# Patient Record
Sex: Male | Born: 1955
Health system: Southern US, Community
[De-identification: ages and names within clinical notes are randomized; demographics above are authoritative.]

## PROBLEM LIST (undated history)

## (undated) DIAGNOSIS — N401 Enlarged prostate with lower urinary tract symptoms: Secondary | ICD-10-CM

## (undated) DIAGNOSIS — F1011 Alcohol abuse, in remission: Secondary | ICD-10-CM

## (undated) DIAGNOSIS — D649 Anemia, unspecified: Secondary | ICD-10-CM

## (undated) DIAGNOSIS — Z87898 Personal history of other specified conditions: Secondary | ICD-10-CM

## (undated) DIAGNOSIS — F209 Schizophrenia, unspecified: Secondary | ICD-10-CM

## (undated) DIAGNOSIS — Z8679 Personal history of other diseases of the circulatory system: Secondary | ICD-10-CM

---

## 2000-03-21 ENCOUNTER — Emergency Department (HOSPITAL_COMMUNITY): Admission: EM | Admit: 2000-03-21 | Discharge: 2000-03-21 | Payer: Self-pay | Admitting: Emergency Medicine

## 2000-03-22 ENCOUNTER — Emergency Department (HOSPITAL_COMMUNITY): Admission: EM | Admit: 2000-03-22 | Discharge: 2000-03-22 | Payer: Self-pay | Admitting: Emergency Medicine

## 2010-03-20 ENCOUNTER — Emergency Department (HOSPITAL_COMMUNITY)
Admission: EM | Admit: 2010-03-20 | Discharge: 2010-03-21 | Disposition: A | Discharge status: Other Acute Inpatient Hospital | Payer: Self-pay | Source: Home / Self Care | Admitting: Emergency Medicine

## 2010-03-21 ENCOUNTER — Inpatient Hospital Stay (HOSPITAL_COMMUNITY): Admission: EM | Admit: 2010-03-21 | Discharge: 2010-03-25 | Payer: Self-pay | Admitting: Psychiatry

## 2010-03-21 ENCOUNTER — Ambulatory Visit: Payer: Self-pay | Admitting: Psychiatry

## 2010-05-08 ENCOUNTER — Emergency Department (HOSPITAL_COMMUNITY)
Admission: EM | Admit: 2010-05-08 | Discharge: 2010-05-08 | Payer: Self-pay | Source: Home / Self Care | Admitting: Emergency Medicine

## 2010-09-10 LAB — URINALYSIS, ROUTINE W REFLEX MICROSCOPIC
Bilirubin Urine: NEGATIVE
Glucose, UA: NEGATIVE mg/dL
Hgb urine dipstick: NEGATIVE
Ketones, ur: NEGATIVE mg/dL
Nitrite: NEGATIVE
Specific Gravity, Urine: 1.021 (ref 1.005–1.030)
pH: 5 (ref 5.0–8.0)

## 2010-09-10 LAB — BASIC METABOLIC PANEL
BUN: 14 mg/dL (ref 6–23)
Calcium: 9.6 mg/dL (ref 8.4–10.5)
GFR calc non Af Amer: >60 mL/min (ref >60)
Potassium: 4.2 mEq/L (ref 3.5–5.1)
Sodium: 140 mEq/L (ref 135–145)

## 2010-09-10 LAB — RAPID URINE DRUG SCREEN, HOSP PERFORMED
Benzodiazepines: NOT DETECTED
Cocaine: NOT DETECTED
Opiates: NOT DETECTED

## 2010-09-10 LAB — CBC
HCT: 38 % — ABNORMAL LOW (ref 39.0–52.0)
Hemoglobin: 13.4 g/dL (ref 13.0–17.0)
RDW: 12.8 % (ref 11.5–15.5)
WBC: 5.3 10*3/uL (ref 4.0–10.5)

## 2010-09-10 LAB — DIFFERENTIAL
Basophils Absolute: 0 10*3/uL (ref 0.0–0.1)
Basophils Relative: 0 % (ref 0–1)
Lymphocytes Relative: 40 % (ref 12–46)
Monocytes Absolute: 0.3 10*3/uL (ref 0.1–1.0)
Neutro Abs: 2.8 10*3/uL (ref 1.7–7.7)
Neutrophils Relative %: 53 % (ref 43–77)

## 2010-09-10 LAB — TRICYCLICS SCREEN, URINE: TCA Scrn: NOT DETECTED

## 2010-09-10 LAB — ETHANOL: Alcohol, Ethyl (B): <5 mg/dL (ref 0–10)

## 2010-09-12 LAB — COMPREHENSIVE METABOLIC PANEL
ALT: 27 U/L (ref 0–53)
AST: 50 U/L — ABNORMAL HIGH (ref 0–37)
CO2: 29 mEq/L (ref 19–32)
Calcium: 8.9 mg/dL (ref 8.4–10.5)
Creatinine, Ser: 1.28 mg/dL (ref 0.4–1.5)
GFR calc Af Amer: >60 mL/min (ref >60)
GFR calc non Af Amer: 59 mL/min — ABNORMAL LOW (ref >60)
Sodium: 142 mEq/L (ref 135–145)
Total Protein: 6.8 g/dL (ref 6.0–8.3)

## 2010-09-12 LAB — RAPID URINE DRUG SCREEN, HOSP PERFORMED
Amphetamines: NOT DETECTED
Benzodiazepines: NOT DETECTED
Cocaine: NOT DETECTED
Opiates: NOT DETECTED
Tetrahydrocannabinol: NOT DETECTED

## 2010-09-12 LAB — CBC
Hemoglobin: 13.2 g/dL (ref 13.0–17.0)
MCH: 32.1 pg (ref 26.0–34.0)
MCHC: 33.7 g/dL (ref 30.0–36.0)
Platelets: 169 10*3/uL (ref 150–400)
RDW: 12.4 % (ref 11.5–15.5)

## 2010-09-12 LAB — ETHANOL: Alcohol, Ethyl (B): <5 mg/dL (ref 0–10)

## 2010-09-12 LAB — DIFFERENTIAL
Eosinophils Relative: 1 % (ref 0–5)
Lymphocytes Relative: 38 % (ref 12–46)
Lymphs Abs: 2 10*3/uL (ref 0.7–4.0)
Monocytes Relative: 10 % (ref 3–12)
Neutrophils Relative %: 51 % (ref 43–77)

## 2015-11-12 DIAGNOSIS — S40011A Contusion of right shoulder, initial encounter: Secondary | ICD-10-CM | POA: Diagnosis not present

## 2015-11-15 DIAGNOSIS — S46011A Strain of muscle(s) and tendon(s) of the rotator cuff of right shoulder, initial encounter: Secondary | ICD-10-CM | POA: Diagnosis not present

## 2015-11-22 DIAGNOSIS — M25511 Pain in right shoulder: Secondary | ICD-10-CM | POA: Diagnosis not present

## 2015-12-03 DIAGNOSIS — M25511 Pain in right shoulder: Secondary | ICD-10-CM | POA: Diagnosis not present

## 2015-12-14 DIAGNOSIS — S46011A Strain of muscle(s) and tendon(s) of the rotator cuff of right shoulder, initial encounter: Secondary | ICD-10-CM | POA: Diagnosis not present

## 2015-12-15 ENCOUNTER — Observation Stay (HOSPITAL_COMMUNITY)
Admission: EM | Admit: 2015-12-15 | Discharge: 2015-12-16 | Disposition: A | Discharge status: Home / Self Care | Payer: Medicare Other | Attending: Cardiology | Admitting: Cardiology

## 2015-12-15 ENCOUNTER — Other Ambulatory Visit: Payer: Self-pay

## 2015-12-15 ENCOUNTER — Emergency Department (HOSPITAL_COMMUNITY): Payer: Medicare Other

## 2015-12-15 ENCOUNTER — Encounter (HOSPITAL_COMMUNITY): Payer: Self-pay | Admitting: Emergency Medicine

## 2015-12-15 DIAGNOSIS — E876 Hypokalemia: Secondary | ICD-10-CM | POA: Insufficient documentation

## 2015-12-15 DIAGNOSIS — F209 Schizophrenia, unspecified: Secondary | ICD-10-CM | POA: Diagnosis not present

## 2015-12-15 DIAGNOSIS — M79601 Pain in right arm: Secondary | ICD-10-CM

## 2015-12-15 DIAGNOSIS — I480 Paroxysmal atrial fibrillation: Secondary | ICD-10-CM | POA: Diagnosis not present

## 2015-12-15 DIAGNOSIS — I471 Supraventricular tachycardia: Principal | ICD-10-CM | POA: Insufficient documentation

## 2015-12-15 DIAGNOSIS — R079 Chest pain, unspecified: Secondary | ICD-10-CM | POA: Diagnosis not present

## 2015-12-15 DIAGNOSIS — Z9181 History of falling: Secondary | ICD-10-CM | POA: Insufficient documentation

## 2015-12-15 LAB — I-STAT CHEM 8, ED
BUN: 15 mg/dL (ref 6–20)
CALCIUM ION: 1.15 mmol/L (ref 1.13–1.30)
CHLORIDE: 102 mmol/L (ref 101–111)
Creatinine, Ser: 1 mg/dL (ref 0.61–1.24)
GLUCOSE: 90 mg/dL (ref 65–99)
HCT: 44 % (ref 39.0–52.0)
Hemoglobin: 15 g/dL (ref 13.0–17.0)
Potassium: 3.5 mmol/L (ref 3.5–5.1)
Sodium: 140 mmol/L (ref 135–145)
TCO2: 27 mmol/L (ref 0–100)

## 2015-12-15 LAB — I-STAT TROPONIN, ED: Troponin i, poc: 0 ng/mL (ref 0.00–0.08)

## 2015-12-15 LAB — CBC
HEMATOCRIT: 39.2 % (ref 39.0–52.0)
Hemoglobin: 13.3 g/dL (ref 13.0–17.0)
MCH: 32.4 pg (ref 26.0–34.0)
MCHC: 33.9 g/dL (ref 30.0–36.0)
MCV: 95.4 fL (ref 78.0–100.0)
Platelets: 165 10*3/uL (ref 150–400)
RBC: 4.11 MIL/uL — ABNORMAL LOW (ref 4.22–5.81)
RDW: 13 % (ref 11.5–15.5)
WBC: 6.4 10*3/uL (ref 4.0–10.5)

## 2015-12-15 LAB — BASIC METABOLIC PANEL
Anion gap: 9 (ref 5–15)
BUN: 13 mg/dL (ref 6–20)
CHLORIDE: 103 mmol/L (ref 101–111)
CO2: 24 mmol/L (ref 22–32)
Calcium: 9.3 mg/dL (ref 8.9–10.3)
Creatinine, Ser: 1.04 mg/dL (ref 0.61–1.24)
GFR calc Af Amer: >60 mL/min (ref >60)
GFR calc non Af Amer: >60 mL/min (ref >60)
GLUCOSE: 91 mg/dL (ref 65–99)
POTASSIUM: 3.4 mmol/L — AB (ref 3.5–5.1)
Sodium: 136 mmol/L (ref 135–145)

## 2015-12-15 LAB — RAPID URINE DRUG SCREEN, HOSP PERFORMED
Amphetamines: NOT DETECTED
BARBITURATES: NOT DETECTED
Benzodiazepines: NOT DETECTED
Cocaine: NOT DETECTED
Opiates: NOT DETECTED
TETRAHYDROCANNABINOL: NOT DETECTED

## 2015-12-15 MED ORDER — ADENOSINE 6 MG/2ML IV SOLN
INTRAVENOUS | Status: AC
  Filled 2015-12-15: qty 6

## 2015-12-15 MED ORDER — METOPROLOL TARTRATE 5 MG/5ML IV SOLN
INTRAVENOUS | Status: AC
  Filled 2015-12-15: qty 5

## 2015-12-15 MED ORDER — DILTIAZEM HCL 25 MG/5ML IV SOLN
20.0000 mg | Freq: Once | INTRAVENOUS | Status: AC
  Administered 2015-12-15: 20 mg via INTRAVENOUS
  Filled 2015-12-15: qty 5

## 2015-12-15 MED ORDER — SODIUM CHLORIDE 0.9 % IV BOLUS (SEPSIS)
500.0000 mL | Freq: Once | INTRAVENOUS | Status: AC
Start: 2015-12-15 — End: 2015-12-15
  Administered 2015-12-15: 500 mL via INTRAVENOUS

## 2015-12-15 MED ORDER — METOPROLOL TARTRATE 5 MG/5ML IV SOLN: 5.0000 mg | Freq: Once | INTRAVENOUS | Status: DC

## 2015-12-15 MED ORDER — METOPROLOL TARTRATE 5 MG/5ML IV SOLN
5.0000 mg | Freq: Once | INTRAVENOUS | Status: AC
  Administered 2015-12-15: 5 mg via INTRAVENOUS

## 2015-12-15 NOTE — ED Notes (Signed)
Pt HR now 182

## 2015-12-15 NOTE — H&P (Addendum)
HPI: Patient is a 60 yo AA M with h/o schizophrenia who presented to the ED this evening with right arm pain after a fall 3 weeks ago (he slipped on his socks).  While in the waiting room he developed sudden onset palpitations and chest pressure.  Vitals revealed a heart rate of 194, but stable BP.  12 lead EKG showed a narrow-complex tachycardia.  Prior to adenosine being pushed, he spontaneously went into atrial fibrillation briefly and then normal sinus rhythm.  He was given IV metoprolol and diltiazem to hopes to maintain normal rhythm.  He had brief episodes of SVT with frequent PVCs and runs of bigeminy.  He was never unstable.  His chest pain and palpations resolved once sinus rhythm was restored. He continues to complain of right forearm pain.  He denies ever having episodes of palpitations of presyncope prior to this evening.  He has never had any cardiac work-up previously. Denies history of illicit drug use.  Quit EtOH in 1989. No new medications.  He fell walking in socks on a hardwood floor ~3 weeks ago.  Denies LOC.  He hurt his shoulder and right forearm.  He saw an orthopedic surgeon recently and had a steroid injection in his shoulder, which provided moderate relief.  His right arm continues to hurt, which is what brought him to the ED this evening.  Review of Systems: as per hpi, otherwise negative  Past medical history: none, per patient, but on chart review he has been hospitalized for schizophrenia in 2011  Home meds: none   No Known Allergies  Social History   Social History  . Marital Status: Married    Spouse Name: N/A  . Number of Children: N/A  . Years of Education: N/A   Occupational History  . Not on file.   Social History Main Topics  . Smoking status: Never Smoker   . Smokeless tobacco: Not on file  . Alcohol Use: Previous EtOH abuse, quite in 1989  . Drug Use: No  . Sexual Activity: Not on file   Other Topics Concern  . Not on file   Social  History Narrative  . No narrative on file    History reviewed. No pertinent family history.  PHYSICAL EXAM: Filed Vitals:   12/15/15 2200 12/15/15 2215  BP: 117/79 108/80  Pulse: 53 54  Temp:    Resp: 21 17   General:  Well appearing. No respiratory difficulty, appears younger than stated age HEENT: normal Neck: supple. no JVD.  Cor: PMI nondisplaced. Regular rate & rhythm. No rubs, gallops or murmurs. Lungs: clear Abdomen: soft, nontender, nondistended. No hepatosplenomegaly. No bruits or masses. Good bowel sounds. Extremities: no cyanosis, clubbing, rash, edema. No obvious deformity or trauma to right upper extremity Neuro: alert & oriented x 3, cranial nerves grossly intact. moves all 4 extremities w/o difficulty. Affect pleasant. 5/5 strength in UE bialterally, sensation intact, 2+ radial pulses bilaterally  ECG: multiple reviewed 1. HR 194, SVT, likely AVNRT, with diffuse ST depressions 2. Atrial fibrillation, hr 142 with frequent PVCs, resolution of ST depressions 3. NSR, normal 4. SVT with runs of bigeminy 5.  NSR with PAC  Results for orders placed or performed during the hospital encounter of 12/15/15 (from the past 24 hour(s))  I-stat troponin, ED     Status: None   Collection Time: 12/15/15  9:21 PM  Result Value Ref Range   Troponin i, poc 0.00 0.00 - 0.08 ng/mL   Comment 3  Basic metabolic panel     Status: Abnormal   Collection Time: 12/15/15  9:26 PM  Result Value Ref Range   Sodium 136 135 - 145 mmol/L   Potassium 3.4 (L) 3.5 - 5.1 mmol/L   Chloride 103 101 - 111 mmol/L   CO2 24 22 - 32 mmol/L   Glucose, Bld 91 65 - 99 mg/dL   BUN 13 6 - 20 mg/dL   Creatinine, Ser 1.611.04 0.61 - 1.24 mg/dL   Calcium 9.3 8.9 - 09.610.3 mg/dL   GFR calc non Af Amer >60 >60 mL/min   GFR calc Af Amer >60 >60 mL/min   Anion gap 9 5 - 15  CBC     Status: Abnormal   Collection Time: 12/15/15  9:26 PM  Result Value Ref Range   WBC 6.4 4.0 - 10.5 K/uL   RBC 4.11 (L) 4.22  - 5.81 MIL/uL   Hemoglobin 13.3 13.0 - 17.0 g/dL   HCT 04.539.2 40.939.0 - 81.152.0 %   MCV 95.4 78.0 - 100.0 fL   MCH 32.4 26.0 - 34.0 pg   MCHC 33.9 30.0 - 36.0 g/dL   RDW 91.413.0 78.211.5 - 95.615.5 %   Platelets 165 150 - 400 K/uL  I-Stat Chem 8, ED     Status: None   Collection Time: 12/15/15  9:41 PM  Result Value Ref Range   Sodium 140 135 - 145 mmol/L   Potassium 3.5 3.5 - 5.1 mmol/L   Chloride 102 101 - 111 mmol/L   BUN 15 6 - 20 mg/dL   Creatinine, Ser 2.131.00 0.61 - 1.24 mg/dL   Glucose, Bld 90 65 - 99 mg/dL   Calcium, Ion 0.861.15 5.781.13 - 1.30 mmol/L   TCO2 27 0 - 100 mmol/L   Hemoglobin 15.0 13.0 - 17.0 g/dL   HCT 46.944.0 62.939.0 - 52.852.0 %   Dg Chest Port 1 View  12/15/2015  CLINICAL DATA:  Chest pain with fluttering sensation in chest. Heart rate 195. EXAM: PORTABLE CHEST 1 VIEW COMPARISON:  None. FINDINGS: The heart size and mediastinal contours are within normal limits. Both lungs are clear. The visualized skeletal structures are unremarkable. IMPRESSION: No active disease. Electronically Signed   By: Burman NievesWilliam  Stevens M.D.   On: 12/15/2015 22:11     ASSESSMENT/PLAN:  1. Paroxsymal SVT, short R-P, possibly AVNRT. -- check TSH, u tox -- perform transthoracic echocardiogram -- trend troponins  -- consider ambulatory monitor and outpatient follow-up after overnight observation  2. Right arm pain s/p fall. Nothing notable on exam, pulses intact, good strength.  Very unlikely to be anginal, likely MSK trauma after fall.  Very low concern for fracture.  CK normal. Patient reports that he follows with an outpatient orthopedic surgeon -- outpatient follow-up, no further inpatient testing  -- acetaminophen PRN  Regular diet  Full code  Observation, cardiology team.

## 2015-12-15 NOTE — ED Provider Notes (Signed)
CSN: [id number]     Arrival date & time 12/15/15  1923 History   None    Chief Complaint  Patient presents with  . Arm Injury  . Tachycardia     (Consider location/radiation/quality/duration/timing/severity/associated sxs/prior Treatment) HPI He presented with right arm pain. He described a fall approximately 2 weeks ago. He reports that the arm has been aching on and off for days at a time. Patient was in triage awaiting evaluation when he approached the window stating he felt like his heart was racing. This is checked and heart rate was found to be significantly elevated. EKG obtained with heart rate of 194. Patient reports he did not have these symptoms when he came to the emergency department. He denies ever having similar symptoms. He denies chest pain or shortness of breath. He does feel that his heart is racing. He reports he was getting pain medications for his right arm. He indicated getting ibuprofen. He denies other medical history. He denies tobacco use, alcohol use or drug use. History reviewed. No pertinent past medical history. History reviewed. No pertinent past surgical history. History reviewed. No pertinent family history. Social History  Substance Use Topics  . Smoking status: Never Smoker   . Smokeless tobacco: None  . Alcohol Use: No    Review of Systems 10 Systems reviewed and are negative for acute change except as noted in the HPI.    Allergies  Review of patient's allergies indicates no known allergies.  Home Medications   Prior to Admission medications   Not on File   BP 108/80 mmHg  Pulse 61  Temp(Src) 98.1 F (36.7 C) (Oral)  Resp 21  Ht  (1.702 m)  Wt 177 lb 3 oz (80.372 kg)  BMI 27.75 kg/m2  SpO2 96% Physical Exam  Constitutional: He is oriented to person, place, and time. He appears well-developed and well-nourished. No distress.  HENT:  Head: Normocephalic and atraumatic.  Mouth/Throat: Oropharynx is clear and moist.  Eyes: EOM  are normal.  Neck: Neck supple.  Cardiovascular: Intact distal pulses.   Extreme tachycardia. No appreciable rub murmur gallop.  Pulmonary/Chest: Effort normal and breath sounds normal. No respiratory distress.  Abdominal: Soft. Bowel sounds are normal. He exhibits no distension. There is no tenderness.  Musculoskeletal: Normal range of motion. He exhibits no edema or tenderness.  No appreciable abnormality to the right upper extremity. No deformity. No erythema. Nose soft Tissue swelling or pain. Range of motion intact  Lower extremities, no peripheral edema no calf tenderness.  Neurological: He is alert and oriented to person, place, and time. He has normal strength. He exhibits normal muscle tone. Coordination normal. GCS eye subscore is 4. GCS verbal subscore is 5. GCS motor subscore is 6.  Skin: Skin is warm, dry and intact.  Psychiatric: He has a normal mood and affect.    ED Course  Procedures (including critical care time) CRITICAL CARE Performed by: Arby Barrette   Total critical care time: 30 minutes  Critical care time was exclusive of separately billable procedures and treating other patients.  Critical care was necessary to treat or prevent imminent or life-threatening deterioration.  Critical care was time spent personally by me on the following activities: development of treatment plan with patient and/or surrogate as well as nursing, discussions with consultants, evaluation of patient's response to treatment, examination of patient, obtaining history from patient or surrogate, ordering and performing treatments and interventions, ordering and review of laboratory studies, ordering and review of radiographic  studies, pulse oximetry and re-evaluation of patient's condition. Labs Review Labs Reviewed  BASIC METABOLIC PANEL - Abnormal; Notable for the following:    Potassium 3.4 (*)    All other components within normal limits  CBC - Abnormal; Notable for the following:     RBC 4.11 (*)    All other components within normal limits  URINE RAPID DRUG SCREEN, HOSP PERFORMED  I-STAT TROPOININ, ED  I-STAT CHEM 8, ED    Imaging Review Dg Chest Port 1 View  12/15/2015  CLINICAL DATA:  Chest pain with fluttering sensation in chest. Heart rate 195. EXAM: PORTABLE CHEST 1 VIEW COMPARISON:  None. FINDINGS: The heart size and mediastinal contours are within normal limits. Both lungs are clear. The visualized skeletal structures are unremarkable. IMPRESSION: No active disease. Electronically Signed   By: Burman NievesWilliam  Stevens M.D.   On: 12/15/2015 22:11   I have personally reviewed and evaluated these images and lab results as part of my medical decision-making.   EKG Interpretation   Date/Time:  Saturday December 15 2015 21:26:19 EDT Ventricular Rate:  176 PR Interval:    QRS Duration: 85 QT Interval:  231 QTC Calculation: 375 R Axis:   39 Text Interpretation:  Supraventricular tachycardia Multiple premature  complexes, vent & supraven Repolarization abnormality, prob rate related  Confirmed by Donnald GarrePfeiffer, MD, Lebron ConnersMarcy [phone]([id number]) on 12/15/2015 11:33:01 PM     4 EKGs interpreted in [place] by M.D. Seldon Barrell.  Consult: Cardiology, evaluated emergency department and admitted. MDM   Final diagnoses:  SVT (supraventricular tachycardia) (HCC)  Paroxysmal atrial fibrillation with rapid ventricular response (HCC)  Right arm pain   Patient first presented with arm pain that was suggestive of musculoskeletal pain. He abruptly developed SVT with rates in the 190s. Patient was brought back to room and placed on a monitor. His rhythm was variable at times he converted to a sinus rhythm. He however would then have accelerating of rhythm and several beats of QRS with appearance of interventricular conduction delay. He then would go on to have variable rates from 140s up to 200. Times this was consistent with atrial fibrillation/flutter. He was initially given Lopressor 5 mg IV while in sinus  rhythm, he did go on to an accelerated rhythm again. A second dose of Lopressor administered. Again he went through several episodes of converting to sinus rhythm and then back to tachydysrhythmia. These were not wide complex. Patient was given a Cardizem 20 mg bolus. He resumed sinus rhythm and has remained in sinus rhythm. During the treatment phase, patient reported he felt a bit of a pricking or boring sensation in his chest. He remained alert and appropriate without diaphoresis or respiratory distress.    Arby BarretteMarcy Shabrea Weldin, MD 12/15/15 [phone]

## 2015-12-15 NOTE — ED Notes (Signed)
Dr. Broadus JohnPfieffer at bedside. Pads placed on patient. Pt AAOX4, in NAD

## 2015-12-15 NOTE — ED Notes (Signed)
Sister Billy DresserConnie has called and wants to stress how much "she believes he is seeking pain medication."

## 2015-12-15 NOTE — ED Notes (Signed)
Pt initially converted to sinus rhythm. Pt now back to SVT at 128

## 2015-12-15 NOTE — ED Notes (Signed)
Patient arrives with complaint of right arm pain. States "it feels like something is scraping around in there". Endorses injury stating that he fell and landed on that arm 2 weeks ago. States he caught himself with an outstretched hand. Additionally states that he has right leg pain too and he believe the pains are connected. Explains that it feels like "something is crawling from my foot to my shoulder then down my arm and then back down to my foot".

## 2015-12-15 NOTE — ED Notes (Signed)
Pt now in Sinus rhythm.

## 2015-12-15 NOTE — ED Notes (Signed)
Patient now represents with complaint of fluttering sensation in chest. HR noted to be 195 at this time. Denies pain, but states he feels like his heart is racing.

## 2015-12-15 NOTE — ED Notes (Signed)
Dr. Broadus JohnPfieffer continues to be at bedside as the pt HR goes from 75 to 190 multiple times.

## 2015-12-16 ENCOUNTER — Other Ambulatory Visit: Payer: Self-pay | Admitting: Physician Assistant

## 2015-12-16 DIAGNOSIS — I471 Supraventricular tachycardia: Secondary | ICD-10-CM

## 2015-12-16 DIAGNOSIS — E876 Hypokalemia: Secondary | ICD-10-CM

## 2015-12-16 LAB — CK: Total CK: 169 U/L (ref 49–397)

## 2015-12-16 LAB — TROPONIN I: Troponin I: <0.03 ng/mL (ref <0.031)

## 2015-12-16 LAB — TSH: TSH: 3.849 u[IU]/mL (ref 0.350–4.500)

## 2015-12-16 MED ORDER — POTASSIUM CHLORIDE CRYS ER 20 MEQ PO TBCR
40.0000 meq | EXTENDED_RELEASE_TABLET | Freq: Once | ORAL | Status: AC
  Administered 2015-12-16: 40 meq via ORAL
  Filled 2015-12-16: qty 2

## 2015-12-16 MED ORDER — ACETAMINOPHEN 325 MG PO TABS: 650.0000 mg | ORAL_TABLET | Freq: Four times a day (QID) | ORAL | Status: DC | PRN

## 2015-12-16 NOTE — Discharge Summary (Signed)
Discharge Summary    Patient ID: Billy Boyd,  MRN: [id number], DOB/AGE: [birth year] 60 y.o.  Admit date: 12/15/2015 Discharge date: 12/16/2015  Primary Care Provider: No primary care provider on file. Primary Cardiologist: Dr. Johney Frameallred  Discharge Diagnoses    Active Problems:   SVT (supraventricular tachycardia) (HCC)   Hypokalemia    Diagnostic Studies/Procedures    N/A _____________   History of Present Illness & Hospital Course    Mr. Billy Boyd is a 60 y/o M with history of schizophrenia and prior alcohol use (quit 1989) who [facility]. He actually presented to the ER with right arm pain after a fall 3 weeks ago (he slipped on his socks), and developed SVT in the waiting room of the ER. He fell walking in socks on a hardwood floor ~3 weeks ago. Denies LOC.He hurt his shoulder and right forearm.He saw an orthopedic surgeon recently and had a steroid injection in his shoulder, which provided moderate relief.He continued to have right arm pain, prompting ER visit.   While in the waiting room he developed sudden onset palpitations and chest pressure.Vitals revealed a heart rate of 194, but stable BP.12 lead EKG showed a narrow-complex tachycardia. Prior to adenosine being pushed, he spontaneously went into atrial fibrillation briefly and then normal sinus rhythm. He was given IV metoprolol and diltiazem to hopes to maintain normal rhythm.He had brief episodes of SVT with frequent PVCs and runs of bigeminy. He was never unstable. His chest pain and palpations resolved once sinus rhythm was restored. With regard to his arm pain, there was nothing notable on exam, pulses intact, good strength, low concern for fracture. CK was normal. UDS negative. TSH wnl. Troponin was negative. His potassium was borderline low at 3.5 then follow-up 3.4. He was given a dose of potassium chloride. He was admitted for further monitoring. He had no further arrhytmhias  overnight. Given that this was the first episode, Dr. Johney FrameAllred did not recommend initiation of long-term medication at this time. He recommended outpatient echo and ETT with follow-up EP PA in 4-6 weeks. I have [facility] a [facility] scheduler requesting these appointments, and our office will call the patient with this information. Dr. Johney FrameAllred has seen and examined the patient today and feels he is stable for discharge. Would also consider repeating BMET in f/u to reassess if low K is chronic process. He was advised to increase potassium rich foods in diet.  He was also advised to f/u PCP for arm pain. Per nursing note, "his sister Junious DresserConnie has called and wants to stress how much "she believes he is seeking pain medication."  Consultants: N/A _____________  Discharge Vitals Blood pressure 123/83, pulse 53, temperature 98.8 F (37.1 C), temperature source Oral, resp. rate 20, height 5\' 7"  (1.702 m), weight 177 lb 3 oz (80.372 kg), SpO2 100 %.  Filed Weights   12/15/15 1936  Weight: 177 lb 3 oz (80.372 kg)    Labs & Radiologic Studies    CBC  Recent Labs  12/15/15 2126 12/15/15 2141  WBC 6.4  --   HGB 13.3 15.0  HCT 39.2 44.0  MCV 95.4  --   PLT 165  --    Basic Metabolic Panel  Recent Labs  12/15/15 2126 12/15/15 2141  NA 136 140  K 3.4* 3.5  CL 103 102  CO2 24  --   GLUCOSE 91 90  BUN 13 15  CREATININE 1.04 1.00  CALCIUM 9.3  --  Cardiac Enzymes  Recent Labs  12/16/15 0020 12/16/15 0553  CKTOTAL 169  --   TROPONINI <0.03 <0.03   Thyroid Function Tests  Recent Labs  12/16/15 0020  TSH 3.849   _____________  Dg Chest Port 1 View  12/15/2015  CLINICAL DATA:  Chest pain with fluttering sensation in chest. Heart rate 195. EXAM: PORTABLE CHEST 1 VIEW COMPARISON:  None. FINDINGS: The heart size and mediastinal contours are within normal limits. Both lungs are clear. The visualized skeletal structures are unremarkable. IMPRESSION: No active  disease. Electronically Signed   By: Burman Nieves M.D.   On: 12/15/2015 22:11   Disposition   Pt is being discharged home today in good condition.  Follow-up Plans & Appointments    Follow-up Information    Follow up with Primary Care Provider.   Why:  Please follow up with your primary doctor for your arm pain.      Follow up with Sheilah Pigeon, PA-C.   Specialty:  Cardiology   Why:  [facility] - Office will call you for your followup appointment. Call office if you have not heard back in 3 days.   Contact information:   [address] [place] [address] [phone]       Follow up with [facility].   Specialty:  Cardiology   Why:  The office will also be calling you to schedule a stress test and ultrasound of your heart.   Contact information:   [address], [address] [place] [address] [phone]     Discharge Instructions    Diet - low sodium heart healthy    Complete by:  As directed      Increase activity slowly    Complete by:  As directed   Your potassium level was borderline low in the hospital. Please increase dietary intake of potassium including bananas, squash, yogurt, white beans, sweet potatoes, leafy greens, and avocados.           Discharge Medications   There are no discharge medications for this patient.    Allergies:  No Known Allergies    Outstanding Labs/Studies   N/a  Duration of Discharge Encounter   Greater than 30 minutes including physician time.  Signed, Laurann Montana PA-C 12/16/2015, 10:32 AM    Hillis Range MD, [facility]

## 2015-12-16 NOTE — Progress Notes (Signed)
   SUBJECTIVE: The patient is doing well today.  At this time, he denies chest pain, shortness of breath, or any new concerns.         OBJECTIVE: Physical Exam: Filed Vitals:   12/15/15 2200 12/15/15 2215 12/15/15 2300 12/16/15 0000  BP: 117/79 108/80  123/83  Pulse: 53 54 61 53  Temp:    98.8 F (37.1 C)  TempSrc:    Oral  Resp: 21 17 21 20   Height:      Weight:      SpO2: 99% 99% 96% 100%    Intake/Output Summary (Last 24 hours) at 12/16/15 0955 Last data filed at 12/16/15 0730  Gross per 24 hour  Intake    740 ml  Output      0 ml  Net    740 ml    Telemetry reveals sinus rhythm  GEN- The patient is well appearing, alert and oriented x 3 today.   Head- normocephalic, atraumatic Eyes-  Sclera clear, conjunctiva pink Ears- hearing intact Oropharynx- clear Neck- supple,  Lungs- Clear to ausculation bilaterally, normal work of breathing Heart- Regular rate and rhythm, no murmurs, rubs or gallops, PMI not laterally displaced GI- soft, NT, ND, + BS Extremities- no clubbing, cyanosis, or edema Skin- no rash or lesion Neuro- strength and sensation are intact  LABS: Basic Metabolic Panel:  Recent Labs  16/03/9605/17/17 2126 12/15/15 2141  NA 136 140  K 3.4* 3.5  CL 103 102  CO2 24  --   GLUCOSE 91 90  BUN 13 15  CREATININE 1.04 1.00  CALCIUM 9.3  --    CBC:  Recent Labs  12/15/15 2126 12/15/15 2141  WBC 6.4  --   HGB 13.3 15.0  HCT 39.2 44.0  MCV 95.4  --   PLT 165  --    Cardiac Enzymes:  Recent Labs  12/16/15 0020 12/16/15 0553  CKTOTAL 169  --   TROPONINI <0.03 <0.03   Thyroid Function Tests:  Recent Labs  12/16/15 0020  TSH 3.849    ASSESSMENT AND PLAN:   1. SVT Short RP SVT First episode Would not initiate long term medicine at this time Outpatient echo Outpatient ETT (without imaging)  Ok to discharge Follow-up with primary care for arm pain (which brought him to ED) Follow-up with Francis Dowseenee Ursuy PA-C in 4-6 weeks   Hillis RangeJames  Shaely Gadberry, MD 12/16/2015 9:55 AM

## 2015-12-16 NOTE — Progress Notes (Signed)
Reviewed d/c instructions with pt. Pt still c/o "something crawling under skin" in R arm. Refusing to leave. MD paged. Will continue to monitor.

## 2016-01-08 ENCOUNTER — Telehealth: Payer: Self-pay | Admitting: Physician Assistant

## 2016-01-08 NOTE — Telephone Encounter (Signed)
Called pt and left message for pt to call back to update pt's Fm and medical Hx.

## 2016-01-17 ENCOUNTER — Emergency Department (HOSPITAL_COMMUNITY)
Admission: EM | Admit: 2016-01-17 | Discharge: 2016-01-17 | Disposition: A | Discharge status: Home / Self Care | Payer: Medicare Other | Attending: Emergency Medicine | Admitting: Emergency Medicine

## 2016-01-17 ENCOUNTER — Encounter (HOSPITAL_COMMUNITY): Payer: Self-pay | Admitting: Emergency Medicine

## 2016-01-17 ENCOUNTER — Emergency Department (HOSPITAL_COMMUNITY): Payer: Medicare Other

## 2016-01-17 DIAGNOSIS — E871 Hypo-osmolality and hyponatremia: Secondary | ICD-10-CM | POA: Diagnosis not present

## 2016-01-17 DIAGNOSIS — R079 Chest pain, unspecified: Secondary | ICD-10-CM | POA: Diagnosis not present

## 2016-01-17 DIAGNOSIS — R1084 Generalized abdominal pain: Secondary | ICD-10-CM | POA: Diagnosis not present

## 2016-01-17 DIAGNOSIS — R0789 Other chest pain: Secondary | ICD-10-CM | POA: Diagnosis not present

## 2016-01-17 LAB — CBC
HCT: 37 % — ABNORMAL LOW (ref 39.0–52.0)
HEMOGLOBIN: 13 g/dL (ref 13.0–17.0)
MCH: 33 pg (ref 26.0–34.0)
MCHC: 35.1 g/dL (ref 30.0–36.0)
MCV: 93.9 fL (ref 78.0–100.0)
PLATELETS: 181 10*3/uL (ref 150–400)
RBC: 3.94 MIL/uL — AB (ref 4.22–5.81)
RDW: 12.1 % (ref 11.5–15.5)
WBC: 5.5 10*3/uL (ref 4.0–10.5)

## 2016-01-17 LAB — BASIC METABOLIC PANEL
ANION GAP: 8 (ref 5–15)
BUN: 9 mg/dL (ref 6–20)
CALCIUM: 9.6 mg/dL (ref 8.9–10.3)
CHLORIDE: 96 mmol/L — AB (ref 101–111)
CO2: 25 mmol/L (ref 22–32)
CREATININE: 1.05 mg/dL (ref 0.61–1.24)
GFR calc non Af Amer: >60 mL/min (ref >60)
Glucose, Bld: 108 mg/dL — ABNORMAL HIGH (ref 65–99)
Potassium: 3.7 mmol/L (ref 3.5–5.1)
SODIUM: 129 mmol/L — AB (ref 135–145)

## 2016-01-17 LAB — I-STAT TROPONIN, ED: TROPONIN I, POC: 0.01 ng/mL (ref 0.00–0.08)

## 2016-01-17 MED ORDER — OMEPRAZOLE 20 MG PO CPDR: 20.0000 mg | DELAYED_RELEASE_CAPSULE | Freq: Every day | ORAL | Status: DC

## 2016-01-17 NOTE — Discharge Instructions (Signed)
Hyponatremia Hyponatremia is when the amount of salt (sodium) in your blood is too low. When sodium levels are low, your cells absorb extra water and they swell. The swelling happens throughout the body, but it mostly affects the brain. CAUSES This condition may be caused by:  Heart, kidney, or liver problems.  Thyroid problems.  Adrenal gland problems.  Metabolic conditions, such as syndrome of inappropriate antidiuretic hormone (SIADH).  Severe vomiting and diarrhea.  Certain medicines or illegal drugs.  Dehydration.  Drinking too much water.  Eating a diet that is low in sodium.  Large burns on your body.  Sweating. RISK FACTORS This condition is more likely to develop in people who:  Have long-term (chronic) kidney disease.  Have heart failure.  Have a medical condition that causes frequent or excessive diarrhea.  Have metabolic conditions, such as Addison disease or SIADH.  Take certain medicines that affect the sodium and fluid balance in the blood. Some of these medicine types include:  Diuretics.  NSAIDs.  Some opioid pain medicines.  Some antidepressants.  Some seizure prevention medicines. SYMPTOMS  Symptoms of this condition include:  Nausea and vomiting.  Confusion.  Lethargy.  Agitation.  Headache.  Seizures.  Unconsciousness.  Appetite loss.  Muscle weakness and cramping.  Feeling weak or light-headed.  Having a rapid heart rate.  Fainting, in severe cases. DIAGNOSIS This condition is diagnosed with a medical history and physical exam. You will also have other tests, including:  Blood tests.  Urine tests. TREATMENT Treatment for this condition depends on the cause. Treatment may include:  Fluids given through an IV tube that is inserted into one of your veins.  Medicines to correct the sodium imbalance. If medicines are causing the condition, the medicines will need to be adjusted.  Limiting water or fluid intake to  get the correct sodium balance. HOME CARE INSTRUCTIONS  Take medicines only as directed by your health care provider. Many medicines can make this condition worse. Talk with your health care provider about any medicines that you are currently taking.  Carefully follow a recommended diet as directed by your health care provider.  Carefully follow instructions from your health care provider about fluid restrictions.  Keep all follow-up visits as directed by your health care provider. This is important.  Do not drink alcohol. SEEK MEDICAL CARE IF:  You develop worsening nausea, fatigue, headache, confusion, or weakness.  Your symptoms go away and then return.  You have problems following the recommended diet. SEEK IMMEDIATE MEDICAL CARE IF:  You have a seizure.  You faint.  You have ongoing diarrhea or vomiting.   This information is not intended to replace advice given to you by your health care provider. Make sure you discuss any questions you have with your health care provider.   Document Released: 06/06/2002 Document Revised: 10/31/2014 Document Reviewed: 07/06/2014 Elsevier Interactive Patient Education 2016 [employer]  Nonspecific Chest Pain  Chest pain can be caused by many different conditions. There is always a chance that your pain could be related to something serious, such as a heart attack or a blood clot in your lungs. Chest pain can also be caused by conditions that are not life-threatening. If you have chest pain, it is very important to follow up with your health care provider. CAUSES  Chest pain can be caused by:  Heartburn.  Pneumonia or bronchitis.  Anxiety or stress.  Inflammation around your heart (pericarditis) or lung (pleuritis or pleurisy).  A blood clot in  your lung.  A collapsed lung (pneumothorax). It can develop suddenly on its own (spontaneous pneumothorax) or from trauma to the chest.  Shingles infection (varicella-zoster  virus).  Heart attack.  Damage to the bones, muscles, and cartilage that make up your chest wall. This can include:  Bruised bones due to injury.  Strained muscles or cartilage due to frequent or repeated coughing or overwork.  Fracture to one or more ribs.  Sore cartilage due to inflammation (costochondritis). RISK FACTORS  Risk factors for chest pain may include:  Activities that increase your risk for trauma or injury to your chest.  Respiratory infections or conditions that cause frequent coughing.  Medical conditions or overeating that can cause heartburn.  Heart disease or family history of heart disease.  Conditions or health behaviors that increase your risk of developing a blood clot.  Having had chicken pox (varicella zoster). SIGNS AND SYMPTOMS Chest pain can feel like:  Burning or tingling on the surface of your chest or deep in your chest.  Crushing, pressure, aching, or squeezing pain.  Dull or sharp pain that is worse when you move, cough, or take a deep breath.  Pain that is also felt in your back, neck, shoulder, or arm, or pain that spreads to any of these areas. Your chest pain may come and go, or it may stay constant. DIAGNOSIS Lab tests or other studies may be needed to find the cause of your pain. Your health care provider may have you take a test called an ambulatory ECG (electrocardiogram). An ECG records your heartbeat patterns at the time the test is performed. You may also have other tests, such as:  Transthoracic echocardiogram (TTE). During echocardiography, sound waves are used to create a picture of all of the heart structures and to look at how blood flows through your heart.  Transesophageal echocardiogram (TEE).This is a more advanced imaging test that obtains images from inside your body. It allows your health care provider to see your heart in finer detail.  Cardiac monitoring. This allows your health care provider to monitor your  heart rate and rhythm in real time.  Holter monitor. This is a portable device that records your heartbeat and can help to diagnose abnormal heartbeats. It allows your health care provider to track your heart activity for several days, if needed.  Stress tests. These can be done through exercise or by taking medicine that makes your heart beat more quickly.  Blood tests.  Imaging tests. TREATMENT  Your treatment depends on what is causing your chest pain. Treatment may include:  Medicines. These may include:  Acid blockers for heartburn.  Anti-inflammatory medicine.  Pain medicine for inflammatory conditions.  Antibiotic medicine, if an infection is present.  Medicines to dissolve blood clots.  Medicines to treat coronary artery disease.  Supportive care for conditions that do not require medicines. This may include:  Resting.  Applying heat or cold packs to injured areas.  Limiting activities until pain decreases. HOME CARE INSTRUCTIONS  If you were prescribed an antibiotic medicine, finish it all even if you start to feel better.  Avoid any activities that bring on chest pain.  Do not use any tobacco products, including cigarettes, chewing tobacco, or electronic cigarettes. If you need help quitting, ask your health care provider.  Do not drink alcohol.  Take medicines only as directed by your health care provider.  Keep all follow-up visits as directed by your health care provider. This is important. This includes any further  testing if your chest pain does not go away.  If heartburn is the cause for your chest pain, you may be told to keep your head raised (elevated) while sleeping. This reduces the chance that acid will go from your stomach into your esophagus.  Make lifestyle changes as directed by your health care provider. These may include:  Getting regular exercise. Ask your health care provider to suggest some activities that are safe for you.  Eating a  heart-healthy diet. A registered dietitian can help you to learn healthy eating options.  Maintaining a healthy weight.  Managing diabetes, if necessary.  Reducing stress. SEEK MEDICAL CARE IF:  Your chest pain does not go away after treatment.  You have a rash with blisters on your chest.  You have a fever. SEEK IMMEDIATE MEDICAL CARE IF:   Your chest pain is worse.  You have an increasing cough, or you cough up blood.  You have severe abdominal pain.  You have severe weakness.  You faint.  You have chills.  You have sudden, unexplained chest discomfort.  You have sudden, unexplained discomfort in your arms, back, neck, or jaw.  You have shortness of breath at any time.  You suddenly start to sweat, or your skin gets clammy.  You feel nauseous or you vomit.  You suddenly feel light-headed or dizzy.  Your heart begins to beat quickly, or it feels like it is skipping beats. These symptoms may represent a serious problem that is an emergency. Do not wait to see if the symptoms will go away. Get medical help right away. Call your local emergency services (911 in the [place] Do not drive yourself to the hospital.   This information is not intended to replace advice given to you by your health care provider. Make sure you discuss any questions you have with your health care provider.   Document Released: 03/26/2005 Document Revised: 07/07/2014 Document Reviewed: 01/20/2014 Elsevier Interactive Patient Education [employer]

## 2016-01-17 NOTE — ED Notes (Signed)
Pt here for further eval of right sided CP

## 2016-01-17 NOTE — ED Provider Notes (Addendum)
CSN: [id number]     Arrival date & time 01/17/16  1541 History   First MD Initiated Contact with Patient 01/17/16 2202     Chief Complaint  Patient presents with  . Chest Pain      Patient is a 60 y.o. male presenting with chest pain. The history is provided by the patient.  Chest Pain Associated symptoms: abdominal pain   Associated symptoms: no back pain, no dysphagia, no fever and no shortness of breath   Patient presents with chest pain. States had it for around a month. States he'll have pain in the abdomen but will also go up into his chest. States in the morning he has bad breath and has a bad taste in his throat. Around 2 months ago he fell and had a right shoulder injury. He had some physical therapy and injection for. Around one month ago he had some chest pain and was found to have a tachycardia. Does not smoke. Denies drug abuse. States his shoulder is doing better. No further episodes of tachycardia. No diaphoresis. No nausea vomiting. States he's been taking Motrin for the pain along the way.  History reviewed. No pertinent past medical history. History reviewed. No pertinent past surgical history. History reviewed. No pertinent family history. Social History  Substance Use Topics  . Smoking status: Never Smoker   . Smokeless tobacco: None  . Alcohol Use: No    Review of Systems  Constitutional: Negative for fever and appetite change.  HENT: Negative for drooling and trouble swallowing.   Respiratory: Negative for shortness of breath.   Cardiovascular: Positive for chest pain.  Gastrointestinal: Positive for abdominal pain.  Genitourinary: Negative for flank pain.  Musculoskeletal: Negative for back pain.       Right shoulder pain.  Skin: Negative for wound.  Neurological: Negative for light-headedness.  Hematological: Negative for adenopathy.  Psychiatric/Behavioral: Negative for confusion.      Allergies  Review of patient's allergies indicates no known  allergies.  Home Medications   Prior to Admission medications   Medication Sig Start Date End Date Taking? Authorizing Provider  benztropine (COGENTIN) 1 MG tablet Take 1 mg by mouth 2 (two) times daily.   Yes Historical Provider, MD  risperiDONE (RISPERDAL) 1 MG tablet Take 1 mg by mouth 2 (two) times daily.   Yes Historical Provider, MD  omeprazole (PRILOSEC) 20 MG capsule Take 1 capsule (20 mg total) by mouth daily. 01/17/16   Benjiman Core, MD   BP 130/108 mmHg  Pulse 47  Temp(Src) 98.8 F (37.1 C) (Oral)  Resp 14  SpO2 99% Physical Exam  Constitutional: He appears well-developed.  HENT:  Jaw deviated somewhat to right.  Eyes: EOM are normal.  Neck: Neck supple.  Cardiovascular: Normal rate.   Pulmonary/Chest: Effort normal.  Abdominal: Soft. There is no tenderness. There is no rebound.  Musculoskeletal: Normal range of motion. He exhibits no edema.  Neurological: He is alert.  Skin: Skin is warm.    ED Course  Procedures (including critical care time) Labs Review Labs Reviewed  BASIC METABOLIC PANEL - Abnormal; Notable for the following:    Sodium 129 (*)    Chloride 96 (*)    Glucose, Bld 108 (*)    All other components within normal limits  CBC - Abnormal; Notable for the following:    RBC 3.94 (*)    HCT 37.0 (*)    All other components within normal limits  I-STAT TROPOININ, ED    Imaging Review  Dg Chest 2 View  01/17/2016  CLINICAL DATA:  Acute midline chest pain EXAM: CHEST  2 VIEW COMPARISON:  12/15/2015 FINDINGS: The heart size and mediastinal contours are within normal limits. Both lungs are clear. The visualized skeletal structures are unremarkable. IMPRESSION: No active cardiopulmonary disease. Electronically Signed   By: Judie PetitM.  Shick M.D.   On: 01/17/2016 17:15   I have personally reviewed and evaluated these images and lab results as part of my medical decision-making.   EKG Interpretation   Date/Time:  Thursday January 17 2016 15:49:27  EDT Ventricular Rate:  68 PR Interval:  136 QRS Duration: 84 QT Interval:  350 QTC Calculation: 372 R Axis:   36 Text Interpretation:  Normal sinus rhythm Nonspecific T wave abnormality  Abnormal ECG Confirmed by Fayrene FearingJAMES  MD, MARK ([id number]) on 01/17/2016 3:55:51 PM      MDM   Final diagnoses:  Chest pain, unspecified chest pain type  Hyponatremia    Patient with chest pain. Also bad taste in throat and occasional abdominal pain. Doubt cardiac cause. X-ray and lab work reassuring. Mild hyponatremia. May have a component of GERD or gastritis with the NSAIDs he's been taking. Will stop the ibuprofen and do a short course of Prilosec. Will follow-up as needed.    Benjiman CoreNathan Jason Frisbee, MD 01/17/16 2231  After discharge patient stated that he wanted help with the mind mapping machine that was helping monitor his thoughts. Does have a history of some psychiatric disorders apparently is on Seroquel. Does not want to see psychiatry. I don't think he is at the point of being involuntarily committed at this time. Hopefully will follow-up with his psychiatrist.  States that he will return if he would like more help.  Benjiman CoreNathan Quincy Boy, MD 01/17/16 [id number]  Benjiman CoreNathan Breanna Mcdaniel, MD 01/17/16 2248

## 2016-01-27 NOTE — Progress Notes (Deleted)
Cardiology Office Note Date:  01/27/2016  Patient ID:  Billy Boyd, Billy Boyd 12/06/1955, MRN [id number] PCP:  No primary care provider on file.  Cardiologist: Dr. Johney Frame (new)  ***refresh   Chief Complaint: f/u hospital visit  History of Present Illness: Billy Boyd is a 60 y.o. male with history of schizophrenia and prior alcohol use (quit 1989) and new finding of SVT.  He was recently discharged from [facility] 12/16/15 where he originally went with c/o persistant R arm pain after a slip and fall accident.    While in the waiting room he developed sudden onset palpitations and chest pressure.Vitals revealed a heart rate of 194, but stable BP.12 lead EKG showed a narrow-complex tachycardia. Prior to adenosine being pushed, he spontaneously went into atrial fibrillation briefly and then normal sinus rhythm. He was given IV metoprolol and diltiazem to hopes to maintain normal rhythm.He had brief episodes of SVT with frequent PVCs and runs of bigeminy. He was never unstable. His chest pain and palpations resolved once sinus rhythm was restored. With regard to his arm pain, there was nothing notable on exam, pulses intact, good strength, low concern for fracture. CK was normal. UDS negative. TSH wnl. Troponin was negative. His potassium was borderline low at 3.5 then follow-up 3.4. He was given a dose of potassium chloride. He was admitted for further monitoring. He had no further arrhytmhias overnight. Given that this was the first episode, Dr. Johney Frame did not recommend initiation of long-term medication at this time. He recommended outpatient echo and ETT.  There is also mention in the discharge summary : Per nursing note, "his sister Junious Dresser has called and wants to stress how much "she believes he is seeking pain medication."  The patient comes today feeling ***   *** family hx No past medical history on file.  No past surgical history on file.   *** palpitations? *** symptoms? *** echo done?  Stress test? *** recheck BMET  Current Outpatient Prescriptions  Medication Sig Dispense Refill  . benztropine (COGENTIN) 1 MG tablet Take 1 mg by mouth 2 (two) times daily.    Marland Kitchen omeprazole (PRILOSEC) 20 MG capsule Take 1 capsule (20 mg total) by mouth daily. 14 capsule 0  . risperiDONE (RISPERDAL) 1 MG tablet Take 1 mg by mouth 2 (two) times daily.     No current facility-administered medications for this visit.     Allergies:   Review of patient's allergies indicates no known allergies.   Social History:  The patient  reports that he has never smoked. He does not have any smokeless tobacco history on file. He reports that he does not drink alcohol or use drugs.   Family History:  The patient's family history is not on file.  ROS:  Please see the history of present illness.  All other systems are reviewed and otherwise negative.   PHYSICAL EXAM: *** VS:  There were no vitals taken for this visit. BMI: There is no height or weight on file to calculate BMI. Well nourished, well developed, in no acute distress  HEENT: normocephalic, atraumatic  Neck: no JVD, carotid bruits or masses Cardiac:  normal S1, S2; RRR; no significant murmurs, no rubs, or gallops Lungs:  clear to auscultation bilaterally, no wheezing, rhonchi or rales  Abd: soft, nontender MS: no deformity or atrophy Ext: no edema  Skin: warm and dry, no rash Neuro:  No gross deficits appreciated Psych: euthymic mood, full affect   EKG:  Done today and reviewed by myself  shows ***  Recent Labs: 12/16/2015: TSH 3.849 01/17/2016: BUN 9; Creatinine, Ser 1.05; Hemoglobin 13.0; Platelets 181; Potassium 3.7; Sodium 129  No results found for requested labs within last 8760 hours.   CrCl cannot be calculated (Unknown ideal weight.).   Wt Readings from Last 3 Encounters:  12/15/15 177 lb 3 oz (80.4 kg)     Other studies reviewed: Additional studies/records reviewed today include: summarized above  ASSESSMENT AND  PLAN:  1. Short RP SVT     ***Single known episode, no associated near syncope or syncope     If this become recurrent would consider procedure or medical therapy     ***Will schedule echo and stress test as recommended by Dr. Johney Frame in the hospital  Disposition: F/u with ***  Current medicines are reviewed at length with the patient today.  The patient did not have any concerns regarding medicines.***  Signed, Sherrilee Gilles, PA-C 01/27/2016 4:40 PM     [facility] [address] [place] [address] [phone] (office)  [phone] (fax)

## 2016-01-28 ENCOUNTER — Encounter: Payer: Medicare Other | Admitting: Physician Assistant

## 2016-01-28 DIAGNOSIS — R0989 Other specified symptoms and signs involving the circulatory and respiratory systems: Secondary | ICD-10-CM

## 2016-02-06 ENCOUNTER — Encounter: Payer: Self-pay | Admitting: Physician Assistant

## 2016-02-13 DIAGNOSIS — R351 Nocturia: Secondary | ICD-10-CM | POA: Diagnosis not present

## 2016-02-13 DIAGNOSIS — N401 Enlarged prostate with lower urinary tract symptoms: Secondary | ICD-10-CM | POA: Diagnosis not present

## 2016-02-13 DIAGNOSIS — R972 Elevated prostate specific antigen [PSA]: Secondary | ICD-10-CM | POA: Diagnosis not present

## 2016-03-04 ENCOUNTER — Telehealth: Payer: Self-pay | Admitting: *Deleted

## 2016-03-04 NOTE — Telephone Encounter (Signed)
I have left several message for this patient to call our office to schedule there echocardiogram and gxt,I will sent a message to Billy Boyd and remove the order.

## 2017-01-27 ENCOUNTER — Emergency Department (HOSPITAL_COMMUNITY): Payer: Medicare Other

## 2017-01-27 ENCOUNTER — Encounter (HOSPITAL_COMMUNITY): Payer: Self-pay | Admitting: Emergency Medicine

## 2017-01-27 ENCOUNTER — Emergency Department (HOSPITAL_COMMUNITY)
Admission: EM | Admit: 2017-01-27 | Discharge: 2017-01-27 | Disposition: A | Discharge status: Home / Self Care | Payer: Medicare Other | Attending: Emergency Medicine | Admitting: Emergency Medicine

## 2017-01-27 DIAGNOSIS — Z79899 Other long term (current) drug therapy: Secondary | ICD-10-CM | POA: Diagnosis not present

## 2017-01-27 DIAGNOSIS — R42 Dizziness and giddiness: Secondary | ICD-10-CM | POA: Diagnosis not present

## 2017-01-27 DIAGNOSIS — R404 Transient alteration of awareness: Secondary | ICD-10-CM | POA: Diagnosis not present

## 2017-01-27 DIAGNOSIS — I471 Supraventricular tachycardia: Secondary | ICD-10-CM | POA: Diagnosis not present

## 2017-01-27 DIAGNOSIS — R55 Syncope and collapse: Secondary | ICD-10-CM | POA: Diagnosis not present

## 2017-01-27 DIAGNOSIS — S0990XA Unspecified injury of head, initial encounter: Secondary | ICD-10-CM | POA: Diagnosis not present

## 2017-01-27 LAB — URINALYSIS, ROUTINE W REFLEX MICROSCOPIC
Bilirubin Urine: NEGATIVE
Glucose, UA: NEGATIVE mg/dL
HGB URINE DIPSTICK: NEGATIVE
Ketones, ur: NEGATIVE mg/dL
Leukocytes, UA: NEGATIVE
NITRITE: NEGATIVE
PROTEIN: NEGATIVE mg/dL
SPECIFIC GRAVITY, URINE: 1.012 (ref 1.005–1.030)
pH: 6 (ref 5.0–8.0)

## 2017-01-27 LAB — BASIC METABOLIC PANEL
ANION GAP: 5 (ref 5–15)
BUN: 17 mg/dL (ref 6–20)
CALCIUM: 9 mg/dL (ref 8.9–10.3)
CO2: 29 mmol/L (ref 22–32)
Chloride: 103 mmol/L (ref 101–111)
Creatinine, Ser: 1.26 mg/dL — ABNORMAL HIGH (ref 0.61–1.24)
GFR, EST NON AFRICAN AMERICAN: 60 mL/min — AB (ref >60)
GLUCOSE: 127 mg/dL — AB (ref 65–99)
Potassium: 4 mmol/L (ref 3.5–5.1)
Sodium: 137 mmol/L (ref 135–145)

## 2017-01-27 LAB — CBG MONITORING, ED: GLUCOSE-CAPILLARY: 110 mg/dL — AB (ref 65–99)

## 2017-01-27 LAB — CBC
HCT: 40.1 % (ref 39.0–52.0)
HEMOGLOBIN: 14 g/dL (ref 13.0–17.0)
MCH: 33.8 pg (ref 26.0–34.0)
MCHC: 34.9 g/dL (ref 30.0–36.0)
MCV: 96.9 fL (ref 78.0–100.0)
PLATELETS: 166 10*3/uL (ref 150–400)
RBC: 4.14 MIL/uL — ABNORMAL LOW (ref 4.22–5.81)
RDW: 12.8 % (ref 11.5–15.5)
WBC: 5.2 10*3/uL (ref 4.0–10.5)

## 2017-01-27 MED ORDER — SODIUM CHLORIDE 0.9 % IV BOLUS (SEPSIS)
500.0000 mL | Freq: Once | INTRAVENOUS | Status: AC
  Administered 2017-01-27: 500 mL via INTRAVENOUS

## 2017-01-27 NOTE — ED Triage Notes (Signed)
Pt BIB EMS from home. Had syncopal episode tonight while attempting to use the bathroom. For EMS, pt initially diaphoretic with a BP of 70/40 and a HR in the 40s. Received NS en route. BP & HR improved. Alert & oriented throughout. No hx of similar episodes. No pain complaint or obvious injury.

## 2017-01-27 NOTE — ED Notes (Signed)
Pt. Ambulated unassisted in hallway. Pt. States his left ankles hurts a little, but otherwise has no complaints

## 2017-01-27 NOTE — Discharge Instructions (Signed)
We suspect that his passing out is related to having a bowel movement and blood pooling in the legs.  Drink plenty of fluids and eat well. Follow-up with your PCP for re-check in one week.  Return without fail for worsening symptoms, including recurrent passing out, chest pain, difficulty breathing, confusion, difficulty walking, or any  other symptoms concerning to you.

## 2017-01-27 NOTE — ED Notes (Signed)
Pt. Taken off the floor before d/c. Vitals copied from that time.

## 2017-01-27 NOTE — ED Notes (Signed)
Patient transported to CT 

## 2017-01-27 NOTE — ED Provider Notes (Signed)
[facility]-EMER[facility]ENCY DEPT Provider Note   CSN: [id number] Arrival date & time: 01/27/17  0551     History   Chief Complaint Chief Complaint  Patient presents with  . Loss of Consciousness    HPI Billy Boyd is a [age] y.o. male.  The history is provided by the patient.  Loss of Consciousness   This is a new problem. The current episode started 1 to 2 hours ago. The problem occurs rarely. The problem has been resolved. He lost consciousness for a period of less than one minute. The problem is associated with standing up and bowel movements. Associated symptoms include diaphoresis and light-headedness. Pertinent negatives include abdominal pain, back pain, bladder incontinence, bowel incontinence, chest pain, fever, focal sensory loss, focal weakness, headaches, malaise/fatigue, nausea, palpitations and vomiting. He has tried nothing for the symptoms.   61 year old male who presents with syncope. History of schizophrenia and SVT. No prior history of syncope. States that he got up from bed to use the restroom tonight and was feeling dizzy while having a bowel movement. States that when he got up, he passed out. Denies any associating chest pain, abdominal pain, back pain, headaches or neurological complaints, shortness of breath. EMS was called, initially patient seemed sweaty, hypotensive with systolic blood pressure of 70/40, and a heart rate in the 40s. He did receive 500 mL of normal saline. On arrival, patient states that he feels at baseline. He denies any recent illnesses including vomiting or diarrhea, melena or hematochezia, fevers or chills.  History reviewed. No pertinent past medical history.  Patient Active Problem List   Diagnosis Date Noted  . Hypokalemia 12/16/2015  . SVT (supraventricular tachycardia) (HCC) 12/15/2015    History reviewed. No pertinent surgical history.     Home Medications    Prior to Admission medications   Medication Sig Start Date End Date Taking?  Authorizing Provider  benztropine (COGENTIN) 1 MG tablet Take 1 mg by mouth 2 (two) times daily.   Yes [provider]  risperiDONE (RISPERDAL) 1 MG tablet Take 1 mg by mouth 2 (two) times daily.   Yes [provider]  omeprazole (PRILOSEC) 20 MG capsule Take 1 capsule (20 mg total) by mouth daily. Patient not taking: Reported on 01/27/2017 01/17/16   Benjiman CorePickering, Nathan, MD    Family History History reviewed. No pertinent family history.  Social History Social History  Substance Use Topics  . Smoking status: Never Smoker  . Smokeless tobacco: Never Used  . Alcohol use No     Allergies   Patient has no known allergies.   Review of Systems Review of Systems  Constitutional: Positive for diaphoresis. Negative for fever and malaise/fatigue.  Cardiovascular: Positive for syncope. Negative for chest pain and palpitations.  Gastrointestinal: Negative for abdominal pain, bowel incontinence, nausea and vomiting.  Genitourinary: Negative for bladder incontinence.  Musculoskeletal: Negative for back pain.  Neurological: Positive for light-headedness. Negative for focal weakness and headaches.  All other systems reviewed and are negative.    Physical Exam Updated Vital Signs BP 92/62   Pulse (!) 53   Temp 97.7 F (36.5 C) (Oral)   Resp 16   Ht 5\' 7"  (1.702 m)   Wt 81.6 kg (180 lb)   SpO2 100%   BMI 28.19 kg/m   Physical Exam Physical Exam  Nursing note and vitals reviewed. Constitutional: Well developed, well nourished, non-toxic, and in no acute distress Head: Normocephalic and atraumatic.  Mouth/Throat: Oropharynx is clear and moist.  Neck: Normal  range of motion. Neck supple.  Cardiovascular: Normal rate and regular rhythm.   Pulmonary/Chest: Effort normal and breath sounds normal.  Abdominal: Soft. There is no tenderness. There is no rebound and no guarding.  Musculoskeletal: Normal range of motion.  Neurological: Alert, no facial droop, fluent  speech, moves all extremities symmetrically, equal bilateral hand grips, PERRL, sensation to light touch in tact throughout Skin: Skin is warm and dry.  Psychiatric: Cooperative   ED Treatments / Results  Labs (all labs ordered are listed, but only abnormal results are displayed) Labs Reviewed  BASIC METABOLIC PANEL - Abnormal; Notable for the following:       Result Value   Glucose, Bld 127 (*)    Creatinine, Ser 1.26 (*)    GFR calc non Af Amer 60 (*)    All other components within normal limits  CBC - Abnormal; Notable for the following:    RBC 4.14 (*)    All other components within normal limits  CBG MONITORING, ED - Abnormal; Notable for the following:    Glucose-Capillary 110 (*)    All other components within normal limits  URINALYSIS, ROUTINE W REFLEX MICROSCOPIC    EKG  EKG Interpretation  Date/Time:  Tuesday January 27 2017 06:05:28 EDT Ventricular Rate:  65 PR Interval:    QRS Duration: 100 QT Interval:  401 QTC Calculation: 417 R Axis:   61 Text Interpretation:  Sinus rhythm No acute changes Confirmed by Crista Curb [phone]) on 01/27/2017 7:03:33 AM       Radiology Ct Head Wo Contrast  Result Date: 01/27/2017 CLINICAL DATA:  Hit his head during a fall. EXAM: CT HEAD WITHOUT CONTRAST TECHNIQUE: Contiguous axial images were obtained from the base of the skull through the vertex without intravenous contrast. COMPARISON:  CT head dated March 20, 2010. FINDINGS: Brain: No evidence of acute infarction, hemorrhage, hydrocephalus, extra-axial collection or mass lesion/mass effect. Vascular: No hyperdense vessel or unexpected calcification. Skull: Normal. Negative for fracture or focal lesion. Sinuses/Orbits: The bilateral paranasal sinuses and mastoid air cells are clear. The orbits are remarkable. Other: None. IMPRESSION: No acute intracranial abnormality. Electronically Signed   By: Obie Dredge M.D.   On: 01/27/2017 07:32    Procedures Procedures (including  critical care time)  Medications Ordered in ED Medications  sodium chloride 0.9 % bolus 500 mL (500 mLs Intravenous New Bag/Given 01/27/17 0732)     Initial Impression / Assessment and Plan / ED Course  I have reviewed the triage vital signs and the nursing notes.  Pertinent labs & imaging results that were available during my care of the patient were reviewed by me and considered in my medical decision making (see chart for details).     Presents with syncopal episode. Suspect that this related to combination of situational/vasovagal (bearing down with bowel movement) and orthostasis (feeling lightheaded after getting up from bed and toilet). He is well appearing, asymptomatic in the ED. EKG without stigmata of arrhythmia. Blood work with mild AKI, and given additional 500 cc of IVF. No major cardiac history of high risk features of syncope today. Remainder of blood work is reassuring. Felt stable for discharge home. Strict return and follow-up instructions reviewed with patient and his family. They expressed understanding of all discharge instructions and felt comfortable with the plan of care.   Final Clinical Impressions(s) / ED Diagnoses   Final diagnoses:  Syncope and collapse    New Prescriptions New Prescriptions   No medications on file  Lavera GuiseLiu, Imagene Boss Duo, MD 01/27/17 [phone]

## 2017-10-15 DIAGNOSIS — F29 Unspecified psychosis not due to a substance or known physiological condition: Secondary | ICD-10-CM | POA: Diagnosis not present

## 2017-10-21 DIAGNOSIS — F29 Unspecified psychosis not due to a substance or known physiological condition: Secondary | ICD-10-CM | POA: Diagnosis not present

## 2017-12-29 DIAGNOSIS — F209 Schizophrenia, unspecified: Secondary | ICD-10-CM | POA: Diagnosis not present

## 2017-12-29 DIAGNOSIS — Z1389 Encounter for screening for other disorder: Secondary | ICD-10-CM | POA: Diagnosis not present

## 2017-12-29 DIAGNOSIS — N400 Enlarged prostate without lower urinary tract symptoms: Secondary | ICD-10-CM | POA: Diagnosis not present

## 2018-01-29 DIAGNOSIS — N401 Enlarged prostate with lower urinary tract symptoms: Secondary | ICD-10-CM | POA: Diagnosis not present

## 2018-01-29 DIAGNOSIS — R350 Frequency of micturition: Secondary | ICD-10-CM | POA: Diagnosis not present

## 2018-01-29 DIAGNOSIS — R972 Elevated prostate specific antigen [PSA]: Secondary | ICD-10-CM | POA: Diagnosis not present

## 2018-03-30 DIAGNOSIS — R972 Elevated prostate specific antigen [PSA]: Secondary | ICD-10-CM | POA: Diagnosis not present

## 2018-03-30 DIAGNOSIS — N5201 Erectile dysfunction due to arterial insufficiency: Secondary | ICD-10-CM | POA: Diagnosis not present

## 2018-03-30 DIAGNOSIS — R3912 Poor urinary stream: Secondary | ICD-10-CM | POA: Diagnosis not present

## 2018-03-30 DIAGNOSIS — N401 Enlarged prostate with lower urinary tract symptoms: Secondary | ICD-10-CM | POA: Diagnosis not present

## 2018-04-14 DIAGNOSIS — F29 Unspecified psychosis not due to a substance or known physiological condition: Secondary | ICD-10-CM | POA: Diagnosis not present

## 2018-05-10 DIAGNOSIS — F209 Schizophrenia, unspecified: Secondary | ICD-10-CM | POA: Diagnosis not present

## 2018-05-10 DIAGNOSIS — Z0000 Encounter for general adult medical examination without abnormal findings: Secondary | ICD-10-CM | POA: Diagnosis not present

## 2018-05-10 DIAGNOSIS — N400 Enlarged prostate without lower urinary tract symptoms: Secondary | ICD-10-CM | POA: Diagnosis not present

## 2018-05-10 DIAGNOSIS — R7309 Other abnormal glucose: Secondary | ICD-10-CM | POA: Diagnosis not present

## 2018-05-10 DIAGNOSIS — Z136 Encounter for screening for cardiovascular disorders: Secondary | ICD-10-CM | POA: Diagnosis not present

## 2018-05-10 DIAGNOSIS — Z1389 Encounter for screening for other disorder: Secondary | ICD-10-CM | POA: Diagnosis not present

## 2018-05-10 DIAGNOSIS — Z1211 Encounter for screening for malignant neoplasm of colon: Secondary | ICD-10-CM | POA: Diagnosis not present

## 2018-05-10 DIAGNOSIS — Z23 Encounter for immunization: Secondary | ICD-10-CM | POA: Diagnosis not present

## 2018-05-14 DIAGNOSIS — R350 Frequency of micturition: Secondary | ICD-10-CM | POA: Diagnosis not present

## 2018-05-14 DIAGNOSIS — R351 Nocturia: Secondary | ICD-10-CM | POA: Diagnosis not present

## 2018-07-26 DIAGNOSIS — N401 Enlarged prostate with lower urinary tract symptoms: Secondary | ICD-10-CM | POA: Diagnosis not present

## 2018-07-26 DIAGNOSIS — R351 Nocturia: Secondary | ICD-10-CM | POA: Diagnosis not present

## 2018-08-31 DIAGNOSIS — N401 Enlarged prostate with lower urinary tract symptoms: Secondary | ICD-10-CM | POA: Diagnosis not present

## 2018-08-31 DIAGNOSIS — R351 Nocturia: Secondary | ICD-10-CM | POA: Diagnosis not present

## 2018-09-14 DIAGNOSIS — R351 Nocturia: Secondary | ICD-10-CM | POA: Diagnosis not present

## 2018-09-14 DIAGNOSIS — N401 Enlarged prostate with lower urinary tract symptoms: Secondary | ICD-10-CM | POA: Diagnosis not present

## 2018-09-14 DIAGNOSIS — R350 Frequency of micturition: Secondary | ICD-10-CM | POA: Diagnosis not present

## 2018-11-10 DIAGNOSIS — F209 Schizophrenia, unspecified: Secondary | ICD-10-CM | POA: Diagnosis not present

## 2018-11-10 DIAGNOSIS — R972 Elevated prostate specific antigen [PSA]: Secondary | ICD-10-CM | POA: Diagnosis not present

## 2018-11-10 DIAGNOSIS — N400 Enlarged prostate without lower urinary tract symptoms: Secondary | ICD-10-CM | POA: Diagnosis not present

## 2018-11-10 DIAGNOSIS — R7303 Prediabetes: Secondary | ICD-10-CM | POA: Diagnosis not present

## 2018-12-05 IMAGING — CT CT HEAD W/O CM
4 series · 16 of 47 positions shown, 18 images · non-contrast
Comparison: CT head dated March 20, 2010.

CLINICAL DATA: Hit his head during a fall.

EXAM:
CT HEAD WITHOUT CONTRAST
TECHNIQUE: Contiguous axial images were obtained from the base of the skull
through the vertex without intravenous contrast.

[Series 3: head wo · axial · 0.42mm/px · z∈[-102,+14]mm · 7 of 31 slices shown, 9 images]
[im 4/31  brain]
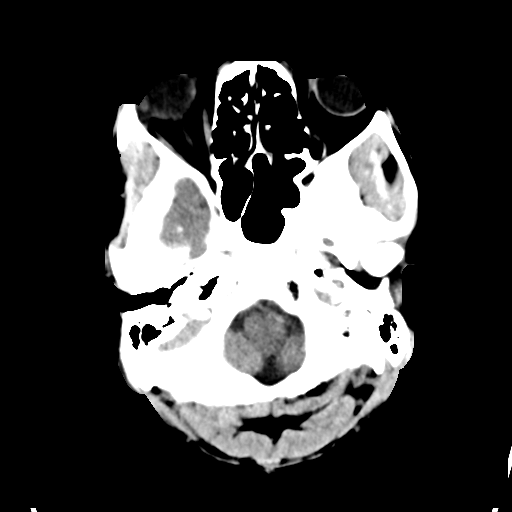
[im 4/31  bone]
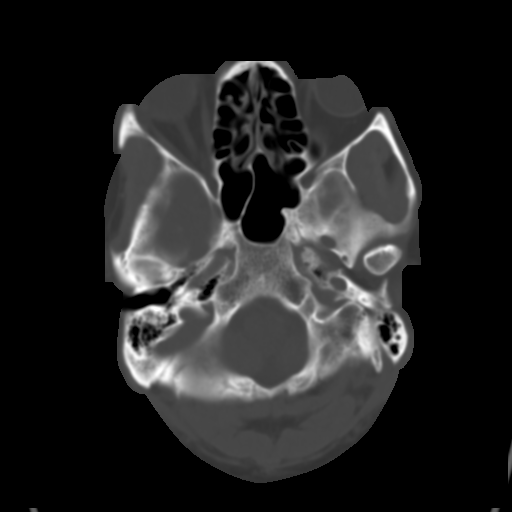
[im 8/31  brain]
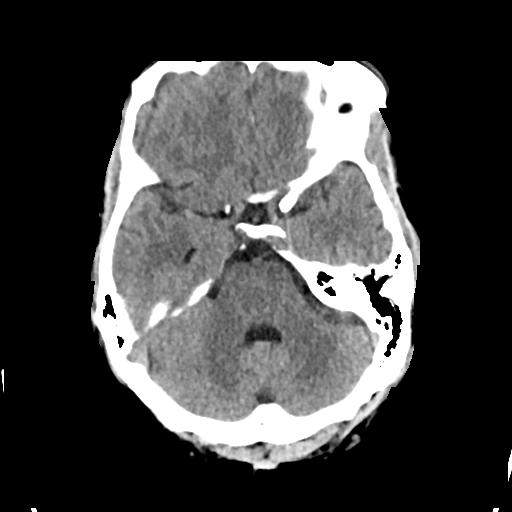
[im 12/31  brain]
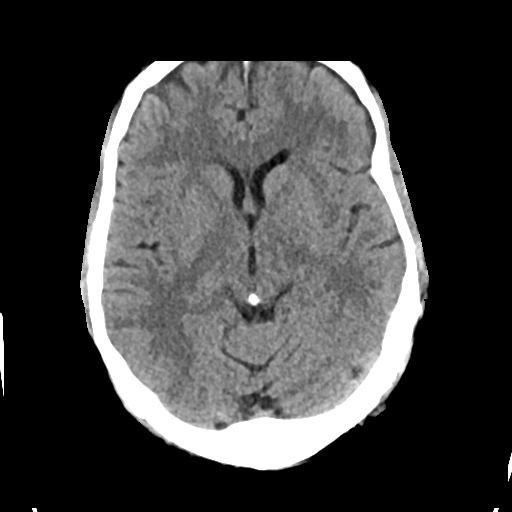
[im 16/31  brain]
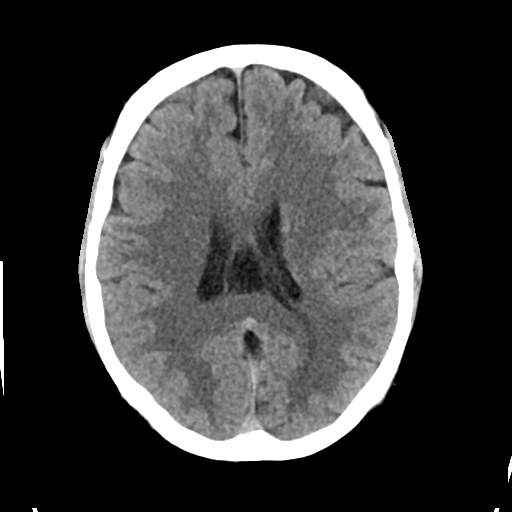
[im 19/31  brain]
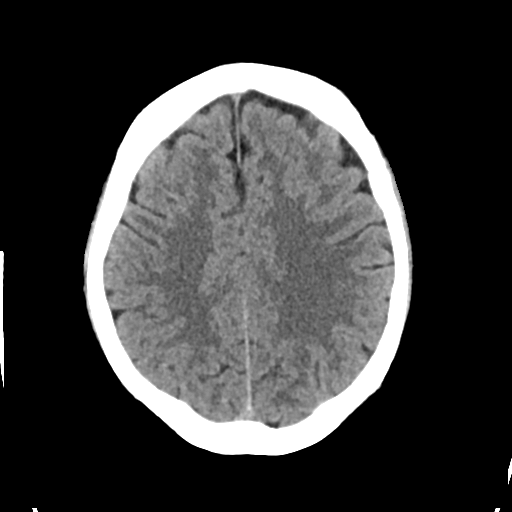
[im 19/31  bone]
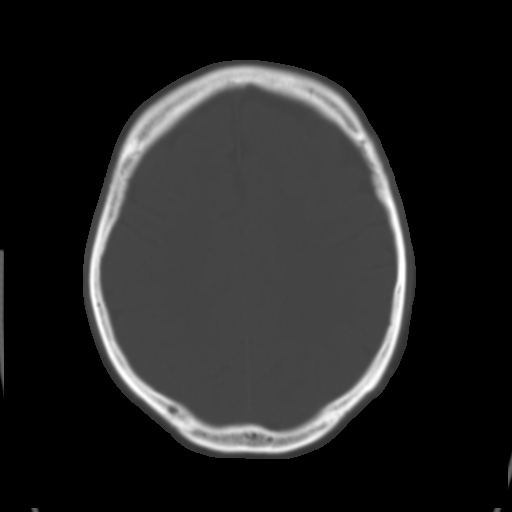
[im 23/31  brain]
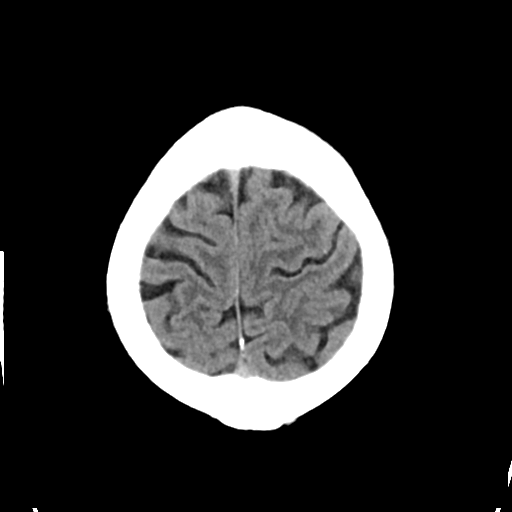
[im 27/31  brain]
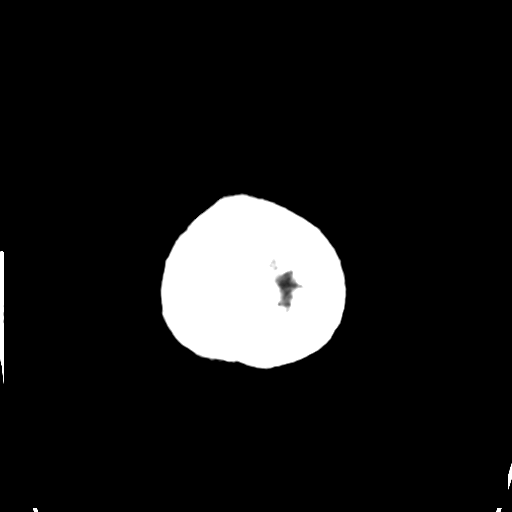

[Series 4: head bone · axial · 0.42mm/px · z∈[-102,-72]mm · 3 of 76 slices shown]
[im 8/76  bone]
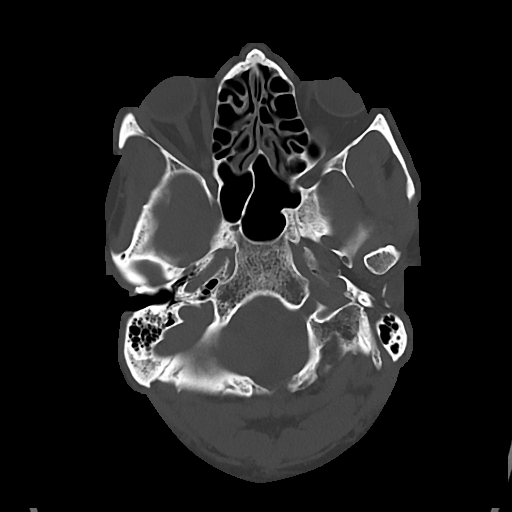
[im 16/76  bone]
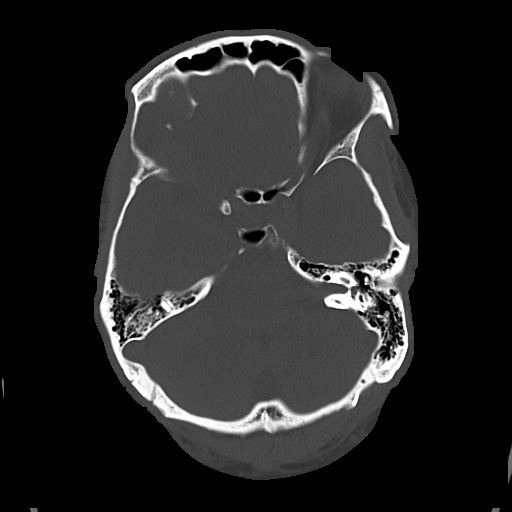
[im 23/76  bone]
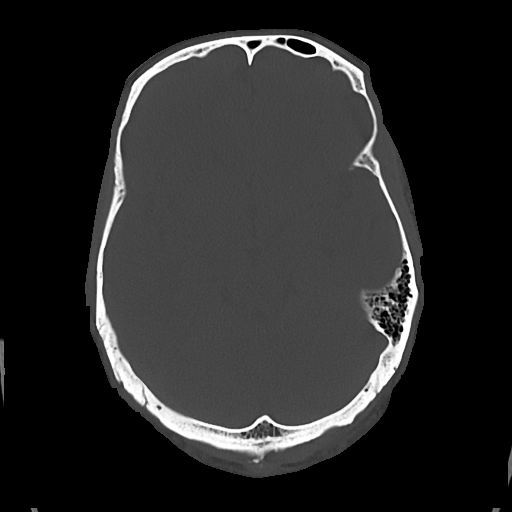

[Series 5: cor soft · coronal · 0.29mm/px · 3 of 68 slices shown]
[im 23/68  brain]
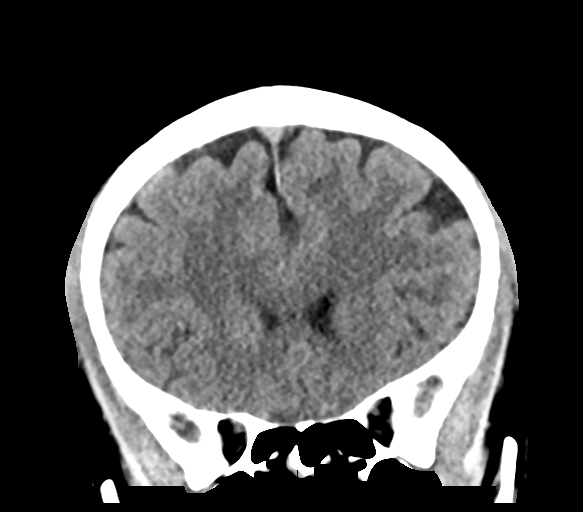
[im 30/68  brain]
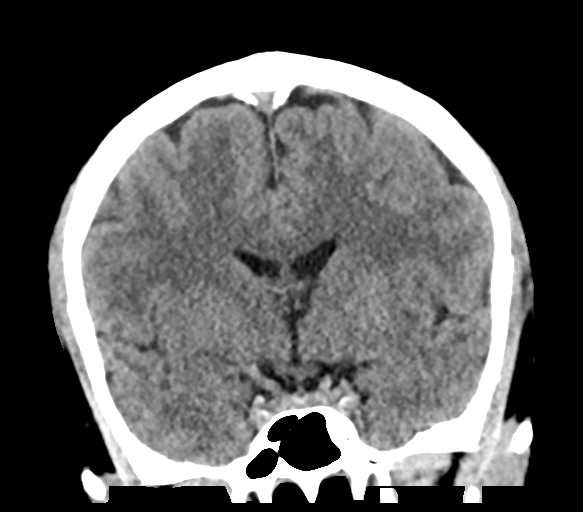
[im 38/68  brain]
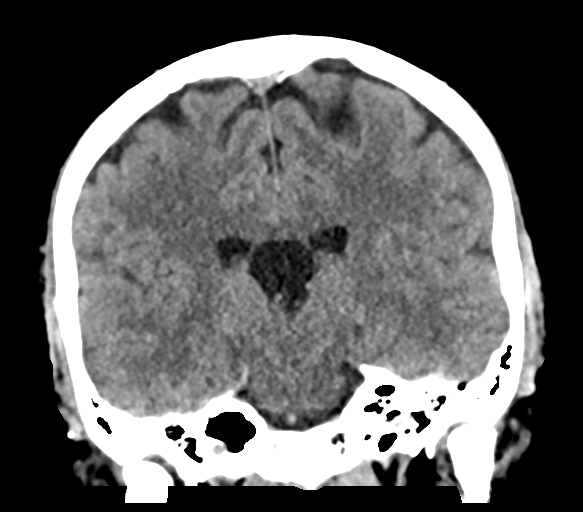

[Series 6: sag soft · sagittal · 0.29mm/px · 3 of 63 slices shown]
[im 23/63  brain]
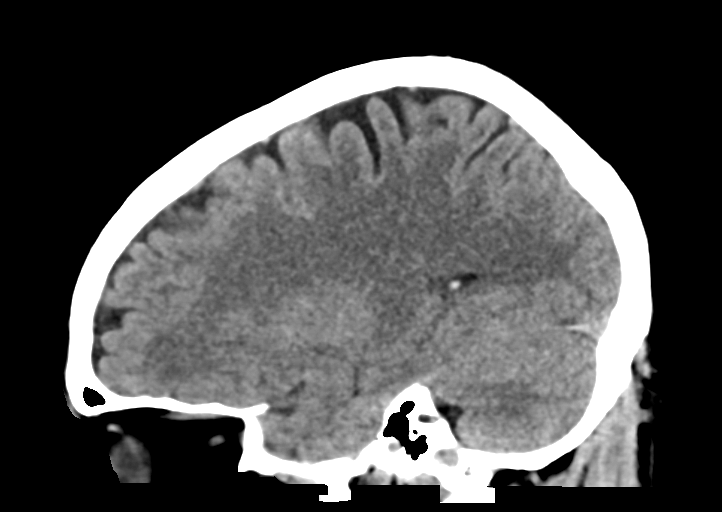
[im 32/63  brain]
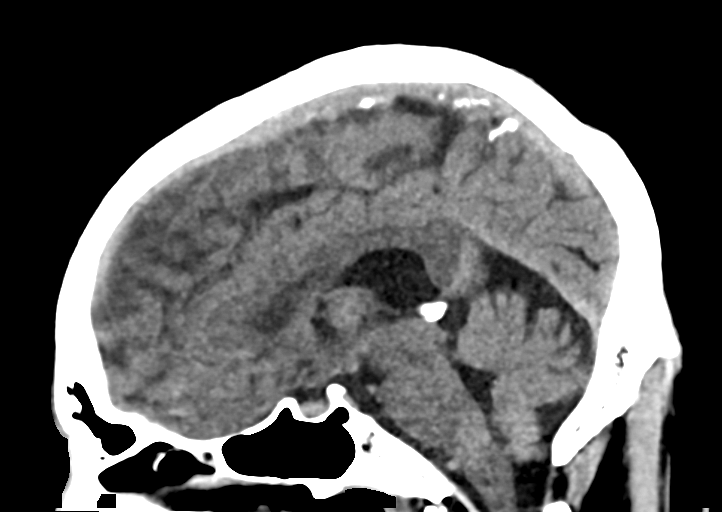
[im 40/63  brain]
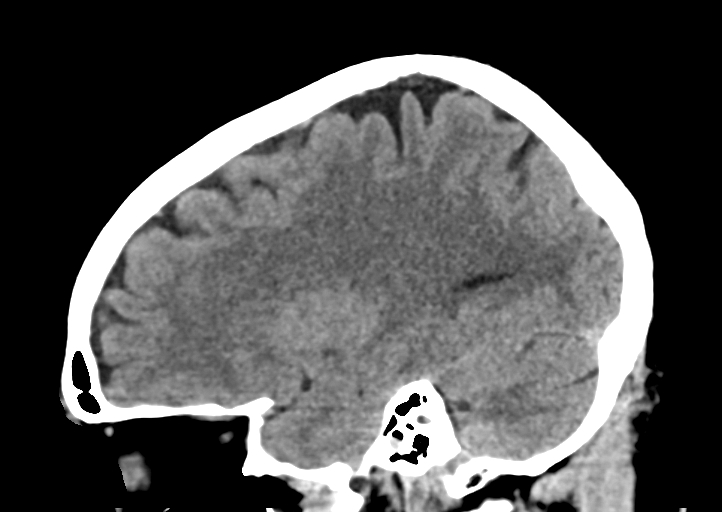

[16 of 47 positions shown; findings below may reference images not displayed]

FINDINGS: Brain: No evidence of acute infarction, hemorrhage, hydrocephalus,
extra-axial collection or mass lesion/mass effect.

Vascular: No hyperdense vessel or unexpected calcification.

Skull: Normal. Negative for fracture or focal lesion.

Sinuses/Orbits: The bilateral paranasal sinuses and mastoid air
cells are clear. The orbits are remarkable.

Other: None.
IMPRESSION: No acute intracranial abnormality.

## 2019-01-13 DIAGNOSIS — N5201 Erectile dysfunction due to arterial insufficiency: Secondary | ICD-10-CM | POA: Diagnosis not present

## 2019-01-13 DIAGNOSIS — R351 Nocturia: Secondary | ICD-10-CM | POA: Diagnosis not present

## 2019-01-13 DIAGNOSIS — N401 Enlarged prostate with lower urinary tract symptoms: Secondary | ICD-10-CM | POA: Diagnosis not present

## 2019-01-19 ENCOUNTER — Other Ambulatory Visit: Payer: Self-pay | Admitting: Urology

## 2019-02-22 DIAGNOSIS — N401 Enlarged prostate with lower urinary tract symptoms: Secondary | ICD-10-CM | POA: Diagnosis not present

## 2019-02-22 DIAGNOSIS — R351 Nocturia: Secondary | ICD-10-CM | POA: Diagnosis not present

## 2019-02-28 ENCOUNTER — Other Ambulatory Visit (HOSPITAL_COMMUNITY)
Admission: RE | Admit: 2019-02-28 | Discharge: 2019-02-28 | Disposition: A | Discharge status: Home / Self Care | Payer: Medicare Other | Source: Ambulatory Visit | Attending: Urology | Admitting: Urology

## 2019-02-28 DIAGNOSIS — Z01812 Encounter for preprocedural laboratory examination: Secondary | ICD-10-CM | POA: Diagnosis not present

## 2019-02-28 DIAGNOSIS — Z20828 Contact with and (suspected) exposure to other viral communicable diseases: Secondary | ICD-10-CM | POA: Insufficient documentation

## 2019-02-28 LAB — SARS CORONAVIRUS 2 (TAT 6-24 HRS): SARS Coronavirus 2: NEGATIVE

## 2019-03-01 ENCOUNTER — Encounter (HOSPITAL_BASED_OUTPATIENT_CLINIC_OR_DEPARTMENT_OTHER): Payer: Self-pay

## 2019-03-02 ENCOUNTER — Encounter (HOSPITAL_BASED_OUTPATIENT_CLINIC_OR_DEPARTMENT_OTHER): Payer: Self-pay | Admitting: *Deleted

## 2019-03-02 NOTE — Progress Notes (Signed)
Unable to reach pt with multiple attempts at times per recording unavailable however this afternoon able to leave message with instructions for tomorrow.  Pt told to be npo after mn and arrive at 1100.  Bring insurance card, photo ID, and medications in original prescription bottles since is has staying the night.  Also, told of visitor restriction policy and [facility] guidelines.   Pt will need medical history and medication list updated in epic.

## 2019-03-03 ENCOUNTER — Observation Stay (HOSPITAL_BASED_OUTPATIENT_CLINIC_OR_DEPARTMENT_OTHER)
Admission: RE | Admit: 2019-03-03 | Discharge: 2019-03-04 | Disposition: A | Discharge status: Home / Self Care | Payer: Medicare Other | Attending: Urology | Admitting: Urology

## 2019-03-03 ENCOUNTER — Encounter (HOSPITAL_BASED_OUTPATIENT_CLINIC_OR_DEPARTMENT_OTHER)
Admission: RE | Disposition: A | Discharge status: Home / Self Care | Payer: Self-pay | Source: Home / Self Care | Attending: Urology

## 2019-03-03 ENCOUNTER — Encounter (HOSPITAL_BASED_OUTPATIENT_CLINIC_OR_DEPARTMENT_OTHER): Payer: Self-pay

## 2019-03-03 ENCOUNTER — Ambulatory Visit (HOSPITAL_BASED_OUTPATIENT_CLINIC_OR_DEPARTMENT_OTHER): Payer: Medicare Other | Admitting: Anesthesiology

## 2019-03-03 ENCOUNTER — Other Ambulatory Visit: Payer: Self-pay | Admitting: Urology

## 2019-03-03 DIAGNOSIS — K219 Gastro-esophageal reflux disease without esophagitis: Secondary | ICD-10-CM | POA: Diagnosis not present

## 2019-03-03 DIAGNOSIS — N401 Enlarged prostate with lower urinary tract symptoms: Secondary | ICD-10-CM | POA: Diagnosis not present

## 2019-03-03 DIAGNOSIS — E876 Hypokalemia: Secondary | ICD-10-CM | POA: Diagnosis not present

## 2019-03-03 DIAGNOSIS — F209 Schizophrenia, unspecified: Secondary | ICD-10-CM | POA: Insufficient documentation

## 2019-03-03 DIAGNOSIS — R350 Frequency of micturition: Secondary | ICD-10-CM | POA: Diagnosis not present

## 2019-03-03 DIAGNOSIS — I471 Supraventricular tachycardia: Secondary | ICD-10-CM | POA: Diagnosis not present

## 2019-03-03 DIAGNOSIS — T83098A Other mechanical complication of other indwelling urethral catheter, initial encounter: Secondary | ICD-10-CM | POA: Diagnosis not present

## 2019-03-03 DIAGNOSIS — N400 Enlarged prostate without lower urinary tract symptoms: Secondary | ICD-10-CM | POA: Diagnosis not present

## 2019-03-03 DIAGNOSIS — D649 Anemia, unspecified: Secondary | ICD-10-CM | POA: Diagnosis not present

## 2019-03-03 DIAGNOSIS — N138 Other obstructive and reflux uropathy: Secondary | ICD-10-CM

## 2019-03-03 DIAGNOSIS — R823 Hemoglobinuria: Secondary | ICD-10-CM | POA: Diagnosis not present

## 2019-03-03 DIAGNOSIS — Z20828 Contact with and (suspected) exposure to other viral communicable diseases: Secondary | ICD-10-CM | POA: Insufficient documentation

## 2019-03-03 DIAGNOSIS — R55 Syncope and collapse: Secondary | ICD-10-CM | POA: Diagnosis not present

## 2019-03-03 DIAGNOSIS — D62 Acute posthemorrhagic anemia: Secondary | ICD-10-CM | POA: Diagnosis not present

## 2019-03-03 HISTORY — DX: Alcohol abuse, in remission: F10.11

## 2019-03-03 HISTORY — DX: Benign prostatic hyperplasia with lower urinary tract symptoms: N40.1

## 2019-03-03 HISTORY — PX: TRANSURETHRAL RESECTION OF PROSTATE: SHX73

## 2019-03-03 HISTORY — DX: Personal history of other specified conditions: Z87.898

## 2019-03-03 HISTORY — DX: Schizophrenia, unspecified: F20.9

## 2019-03-03 HISTORY — DX: Personal history of other diseases of the circulatory system: Z86.79

## 2019-03-03 LAB — CBC
HCT: 43.1 % (ref 39.0–52.0)
HCT: 41.3 % (ref 39.0–52.0)
HCT: 35.2 % — ABNORMAL LOW (ref 39.0–52.0)
Hemoglobin: 14 g/dL (ref 13.0–17.0)
Hemoglobin: 13.2 g/dL (ref 13.0–17.0)
Hemoglobin: 11.6 g/dL — ABNORMAL LOW (ref 13.0–17.0)
MCH: 33.1 pg (ref 26.0–34.0)
MCH: 33.3 pg (ref 26.0–34.0)
MCH: 34.6 pg — ABNORMAL HIGH (ref 26.0–34.0)
MCHC: 32.5 g/dL (ref 30.0–36.0)
MCHC: 32 g/dL (ref 30.0–36.0)
MCHC: 33 g/dL (ref 30.0–36.0)
MCV: 101.9 fL — ABNORMAL HIGH (ref 80.0–100.0)
MCV: 104.3 fL — ABNORMAL HIGH (ref 80.0–100.0)
MCV: 105.1 fL — ABNORMAL HIGH (ref 80.0–100.0)
Platelets: 159 10*3/uL (ref 150–400)
Platelets: 156 10*3/uL (ref 150–400)
Platelets: 151 10*3/uL (ref 150–400)
RBC: 4.23 MIL/uL (ref 4.22–5.81)
RBC: 3.96 MIL/uL — ABNORMAL LOW (ref 4.22–5.81)
RBC: 3.35 MIL/uL — ABNORMAL LOW (ref 4.22–5.81)
RDW: 13.2 % (ref 11.5–15.5)
RDW: 13.2 % (ref 11.5–15.5)
RDW: 13.3 % (ref 11.5–15.5)
WBC: 4 10*3/uL (ref 4.0–10.5)
WBC: 9 10*3/uL (ref 4.0–10.5)
WBC: 5.8 10*3/uL (ref 4.0–10.5)
nRBC: 0 % (ref 0.0–0.2)
nRBC: 0 % (ref 0.0–0.2)
nRBC: 0 % (ref 0.0–0.2)

## 2019-03-03 LAB — BASIC METABOLIC PANEL
Anion gap: 9 (ref 5–15)
Anion gap: 9 (ref 5–15)
BUN: 20 mg/dL (ref 8–23)
BUN: 17 mg/dL (ref 8–23)
CO2: 28 mmol/L (ref 22–32)
CO2: 25 mmol/L (ref 22–32)
Calcium: 9.3 mg/dL (ref 8.9–10.3)
Calcium: 8.4 mg/dL — ABNORMAL LOW (ref 8.9–10.3)
Chloride: 105 mmol/L (ref 98–111)
Chloride: 108 mmol/L (ref 98–111)
Creatinine, Ser: 1.08 mg/dL (ref 0.61–1.24)
Creatinine, Ser: 0.96 mg/dL (ref 0.61–1.24)
GFR calc Af Amer: >60 mL/min (ref >60)
GFR calc Af Amer: >60 mL/min (ref >60)
GFR calc non Af Amer: >60 mL/min (ref >60)
GFR calc non Af Amer: >60 mL/min (ref >60)
Glucose, Bld: 95 mg/dL (ref 70–99)
Glucose, Bld: 141 mg/dL — ABNORMAL HIGH (ref 70–99)
Potassium: 4.5 mmol/L (ref 3.5–5.1)
Potassium: 3.9 mmol/L (ref 3.5–5.1)
Sodium: 142 mmol/L (ref 135–145)
Sodium: 142 mmol/L (ref 135–145)

## 2019-03-03 LAB — TYPE AND SCREEN
ABO/RH(D): O NEG
Antibody Screen: NEGATIVE

## 2019-03-03 SURGERY — TURP (TRANSURETHRAL RESECTION OF PROSTATE)
Anesthesia: General | Site: Prostate

## 2019-03-03 MED ORDER — ACETAMINOPHEN 500 MG PO TABS
1000.0000 mg | ORAL_TABLET | Freq: Once | ORAL | Status: AC
  Administered 2019-03-03: 11:00:00 1000 mg via ORAL
  Filled 2019-03-03: qty 2

## 2019-03-03 MED ORDER — CELECOXIB 200 MG PO CAPS
ORAL_CAPSULE | ORAL | Status: AC
  Filled 2019-03-03: qty 1

## 2019-03-03 MED ORDER — PROPOFOL 10 MG/ML IV BOLUS
INTRAVENOUS | Status: DC | PRN
  Administered 2019-03-03: 150 mg via INTRAVENOUS

## 2019-03-03 MED ORDER — FENTANYL CITRATE (PF) 100 MCG/2ML IJ SOLN
25.0000 ug | INTRAMUSCULAR | Status: DC | PRN
  Filled 2019-03-03: qty 1

## 2019-03-03 MED ORDER — ONDANSETRON HCL 4 MG/2ML IJ SOLN
4.0000 mg | Freq: Once | INTRAMUSCULAR | Status: DC | PRN
  Filled 2019-03-03: qty 2

## 2019-03-03 MED ORDER — ONDANSETRON HCL 4 MG/2ML IJ SOLN
4.0000 mg | INTRAMUSCULAR | Status: DC | PRN
  Filled 2019-03-03: qty 2

## 2019-03-03 MED ORDER — FENTANYL CITRATE (PF) 100 MCG/2ML IJ SOLN
INTRAMUSCULAR | Status: AC
  Filled 2019-03-03: qty 2

## 2019-03-03 MED ORDER — CEFAZOLIN SODIUM-DEXTROSE 2-4 GM/100ML-% IV SOLN
2.0000 g | INTRAVENOUS | Status: AC
  Administered 2019-03-03: 2 g via INTRAVENOUS
  Filled 2019-03-03: qty 100

## 2019-03-03 MED ORDER — EPHEDRINE SULFATE-NACL 50-0.9 MG/10ML-% IV SOSY
PREFILLED_SYRINGE | INTRAVENOUS | Status: DC | PRN
  Administered 2019-03-03 (×2): 10 mg via INTRAVENOUS
  Administered 2019-03-03: 5 mg via INTRAVENOUS

## 2019-03-03 MED ORDER — GLYCOPYRROLATE PF 0.2 MG/ML IJ SOSY
PREFILLED_SYRINGE | INTRAMUSCULAR | Status: DC | PRN
  Administered 2019-03-03: .2 mg via INTRAVENOUS

## 2019-03-03 MED ORDER — CEFAZOLIN SODIUM-DEXTROSE 2-4 GM/100ML-% IV SOLN
INTRAVENOUS | Status: AC
  Filled 2019-03-03: qty 100

## 2019-03-03 MED ORDER — DEXAMETHASONE SODIUM PHOSPHATE 10 MG/ML IJ SOLN
INTRAMUSCULAR | Status: AC
  Filled 2019-03-03: qty 1

## 2019-03-03 MED ORDER — HYDROMORPHONE HCL 1 MG/ML IJ SOLN
0.5000 mg | INTRAMUSCULAR | Status: DC | PRN
  Filled 2019-03-03: qty 1

## 2019-03-03 MED ORDER — SUGAMMADEX SODIUM 200 MG/2ML IV SOLN
INTRAVENOUS | Status: DC | PRN
  Administered 2019-03-03: 200 mg via INTRAVENOUS

## 2019-03-03 MED ORDER — DEXAMETHASONE SODIUM PHOSPHATE 10 MG/ML IJ SOLN
INTRAMUSCULAR | Status: DC | PRN
  Administered 2019-03-03: 5 mg via INTRAVENOUS

## 2019-03-03 MED ORDER — MENTHOL 3 MG MT LOZG
1.0000 | LOZENGE | OROMUCOSAL | Status: DC | PRN
  Administered 2019-03-03: 22:00:00 3 mg via ORAL
  Filled 2019-03-03: qty 9

## 2019-03-03 MED ORDER — DIPHENHYDRAMINE HCL 12.5 MG/5ML PO ELIX
12.5000 mg | ORAL_SOLUTION | Freq: Four times a day (QID) | ORAL | Status: DC | PRN
  Filled 2019-03-03: qty 10

## 2019-03-03 MED ORDER — ROCURONIUM BROMIDE 10 MG/ML (PF) SYRINGE
PREFILLED_SYRINGE | INTRAVENOUS | Status: DC | PRN
  Administered 2019-03-03: 50 mg via INTRAVENOUS

## 2019-03-03 MED ORDER — ONDANSETRON HCL 4 MG/2ML IJ SOLN
INTRAMUSCULAR | Status: AC
  Filled 2019-03-03: qty 2

## 2019-03-03 MED ORDER — LIDOCAINE 2% (20 MG/ML) 5 ML SYRINGE
INTRAMUSCULAR | Status: DC | PRN
  Administered 2019-03-03: 80 mg via INTRAVENOUS

## 2019-03-03 MED ORDER — PANTOPRAZOLE SODIUM 40 MG PO TBEC
DELAYED_RELEASE_TABLET | ORAL | Status: AC
  Filled 2019-03-03: qty 1

## 2019-03-03 MED ORDER — OXYCODONE-ACETAMINOPHEN 5-325 MG PO TABS
1.0000 | ORAL_TABLET | ORAL | Status: DC | PRN
  Administered 2019-03-03 – 2019-03-04 (×2): 1 via ORAL
  Filled 2019-03-03: qty 2

## 2019-03-03 MED ORDER — RISPERIDONE 1 MG PO TABS
1.0000 mg | ORAL_TABLET | Freq: Two times a day (BID) | ORAL | Status: DC
  Administered 2019-03-03: 1 mg via ORAL
  Filled 2019-03-03 (×2): qty 1

## 2019-03-03 MED ORDER — BELLADONNA ALKALOIDS-OPIUM 16.2-60 MG RE SUPP
RECTAL | Status: AC
  Filled 2019-03-03: qty 1

## 2019-03-03 MED ORDER — ACETAMINOPHEN 500 MG PO TABS
ORAL_TABLET | ORAL | Status: AC
  Filled 2019-03-03: qty 2

## 2019-03-03 MED ORDER — BELLADONNA ALKALOIDS-OPIUM 16.2-60 MG RE SUPP
1.0000 | Freq: Four times a day (QID) | RECTAL | Status: DC | PRN
  Filled 2019-03-03: qty 1

## 2019-03-03 MED ORDER — ZOLPIDEM TARTRATE 5 MG PO TABS
5.0000 mg | ORAL_TABLET | Freq: Every evening | ORAL | Status: DC | PRN
  Administered 2019-03-04: 01:00:00 5 mg via ORAL
  Filled 2019-03-03: qty 1

## 2019-03-03 MED ORDER — ONDANSETRON HCL 4 MG/2ML IJ SOLN
INTRAMUSCULAR | Status: DC | PRN
  Administered 2019-03-03: 4 mg via INTRAVENOUS

## 2019-03-03 MED ORDER — PANTOPRAZOLE SODIUM 40 MG PO TBEC
40.0000 mg | DELAYED_RELEASE_TABLET | Freq: Every day | ORAL | Status: DC
  Administered 2019-03-03: 22:00:00 40 mg via ORAL
  Filled 2019-03-03: qty 1

## 2019-03-03 MED ORDER — MIDAZOLAM HCL 2 MG/2ML IJ SOLN
INTRAMUSCULAR | Status: AC
  Filled 2019-03-03: qty 2

## 2019-03-03 MED ORDER — SODIUM CHLORIDE 0.9 % IV SOLN
INTRAVENOUS | Status: DC
  Administered 2019-03-04: 02:00:00 via INTRAVENOUS
  Filled 2019-03-03 (×2): qty 1000

## 2019-03-03 MED ORDER — FENTANYL CITRATE (PF) 250 MCG/5ML IJ SOLN
INTRAMUSCULAR | Status: DC | PRN
  Administered 2019-03-03: 50 ug via INTRAVENOUS
  Administered 2019-03-03: 100 ug via INTRAVENOUS

## 2019-03-03 MED ORDER — LACTATED RINGERS IV SOLN
INTRAVENOUS | Status: DC
  Administered 2019-03-03: 11:00:00 via INTRAVENOUS
  Filled 2019-03-03: qty 1000

## 2019-03-03 MED ORDER — DIPHENHYDRAMINE HCL 50 MG/ML IJ SOLN
12.5000 mg | Freq: Four times a day (QID) | INTRAMUSCULAR | Status: DC | PRN
  Filled 2019-03-03: qty 0.5

## 2019-03-03 MED ORDER — SODIUM CHLORIDE 0.9 % IR SOLN
3000.0000 mL | Status: DC
  Administered 2019-03-03 – 2019-03-04 (×14): 3000 mL
  Filled 2019-03-03: qty 3000

## 2019-03-03 MED ORDER — PROPOFOL 10 MG/ML IV BOLUS
INTRAVENOUS | Status: AC
  Filled 2019-03-03: qty 20

## 2019-03-03 MED ORDER — ACETAMINOPHEN 325 MG PO TABS
650.0000 mg | ORAL_TABLET | ORAL | Status: DC | PRN
  Filled 2019-03-03: qty 2

## 2019-03-03 MED ORDER — CELECOXIB 200 MG PO CAPS
200.0000 mg | ORAL_CAPSULE | Freq: Once | ORAL | Status: AC
  Administered 2019-03-03: 11:00:00 200 mg via ORAL
  Filled 2019-03-03: qty 1

## 2019-03-03 MED ORDER — SODIUM CHLORIDE 0.9 % IR SOLN
Status: DC | PRN
  Administered 2019-03-03: 30000 mL

## 2019-03-03 MED ORDER — MENTHOL 3 MG MT LOZG
LOZENGE | OROMUCOSAL | Status: AC
  Filled 2019-03-03: qty 9

## 2019-03-03 MED ORDER — DIPHENHYDRAMINE HCL 50 MG/ML IJ SOLN
INTRAMUSCULAR | Status: DC | PRN
  Administered 2019-03-03: 12.5 mg via INTRAVENOUS

## 2019-03-03 MED ORDER — OXYCODONE-ACETAMINOPHEN 5-325 MG PO TABS
ORAL_TABLET | ORAL | Status: AC
  Filled 2019-03-03: qty 1

## 2019-03-03 MED ORDER — DIPHENHYDRAMINE HCL 50 MG/ML IJ SOLN
INTRAMUSCULAR | Status: AC
  Filled 2019-03-03: qty 1

## 2019-03-03 MED ORDER — MIDAZOLAM HCL 5 MG/5ML IJ SOLN
INTRAMUSCULAR | Status: DC | PRN
  Administered 2019-03-03: 2 mg via INTRAVENOUS

## 2019-03-03 MED ORDER — BENZTROPINE MESYLATE 1 MG PO TABS
1.0000 mg | ORAL_TABLET | Freq: Two times a day (BID) | ORAL | Status: DC
  Administered 2019-03-03: 22:00:00 1 mg via ORAL
  Filled 2019-03-03 (×2): qty 1

## 2019-03-03 MED ORDER — BELLADONNA ALKALOIDS-OPIUM 16.2-60 MG RE SUPP
RECTAL | Status: DC | PRN
  Administered 2019-03-03: 1 via RECTAL

## 2019-03-03 SURGICAL SUPPLY — 20 items
BAG DRAIN URO-CYSTO SKYTR STRL (DRAIN) ×3 IMPLANT
BAG URINE DRAINAGE (UROLOGICAL SUPPLIES) ×3 IMPLANT
BAG URINE LEG 500ML (DRAIN) IMPLANT
CATH FOLEY 3WAY 30CC 22F (CATHETERS) ×3 IMPLANT
CLOTH BEACON ORANGE TIMEOUT ST (SAFETY) ×3 IMPLANT
ELECT REM PT RETURN 9FT ADLT (ELECTROSURGICAL)
ELECTRODE REM PT RTRN 9FT ADLT (ELECTROSURGICAL) IMPLANT
GLOVE BIO SURGEON STRL SZ8 (GLOVE) ×3 IMPLANT
GOWN STRL REUS W/TWL XL LVL3 (GOWN DISPOSABLE) ×9 IMPLANT
HOLDER FOLEY CATH W/STRAP (MISCELLANEOUS) ×3 IMPLANT
IV NS IRRIG 3000ML ARTHROMATIC (IV SOLUTION) ×30 IMPLANT
KIT TURNOVER CYSTO (KITS) ×3 IMPLANT
MANIFOLD NEPTUNE II (INSTRUMENTS) ×3 IMPLANT
PACK CYSTO (CUSTOM PROCEDURE TRAY) ×3 IMPLANT
PLUG CATH AND CAP STER (CATHETERS) ×3 IMPLANT
SYR 30ML LL (SYRINGE) ×3 IMPLANT
SYRINGE IRR TOOMEY STRL 70CC (SYRINGE) IMPLANT
TUBE CONNECTING 12'X1/4 (SUCTIONS)
TUBE CONNECTING 12X1/4 (SUCTIONS) IMPLANT
TUBING UROLOGY SET (TUBING) ×3 IMPLANT

## 2019-03-03 NOTE — Progress Notes (Signed)
I was paged to evaluate the patient's hematuria.  He is status post TURP.  He is without complaints and has no bladder or abdominal pain.  CBI is "wide open" but only on a moderate drip with the bags on gravity and not that elevated.  The urine is light pink in the tubing and light red in the Foley bag.  Catheter on moderate traction but not severe.  No bleeding from around the catheter.  Postoperative bleeding from TURP-continue CBI.  After the initial page, to play it safe we sent a type and cross and will check another hemoglobin, but I am hopeful he will not need a blood transfusion.

## 2019-03-03 NOTE — H&P (Signed)
Urology Admission H&P  Chief Complaint: urinary frequency  History of Present Illness: [name] is a 63yo with a history of BPH who has failed medical therapy. He has severe urinayr freqency, urgency and nocturia refractory to alpha blocker and 5ARI therapy.  Past Medical History:  Diagnosis Date  . Benign localized prostatic hyperplasia with lower urinary tract symptoms (LUTS)   . History of alcohol abuse   . History of chest pain    ED visit 01-17-2016 dx unspecified chest pain, GERD, hyponatremia  . History of supraventricular tachycardia    ED visit 12-15-2015;  per d/c note by dr allred (cardiology) to follow up as outpt for ETT and Echo  . History of syncope    ED visit 01-27-2017 w/ LOC-- situational (bowel movement) and vasovagal  . Schizophrenia, chronic condition (HCC)    History reviewed. No pertinent surgical history.  Home Medications:  Current Facility-Administered Medications  Medication Dose Route Frequency Provider Last Rate Last Dose  . ceFAZolin (ANCEF) IVPB 2g/100 mL premix  2 g Intravenous 30 min Pre-Op Ronne BinningMcKenzie, Mardene CelestePatrick L, MD      . lactated ringers infusion   Intravenous Continuous Leilani AbleHatchett, Franklin, MD 50 mL/hr at 03/03/19 1102     Allergies: No Known Allergies  History reviewed. No pertinent family history. Social History:  reports that he has never smoked. He has never used smokeless tobacco. He reports that he does not drink alcohol or use drugs.  Review of Systems  Genitourinary: Positive for frequency and urgency.  All other systems reviewed and are negative.   Physical Exam:  Vital signs in last 24 hours: Temp:  [98.1 F (36.7 C)] 98.1 F (36.7 C) (09/03 0935) Pulse Rate:  [47] 47 (09/03 0935) Resp:  [16] 16 (09/03 0935) BP: (114)/(70) 114/70 (09/03 0935) SpO2:  [100 %] 100 % (09/03 0935) Weight:  [75.1 kg] 75.1 kg (09/03 0935) Physical Exam  Constitutional: He is oriented to person, place, and time. He appears well-developed and  well-nourished.  HENT:  Head: Normocephalic and atraumatic.  Eyes: Pupils are equal, round, and reactive to light. EOM are normal.  Neck: Normal range of motion. No thyromegaly present.  Cardiovascular: Normal rate and regular rhythm.  Respiratory: Effort normal. No respiratory distress.  GI: Soft. He exhibits no distension.  Musculoskeletal: Normal range of motion.        General: No edema.  Neurological: He is alert and oriented to person, place, and time.  Skin: Skin is warm and dry.  Psychiatric: He has a normal mood and affect. His behavior is normal. Judgment and thought content normal.    Laboratory Data:  Results for orders placed or performed during the hospital encounter of 03/03/19 (from the past 24 hour(s))  CBC     Status: Abnormal   Collection Time: 03/03/19 11:01 AM  Result Value Ref Range   WBC 4.0 4.0 - 10.5 K/uL   RBC 4.23 4.22 - 5.81 MIL/uL   Hemoglobin 14.0 13.0 - 17.0 g/dL   HCT 16.143.1 09.639.0 - 04.552.0 %   MCV 101.9 (H) 80.0 - 100.0 fL   MCH 33.1 26.0 - 34.0 pg   MCHC 32.5 30.0 - 36.0 g/dL   RDW 40.913.2 81.111.5 - 91.415.5 %   Platelets 159 150 - 400 K/uL   nRBC 0.0 0.0 - 0.2 %  Basic metabolic panel     Status: None   Collection Time: 03/03/19 11:01 AM  Result Value Ref Range   Sodium 142 135 - 145 mmol/L  Potassium 4.5 3.5 - 5.1 mmol/L   Chloride 105 98 - 111 mmol/L   CO2 28 22 - 32 mmol/L   Glucose, Bld 95 70 - 99 mg/dL   BUN 20 8 - 23 mg/dL   Creatinine, Ser 1.08 0.61 - 1.24 mg/dL   Calcium 9.3 8.9 - 10.3 mg/dL   GFR calc non Af Amer >60 >60 mL/min   GFR calc Af Amer >60 >60 mL/min   Anion gap 9 5 - 15   Recent Results (from the past 240 hour(s))  SARS CORONAVIRUS 2 (TAT 6-24 HRS) Nasopharyngeal Nasopharyngeal Swab     Status: None   Collection Time: 02/28/19  8:09 AM   Specimen: Nasopharyngeal Swab  Result Value Ref Range Status   SARS Coronavirus 2 NEGATIVE NEGATIVE Final    Comment: (NOTE) SARS-CoV-2 target nucleic acids are NOT DETECTED. The SARS-CoV-2  RNA is generally detectable in upper and lower respiratory specimens during the acute phase of infection. Negative results do not preclude SARS-CoV-2 infection, do not rule out co-infections with other pathogens, and should not be used as the sole basis for treatment or other patient management decisions. Negative results must be combined with clinical observations, patient history, and epidemiological information. The expected result is Negative. Fact Sheet for Patients: SugarRoll.be Fact Sheet for Healthcare Providers: https://www.woods-mathews.com/ This test is not yet approved or cleared by the [place] FDA and  has been authorized for detection and/or diagnosis of SARS-CoV-2 by FDA under an Emergency Use Authorization (EUA). This EUA will remain  in effect (meaning this test can be used) for the duration of the COVID-19 declaration under Section 56 4(b)(1) of the Act, 21 U.S.C. section 360bbb-3(b)(1), unless the authorization is terminated or revoked sooner. Performed at [facility], [place] [address], [place], [place] [address]    Creatinine: Recent Labs    03/03/19 1101  CREATININE 1.08   Baseline Creatinine: 1  Impression/Assessment:  63yo with BPhw ith LUTS, nocturia  Plan:  The risks/benefits/alterantives to TURP was explained to the patient and he understands and wishes to proceed with surgery  Nicolette Bang 03/03/2019, 11:31 AM

## 2019-03-03 NOTE — Progress Notes (Signed)
03/03/2019 4:34 PM Dr. Alyson Ingles paged and made aware of pt. With continued thick dark red urine in foley bag despite CBI running at fast rate. No clots noted. Foley traction already in place. Verbal order received to increase the amount of foley traction applied and continue current interventions. Orders enacted. Will continue to closely monitor patient.  Brielle Moro, Arville Lime

## 2019-03-03 NOTE — Transfer of Care (Signed)
Immediate Anesthesia Transfer of Care Note  Patient: Billy Boyd  Procedure(s) Performed: TRANSURETHRAL RESECTION OF THE PROSTATE (TURP) (N/A Prostate)  Patient Location: PACU  Anesthesia Type:General  Level of Consciousness: sedated and responds to stimulation  Airway & Oxygen Therapy: Patient Spontanous Breathing and Patient connected to nasal cannula oxygen  Post-op Assessment: Report given to RN, Post -op Vital signs reviewed and stable and Patient moving all extremities  Post vital signs: Reviewed and stable  Last Vitals:  Vitals Value Taken Time  BP    Temp 36.1 C 03/03/19 1423  Pulse 72 03/03/19 1427  Resp 13 03/03/19 1427  SpO2 100 % 03/03/19 1427  Vitals shown include unvalidated device data.  Last Pain:  Vitals:   03/03/19 0935  TempSrc: Oral  PainSc: 0-No pain      Patients Stated Pain Goal: 5 (23/76/28 3151)  Complications: No apparent anesthesia complications

## 2019-03-03 NOTE — Progress Notes (Signed)
Checked on patient on the way out -- nurse said they got a small clot.  Again urine light red to almost ruby on moderate CBI .  I irrigated his bladder with a toomey and got equal return. Just a few very small clots.   Cont CBI. Check H/h. Pt stable.

## 2019-03-03 NOTE — Anesthesia Postprocedure Evaluation (Signed)
Anesthesia Post Note  Patient: Billy Boyd  Procedure(s) Performed: TRANSURETHRAL RESECTION OF THE PROSTATE (TURP) (N/A Prostate)     Patient location during evaluation: PACU Anesthesia Type: General Level of consciousness: awake and alert Pain management: pain level controlled Vital Signs Assessment: post-procedure vital signs reviewed and stable Respiratory status: spontaneous breathing, nonlabored ventilation, respiratory function stable and patient connected to nasal cannula oxygen Cardiovascular status: blood pressure returned to baseline and stable Postop Assessment: no apparent nausea or vomiting Anesthetic complications: no    Last Vitals:  Vitals:   03/03/19 1530 03/03/19 1830  BP: (!) 141/80 (!) 113/58  Pulse: 67 (!) 58  Resp: 18 16  Temp: 36.6 C 36.9 C  SpO2: 100% 100%    Last Pain:  Vitals:   03/03/19 1830  TempSrc:   PainSc: 0-No pain                 Catalina Gravel

## 2019-03-03 NOTE — Progress Notes (Addendum)
03/03/2019 7:14 PM MD on call paged to make aware of continued bloody urine in drainage bag despite CBI continuing on fast setting. RN unable to titrate down due to bleeding. No clots in catheter tubing, catheter patent but  Small stringy clots are noted in drainage bag during emptying. PT. asymptomatic and states he feels fine. VSS. Azucena Fallen NP paged and made aware. NP to contact Dr. Junious Silk and return call to RN. Denita Lun, Arville Lime   03/03/2019 7:30 PM Received call back from Azucena Fallen NP. Verbal order received for T & S now and repeat CBC at 10 PM. Dr. Junious Silk to come see patient. Pt. Updated on plan of care. Orders enacted. Will continue to closely monitor patient.  Marlyss Cissell, Arville Lime

## 2019-03-03 NOTE — Op Note (Signed)
Preoperative diagnosis: BPH  Postoperative diagnosis: BPH  Procedure: 1 cystoscopy 2. Transurethral resection of the prostate  Attending: Nicolette Bang  Anesthesia: General  Estimated blood loss: Minimal  Drains: 22 French foley  Specimens: 1. Prostate Chips  Antibiotics: Rocephin  Findings: Trilobar prostate enlargement. Ureteral orifices in normal anatomic location.   Indications: Patient is a 63 year old male with a history of BPH and elevated PVR.  After discussing treatment options, they decided proceed with transurethral resection of the prostate.  Procedure her in detail: The patient was brought to the operating room and a brief timeout was done to ensure correct patient, correct procedure, correct site.  General anesthesia was administered patient was placed in dorsal lithotomy position.  Their genitalia was then prepped and draped in usual sterile fashion.  A rigid 43 French cystoscope was passed in the urethra and the bladder.  Bladder was inspected and we noted no masses or lesions.  the ureteral orifices were in the normal orthotopic locations. removed the cystoscope and placed a resectoscope into the bladder. We then turned our attention to the prostate resection. Using the bipolar resectoscope we resected the median lobe first from the bladder neck to the verumontanum. We then started at the 12 oclock position on the left lobe and resection to the 6 o'clock position from the bladder neck to the verumontanum. We then did the same resection of the right lobe. Once the resection was complete we then cauterized individual bleeders. We then removed the prostate chips and sent them for pathology.  We then re-inspected the prostatic fossa and found no residual bleeding.  the bladder was then drained, a 22 French foley was placed and this concluded the procedure which was well tolerated by patient.  Complications: None  Condition: Stable, extubated, transferred to PACU  Plan:  Patient is admitted overnight with continuous bladder irrigation. If their urine is clear tomorrow they will be discharged home and followup in 5 days for foley catheter removal and pathology discussion.

## 2019-03-03 NOTE — Anesthesia Procedure Notes (Signed)
Procedure Name: Intubation Date/Time: 03/03/2019 12:37 PM Performed by: Myna Bright, CRNA Pre-anesthesia Checklist: Patient identified, Emergency Drugs available, Suction available and Patient being monitored Patient Re-evaluated:Patient Re-evaluated prior to induction Oxygen Delivery Method: Circle system utilized Preoxygenation: Pre-oxygenation with 100% oxygen Induction Type: IV induction Ventilation: Mask ventilation without difficulty Laryngoscope Size: Mac and 4 Grade View: Grade I Tube type: Oral Tube size: 7.5 mm Number of attempts: 1 Airway Equipment and Method: Stylet Placement Confirmation: ETT inserted through vocal cords under direct vision,  positive ETCO2 and breath sounds checked- equal and bilateral Secured at: 22 cm Tube secured with: Tape Dental Injury: Teeth and Oropharynx as per pre-operative assessment

## 2019-03-03 NOTE — Anesthesia Preprocedure Evaluation (Addendum)
Anesthesia Evaluation  Patient identified by MRN, date of birth, ID band Patient awake    Reviewed: Allergy & Precautions, NPO status , Patient's Chart, lab work & pertinent test results  Airway Mallampati: II  TM Distance: >3 FB Neck ROM: Full    Dental  (+) Dental Advisory Given, Edentulous Upper   Pulmonary neg pulmonary ROS,    Pulmonary exam normal breath sounds clear to auscultation       Cardiovascular Normal cardiovascular exam+ dysrhythmias Supra Ventricular Tachycardia  Rhythm:Regular Rate:Normal     Neuro/Psych PSYCHIATRIC DISORDERS Schizophrenia negative neurological ROS     GI/Hepatic GERD  Medicated,(+)     substance abuse  alcohol use,   Endo/Other  negative endocrine ROS  Renal/GU negative Renal ROS   BENIGN PROSTATIC HYPERPLASIA    Musculoskeletal negative musculoskeletal ROS (+)   Abdominal   Peds  Hematology negative hematology ROS (+)   Anesthesia Other Findings Day of surgery medications reviewed with the patient.  Reproductive/Obstetrics                            Anesthesia Physical Anesthesia Plan  ASA: III  Anesthesia Plan: General   Post-op Pain Management:    Induction: Intravenous  PONV Risk Score and Plan: 4 or greater and Midazolam, Dexamethasone, Ondansetron and Diphenhydramine  Airway Management Planned: Oral ETT  Additional Equipment:   Intra-op Plan:   Post-operative Plan: Extubation in OR  Informed Consent: I have reviewed the patients History and Physical, chart, labs and discussed the procedure including the risks, benefits and alternatives for the proposed anesthesia with the patient or authorized representative who has indicated his/her understanding and acceptance.     Dental advisory given  Plan Discussed with: CRNA  Anesthesia Plan Comments:         Anesthesia Quick Evaluation

## 2019-03-04 ENCOUNTER — Encounter (HOSPITAL_BASED_OUTPATIENT_CLINIC_OR_DEPARTMENT_OTHER): Payer: Self-pay | Admitting: Urology

## 2019-03-04 ENCOUNTER — Other Ambulatory Visit: Payer: Self-pay

## 2019-03-04 ENCOUNTER — Emergency Department (HOSPITAL_COMMUNITY)
Admission: EM | Admit: 2019-03-04 | Discharge: 2019-03-04 | Disposition: A | Discharge status: Home / Self Care | Payer: Medicare Other | Source: Home / Self Care | Attending: Emergency Medicine | Admitting: Emergency Medicine

## 2019-03-04 DIAGNOSIS — T83098A Other mechanical complication of other indwelling urethral catheter, initial encounter: Secondary | ICD-10-CM | POA: Diagnosis not present

## 2019-03-04 DIAGNOSIS — R55 Syncope and collapse: Secondary | ICD-10-CM | POA: Diagnosis not present

## 2019-03-04 DIAGNOSIS — I471 Supraventricular tachycardia: Secondary | ICD-10-CM | POA: Diagnosis not present

## 2019-03-04 DIAGNOSIS — D62 Acute posthemorrhagic anemia: Secondary | ICD-10-CM | POA: Diagnosis not present

## 2019-03-04 DIAGNOSIS — R823 Hemoglobinuria: Secondary | ICD-10-CM | POA: Diagnosis not present

## 2019-03-04 DIAGNOSIS — D649 Anemia, unspecified: Secondary | ICD-10-CM | POA: Diagnosis not present

## 2019-03-04 DIAGNOSIS — T839XXA Unspecified complication of genitourinary prosthetic device, implant and graft, initial encounter: Secondary | ICD-10-CM

## 2019-03-04 DIAGNOSIS — Z20828 Contact with and (suspected) exposure to other viral communicable diseases: Secondary | ICD-10-CM | POA: Diagnosis not present

## 2019-03-04 LAB — CBC
HCT: 31.5 % — ABNORMAL LOW (ref 39.0–52.0)
Hemoglobin: 10.1 g/dL — ABNORMAL LOW (ref 13.0–17.0)
MCH: 33.2 pg (ref 26.0–34.0)
MCHC: 32.1 g/dL (ref 30.0–36.0)
MCV: 103.6 fL — ABNORMAL HIGH (ref 80.0–100.0)
Platelets: 133 10*3/uL — ABNORMAL LOW (ref 150–400)
RBC: 3.04 MIL/uL — ABNORMAL LOW (ref 4.22–5.81)
RDW: 13.2 % (ref 11.5–15.5)
WBC: 6.8 10*3/uL (ref 4.0–10.5)
nRBC: 0 % (ref 0.0–0.2)

## 2019-03-04 LAB — BASIC METABOLIC PANEL
Anion gap: 4 — ABNORMAL LOW (ref 5–15)
BUN: 14 mg/dL (ref 8–23)
CO2: 26 mmol/L (ref 22–32)
Calcium: 8.4 mg/dL — ABNORMAL LOW (ref 8.9–10.3)
Chloride: 109 mmol/L (ref 98–111)
Creatinine, Ser: 0.92 mg/dL (ref 0.61–1.24)
GFR calc Af Amer: >60 mL/min (ref >60)
GFR calc non Af Amer: >60 mL/min (ref >60)
Glucose, Bld: 111 mg/dL — ABNORMAL HIGH (ref 70–99)
Potassium: 4.8 mmol/L (ref 3.5–5.1)
Sodium: 139 mmol/L (ref 135–145)

## 2019-03-04 LAB — ABO/RH: ABO/RH(D): O NEG

## 2019-03-04 MED ORDER — OXYCODONE-ACETAMINOPHEN 5-325 MG PO TABS: 1.0000 | ORAL_TABLET | ORAL | 0 refills | Status: DC | PRN

## 2019-03-04 MED ORDER — OXYCODONE-ACETAMINOPHEN 5-325 MG PO TABS
ORAL_TABLET | ORAL | Status: AC
  Filled 2019-03-04: qty 1

## 2019-03-04 MED ORDER — ZOLPIDEM TARTRATE 5 MG PO TABS
ORAL_TABLET | ORAL | Status: AC
  Filled 2019-03-04: qty 1

## 2019-03-04 NOTE — Discharge Instructions (Addendum)
Follow-up with your urologist as planned.  Call them if you have any concerns.  Your catheter seems to be functioning properly right now.

## 2019-03-04 NOTE — Discharge Instructions (Signed)
Transurethral Resection of the Prostate, Care After °This sheet gives you information about how to care for yourself after your procedure. Your health care provider may also give you more specific instructions. If you have problems or questions, contact your health care provider. °What can I expect after the procedure? °After the procedure, it is common to have: °· Mild pain in your lower abdomen. °· Soreness or mild discomfort in your penis from having the catheter inserted during the procedure. °· A feeling of urgency when you need to urinate. °· A small amount of blood in your urine. You may notice some small blood clots in your urine. These are normal. °Follow these instructions at home: °Medicines °· Take over-the-counter and prescription medicines only as told by your health care provider. °· If you were prescribed an antibiotic medicine, take it as told by your health care provider. Do not stop taking the antibiotic even if you start to feel better. °· Ask your health care provider if the medicine prescribed to you: °? Requires you to avoid driving or using heavy machinery. °? Can cause constipation. You may need to take actions to prevent or treat constipation, such as: °§ Take over-the-counter or prescription medicines. °§ Eat foods that are high in fiber, such as fresh fruits and vegetables, whole grains, and beans. °§ Limit foods that are high in fat and processed sugars, such as fried or sweet foods. °· Do not drive for 24 hours if you were given a sedative during your procedure. °Activity ° °· Return to your normal activities as told by your health care provider. Ask your health care provider what activities are safe for you. °· Do not lift anything that is heavier than 10 lb (4.5 kg), or the limit that you are told, for 3 weeks after the procedure or until your health care provider says that it is safe. °· Avoid intense physical activity for as long as told by your health care provider. °· Avoid  sitting for a long time without moving. Get up and move around one or more times every few hours. This helps to prevent blood clots. You may increase your physical activity gradually as you start to feel better. °Lifestyle °· Do not drink alcohol for as long as told by your health care provider. This is especially important if you are taking prescription pain medicines. °· Do not engage in sexual activity until your health care provider says that you can do this. °General instructions ° °· Do not take baths, swim, or use a hot tub until your health care provider approves. °· Drink enough fluid to keep your urine pale yellow. °· Urinate as soon as you feel the need to. Do not try to hold your urine for long periods of time. °· If your health care provider approves, you may take a stool softener for 2-3 weeks to prevent you from straining to have a bowel movement. °· Wear compression stockings as told by your health care provider. These stockings help to prevent blood clots and reduce swelling in your legs. °· Keep all follow-up visits as told by your health care provider. This is important. °Contact a health care provider if you have: °· Difficulty urinating. °· A fever. °· Pain that gets worse or does not improve with medicine. °· Blood in your urine that does not go away after 1 week of resting and drinking more fluids. °· Swelling in your penis or testicles. °Get help right away if: °· You are unable   to urinate. °· You are having more blood clots in your urine instead of fewer. °· You have: °? Large blood clots. °? A lot of blood in your urine. °? Pain in your back or lower abdomen. °? Pain or swelling in your legs. °? Chills and you are shaking. °? Difficulty breathing or shortness of breath. °Summary °· After the procedure, it is common to have a small amount of blood in your urine. °· Avoid heavy lifting and intense physical activity for as long as told by your health care provider. °· Urinate as soon as you  feel the need to. Do not try to hold your urine for long periods of time. °· Keep all follow-up visits as told by your health care provider. This is important. °This information is not intended to replace advice given to you by your health care provider. Make sure you discuss any questions you have with your health care provider. °Document Released: 06/16/2005 Document Revised: 10/06/2018 Document Reviewed: 03/17/2018 °Elsevier Patient Education © 2020 [employer] ° °

## 2019-03-04 NOTE — ED Provider Notes (Signed)
[facility]-EMERGENCY DEPT Provider Note   CSN: [id number] Arrival date & time: 03/04/19  1949     History   Chief Complaint Chief Complaint  Patient presents with  . Foley Issue    HPI Billy Boyd is a 63 y.o. male.     HPI Patient had surgery yesterday for prostatic hypertrophy and urinary obstruction.  Patient had a transurethral resection of the prostate by Dr. Ronne BinningMcKenzie.  Patient was discharged with a Foley catheter in place.  States today he noticed that he was leaking some urine from around the catheter.  He came to the ED for evaluation.  Patient states it has stopped doing that at this point.  He continues to have urine output in the catheter bag.  He denies any abdominal pain.  No fevers or chills. Past Medical History:  Diagnosis Date  . Benign localized prostatic hyperplasia with lower urinary tract symptoms (LUTS)   . History of alcohol abuse   . History of chest pain    ED visit 01-17-2016 dx unspecified chest pain, GERD, hyponatremia  . History of supraventricular tachycardia    ED visit 12-15-2015;  per d/c note by dr allred (cardiology) to follow up as outpt for ETT and Echo  . History of syncope    ED visit 01-27-2017 w/ LOC-- situational (bowel movement) and vasovagal  . Schizophrenia, chronic condition [facility](HCC)     Patient Active Problem List   Diagnosis Date Noted  . BPH (benign prostatic hyperplasia) 03/03/2019  . Hypokalemia 12/16/2015  . SVT (supraventricular tachycardia) (HCC) 12/15/2015    Past Surgical History:  Procedure Laterality Date  . TRANSURETHRAL RESECTION OF PROSTATE N/A 03/03/2019   Procedure: TRANSURETHRAL RESECTION OF THE PROSTATE (TURP);  Surgeon: Malen GauzeMcKenzie, Patrick L, MD;  [facility] ;  Service: Urology;  Laterality: N/A;        Home Medications    Prior to Admission medications   Medication Sig Start Date End Date Taking? Authorizing Provider  omeprazole (PRILOSEC) 20 MG capsule  Take 1 capsule (20 mg total) by mouth daily. 01/17/16   Benjiman CorePickering, Nathan, MD  oxyCODONE-acetaminophen (PERCOCET/ROXICET) 5-325 MG tablet Take 1 tablet by mouth every 4 (four) hours as needed for moderate pain. 03/04/19   McKenzie, Mardene CelestePatrick L, MD  risperiDONE (RISPERDAL) 1 MG tablet Take 1 mg by mouth 2 (two) times daily.     [provider]    Family History History reviewed. No pertinent family history.  Social History Social History   Tobacco Use  . Smoking status: Never Smoker  . Smokeless tobacco: Never Used  Substance Use Topics  . Alcohol use: No  . Drug use: No     Allergies   Patient has no known allergies.   Review of Systems Review of Systems  All other systems reviewed and are negative.    Physical Exam Updated Vital Signs BP 102/73 (BP Location: Left Arm)   Pulse 74   Temp 98.1 F (36.7 C) (Oral)   Resp 16   SpO2 99%   Physical Exam Vitals signs and nursing note reviewed.  Constitutional:      General: He is not in acute distress.    Appearance: He is well-developed.  HENT:     Head: Normocephalic and atraumatic.     Right Ear: External ear normal.     Left Ear: External ear normal.  Eyes:     General: No scleral icterus.       Right eye: No discharge.  Left eye: No discharge.     Conjunctiva/sclera: Conjunctivae normal.  Neck:     Musculoskeletal: Neck supple.     Trachea: No tracheal deviation.  Cardiovascular:     Rate and Rhythm: Normal rate.  Pulmonary:     Effort: Pulmonary effort is normal. No respiratory distress.     Breath sounds: No stridor.  Abdominal:     General: There is no distension.     Palpations: There is no mass.     Tenderness: There is no abdominal tenderness. There is no guarding.  Genitourinary:    Penis: Normal.      Comments: Foley catheter in place, bloody appearing urine in the catheter bag, no evidence of leakage around the catheter site Musculoskeletal:        General: No swelling or deformity.   Skin:    General: Skin is warm and dry.     Findings: No rash.  Neurological:     Mental Status: He is alert.     Cranial Nerves: Cranial nerve deficit: no gross deficits.      ED Treatments / Results   Procedures Procedures (including critical care time)  Medications Ordered in ED Medications - No data to display   Initial Impression / Assessment and Plan / ED Course  I have reviewed the triage vital signs and the nursing notes.  Pertinent labs & imaging results that were available during my care of the patient were reviewed by me and considered in my medical decision making (see chart for details).   Patient complained of some leakage around his catheter site earlier.  I suspect he was even having trouble some bladder spasms or maybe there was some temporary obstruction of his catheter from his hematuria.  Patient is not having any issues right now.  I will have the nurse irrigate the catheter to make sure it is continue to drain properly.  I reassured the patient that his catheter seems to be functioning properly.  Recommended he contact his urologist for any further questions.  Final Clinical Impressions(s) / ED Diagnoses   Final diagnoses:  Complication of Foley catheter, initial encounter [facility])    ED Discharge Orders    None       Dorie Rank, MD 03/04/19 2107

## 2019-03-04 NOTE — ED Notes (Signed)
Patient's foley irrigated without difficulty.

## 2019-03-04 NOTE — ED Triage Notes (Signed)
Pt reports that his foley is leaking. Draining red urine. No leak or clog seen in triage. Had foley placed about 1 hour ago.

## 2019-03-06 ENCOUNTER — Inpatient Hospital Stay (HOSPITAL_COMMUNITY)
Admission: EM | Admit: 2019-03-06 | Discharge: 2019-03-08 | DRG: 812 | Disposition: A | Discharge status: Home / Self Care | Payer: Medicare Other | Attending: Internal Medicine | Admitting: Internal Medicine

## 2019-03-06 ENCOUNTER — Emergency Department (HOSPITAL_COMMUNITY): Payer: Medicare Other

## 2019-03-06 ENCOUNTER — Other Ambulatory Visit: Payer: Self-pay

## 2019-03-06 ENCOUNTER — Encounter (HOSPITAL_COMMUNITY): Payer: Self-pay | Admitting: Emergency Medicine

## 2019-03-06 DIAGNOSIS — Z20828 Contact with and (suspected) exposure to other viral communicable diseases: Secondary | ICD-10-CM | POA: Diagnosis not present

## 2019-03-06 DIAGNOSIS — R310 Gross hematuria: Secondary | ICD-10-CM | POA: Diagnosis present

## 2019-03-06 DIAGNOSIS — I471 Supraventricular tachycardia, unspecified: Secondary | ICD-10-CM | POA: Diagnosis present

## 2019-03-06 DIAGNOSIS — R202 Paresthesia of skin: Secondary | ICD-10-CM | POA: Diagnosis not present

## 2019-03-06 DIAGNOSIS — Z79899 Other long term (current) drug therapy: Secondary | ICD-10-CM

## 2019-03-06 DIAGNOSIS — R61 Generalized hyperhidrosis: Secondary | ICD-10-CM | POA: Diagnosis present

## 2019-03-06 DIAGNOSIS — F203 Undifferentiated schizophrenia: Secondary | ICD-10-CM | POA: Diagnosis not present

## 2019-03-06 DIAGNOSIS — R823 Hemoglobinuria: Secondary | ICD-10-CM

## 2019-03-06 DIAGNOSIS — I248 Other forms of acute ischemic heart disease: Secondary | ICD-10-CM

## 2019-03-06 DIAGNOSIS — Z9079 Acquired absence of other genital organ(s): Secondary | ICD-10-CM

## 2019-03-06 DIAGNOSIS — R531 Weakness: Secondary | ICD-10-CM | POA: Diagnosis not present

## 2019-03-06 DIAGNOSIS — R6889 Other general symptoms and signs: Secondary | ICD-10-CM | POA: Diagnosis not present

## 2019-03-06 DIAGNOSIS — F1010 Alcohol abuse, uncomplicated: Secondary | ICD-10-CM | POA: Diagnosis present

## 2019-03-06 DIAGNOSIS — R3915 Urgency of urination: Secondary | ICD-10-CM | POA: Diagnosis present

## 2019-03-06 DIAGNOSIS — Z8249 Family history of ischemic heart disease and other diseases of the circulatory system: Secondary | ICD-10-CM

## 2019-03-06 DIAGNOSIS — K219 Gastro-esophageal reflux disease without esophagitis: Secondary | ICD-10-CM | POA: Diagnosis present

## 2019-03-06 DIAGNOSIS — R7989 Other specified abnormal findings of blood chemistry: Secondary | ICD-10-CM | POA: Diagnosis present

## 2019-03-06 DIAGNOSIS — F209 Schizophrenia, unspecified: Secondary | ICD-10-CM | POA: Diagnosis present

## 2019-03-06 DIAGNOSIS — R000 Tachycardia, unspecified: Secondary | ICD-10-CM | POA: Diagnosis not present

## 2019-03-06 DIAGNOSIS — R55 Syncope and collapse: Secondary | ICD-10-CM | POA: Diagnosis not present

## 2019-03-06 DIAGNOSIS — D62 Acute posthemorrhagic anemia: Principal | ICD-10-CM | POA: Diagnosis present

## 2019-03-06 DIAGNOSIS — I10 Essential (primary) hypertension: Secondary | ICD-10-CM | POA: Diagnosis present

## 2019-03-06 DIAGNOSIS — D649 Anemia, unspecified: Secondary | ICD-10-CM | POA: Diagnosis not present

## 2019-03-06 DIAGNOSIS — K5909 Other constipation: Secondary | ICD-10-CM | POA: Diagnosis present

## 2019-03-06 DIAGNOSIS — N400 Enlarged prostate without lower urinary tract symptoms: Secondary | ICD-10-CM | POA: Diagnosis present

## 2019-03-06 DIAGNOSIS — E876 Hypokalemia: Secondary | ICD-10-CM | POA: Diagnosis present

## 2019-03-06 HISTORY — DX: Anemia, unspecified: D64.9

## 2019-03-06 LAB — CBC WITH DIFFERENTIAL/PLATELET
Abs Immature Granulocytes: 0.02 10*3/uL (ref 0.00–0.07)
Basophils Absolute: 0 10*3/uL (ref 0.0–0.1)
Basophils Relative: 0 %
Eosinophils Absolute: 0 10*3/uL (ref 0.0–0.5)
Eosinophils Relative: 1 %
HCT: 23.9 % — ABNORMAL LOW (ref 39.0–52.0)
Hemoglobin: 8.9 g/dL — ABNORMAL LOW (ref 13.0–17.0)
Immature Granulocytes: 0 %
Lymphocytes Relative: 18 %
Lymphs Abs: 1.3 10*3/uL (ref 0.7–4.0)
MCH: 38.7 pg — ABNORMAL HIGH (ref 26.0–34.0)
MCHC: 37.2 g/dL — ABNORMAL HIGH (ref 30.0–36.0)
MCV: 103.9 fL — ABNORMAL HIGH (ref 80.0–100.0)
Monocytes Absolute: 0.5 10*3/uL (ref 0.1–1.0)
Monocytes Relative: 7 %
Neutro Abs: 5.2 10*3/uL (ref 1.7–7.7)
Neutrophils Relative %: 74 %
Platelets: 128 10*3/uL — ABNORMAL LOW (ref 150–400)
RBC: 2.3 MIL/uL — ABNORMAL LOW (ref 4.22–5.81)
RDW: 13.2 % (ref 11.5–15.5)
WBC: 7.1 10*3/uL (ref 4.0–10.5)
nRBC: 0 % (ref 0.0–0.2)

## 2019-03-06 LAB — HEPATIC FUNCTION PANEL
ALT: 52 U/L — ABNORMAL HIGH (ref 0–44)
AST: 43 U/L — ABNORMAL HIGH (ref 15–41)
Albumin: 2.8 g/dL — ABNORMAL LOW (ref 3.5–5.0)
Alkaline Phosphatase: 42 U/L (ref 38–126)
Bilirubin, Direct: 0.1 mg/dL (ref 0.0–0.2)
Indirect Bilirubin: 0.5 mg/dL (ref 0.3–0.9)
Total Bilirubin: 0.6 mg/dL (ref 0.3–1.2)
Total Protein: 5.6 g/dL — ABNORMAL LOW (ref 6.5–8.1)

## 2019-03-06 LAB — SARS CORONAVIRUS 2 (TAT 6-24 HRS): SARS Coronavirus 2: NEGATIVE

## 2019-03-06 LAB — TYPE AND SCREEN
ABO/RH(D): O NEG
Antibody Screen: NEGATIVE

## 2019-03-06 LAB — MAGNESIUM: Magnesium: 2.1 mg/dL (ref 1.7–2.4)

## 2019-03-06 LAB — PROTIME-INR
INR: 1.2 (ref 0.8–1.2)
Prothrombin Time: 15.5 seconds — ABNORMAL HIGH (ref 11.4–15.2)

## 2019-03-06 LAB — VITAMIN B12: Vitamin B-12: 276 pg/mL (ref 180–914)

## 2019-03-06 LAB — COMPREHENSIVE METABOLIC PANEL
ALT: 57 U/L — ABNORMAL HIGH (ref 0–44)
AST: 56 U/L — ABNORMAL HIGH (ref 15–41)
Albumin: 2.9 g/dL — ABNORMAL LOW (ref 3.5–5.0)
Alkaline Phosphatase: 38 U/L (ref 38–126)
Anion gap: 7 (ref 5–15)
BUN: 11 mg/dL (ref 8–23)
CO2: 26 mmol/L (ref 22–32)
Calcium: 8.6 mg/dL — ABNORMAL LOW (ref 8.9–10.3)
Chloride: 109 mmol/L (ref 98–111)
Creatinine, Ser: 1.06 mg/dL (ref 0.61–1.24)
GFR calc Af Amer: >60 mL/min (ref >60)
GFR calc non Af Amer: >60 mL/min (ref >60)
Glucose, Bld: 127 mg/dL — ABNORMAL HIGH (ref 70–99)
Potassium: 3.6 mmol/L (ref 3.5–5.1)
Sodium: 142 mmol/L (ref 135–145)
Total Bilirubin: 0.7 mg/dL (ref 0.3–1.2)
Total Protein: 5.4 g/dL — ABNORMAL LOW (ref 6.5–8.1)

## 2019-03-06 LAB — HEMOGLOBIN AND HEMATOCRIT, BLOOD
HCT: 27.3 % — ABNORMAL LOW (ref 39.0–52.0)
Hemoglobin: 9 g/dL — ABNORMAL LOW (ref 13.0–17.0)

## 2019-03-06 LAB — TROPONIN I (HIGH SENSITIVITY)
Troponin I (High Sensitivity): 227 ng/L (ref <18)
Troponin I (High Sensitivity): 211 ng/L (ref <18)

## 2019-03-06 LAB — ETHANOL: Alcohol, Ethyl (B): <10 mg/dL (ref <10)

## 2019-03-06 LAB — PHOSPHORUS: Phosphorus: 3 mg/dL (ref 2.5–4.6)

## 2019-03-06 LAB — TSH: TSH: 0.771 u[IU]/mL (ref 0.350–4.500)

## 2019-03-06 LAB — ABO/RH: ABO/RH(D): O NEG

## 2019-03-06 LAB — APTT: aPTT: 30 seconds (ref 24–36)

## 2019-03-06 MED ORDER — POLYETHYLENE GLYCOL 3350 17 G PO PACK: 17.0000 g | PACK | Freq: Every day | ORAL | Status: DC | PRN

## 2019-03-06 MED ORDER — TRAZODONE HCL 50 MG PO TABS
50.0000 mg | ORAL_TABLET | Freq: Once | ORAL | Status: AC
  Administered 2019-03-06: 50 mg via ORAL
  Filled 2019-03-06: qty 1

## 2019-03-06 MED ORDER — SODIUM CHLORIDE 0.9 % IV SOLN
INTRAVENOUS | Status: DC
  Administered 2019-03-06 – 2019-03-08 (×3): via INTRAVENOUS

## 2019-03-06 MED ORDER — ADENOSINE 6 MG/2ML IV SOLN
INTRAVENOUS | Status: AC
  Filled 2019-03-06: qty 4

## 2019-03-06 MED ORDER — ONDANSETRON HCL 4 MG/2ML IJ SOLN: 4.0000 mg | Freq: Four times a day (QID) | INTRAMUSCULAR | Status: DC | PRN

## 2019-03-06 MED ORDER — SODIUM CHLORIDE 0.9 % IV BOLUS
1000.0000 mL | Freq: Once | INTRAVENOUS | Status: AC
  Administered 2019-03-06: 10:00:00 1000 mL via INTRAVENOUS

## 2019-03-06 MED ORDER — ACETAMINOPHEN 325 MG PO TABS
650.0000 mg | ORAL_TABLET | Freq: Four times a day (QID) | ORAL | Status: DC | PRN
  Administered 2019-03-07: 650 mg via ORAL
  Filled 2019-03-06: qty 2

## 2019-03-06 MED ORDER — ACETAMINOPHEN 650 MG RE SUPP: 650.0000 mg | Freq: Four times a day (QID) | RECTAL | Status: DC | PRN

## 2019-03-06 MED ORDER — METOPROLOL TARTRATE 5 MG/5ML IV SOLN
INTRAVENOUS | Status: AC
  Administered 2019-03-06: 21:00:00 5 mg
  Filled 2019-03-06: qty 5

## 2019-03-06 MED ORDER — METOPROLOL TARTRATE 12.5 MG HALF TABLET
12.5000 mg | ORAL_TABLET | Freq: Four times a day (QID) | ORAL | Status: DC
  Administered 2019-03-06 – 2019-03-07 (×3): 12.5 mg via ORAL
  Filled 2019-03-06 (×3): qty 1

## 2019-03-06 MED ORDER — ONDANSETRON HCL 4 MG PO TABS: 4.0000 mg | ORAL_TABLET | Freq: Four times a day (QID) | ORAL | Status: DC | PRN

## 2019-03-06 NOTE — Consult Note (Signed)
Cardiology Consultation:   Patient ID: Billy Boyd; [id number]; May 03, 1956   Admit date: 03/06/2019 Date of Consult: 03/06/2019  Primary Care Provider: Patient, No Pcp Per Primary Cardiologist: No primary care provider on file.  Primary Electrophysiologist:  Dr. Johney Frame   Patient Profile:   Billy Boyd is a 63 y.o. male with a hx of SVT who is being seen today for the evaluation of SVT and elevated enzymes  at the request of Dr. Jacqulyn Bath.  History of Present Illness:   Billy Boyd hs a history of SVT and was seen by Dr. Johney Frame last in 2017.  At that time he developed atrial fib when given adenosine.  He was managed medically and was to have follow up with Dr. Johney Frame but I don't see that this happened.  I was able to confirm the SVT at 21:06 on 12/15/18 EKG.    He presents now with rapid heart rate.  The history is mostly given by his sister.  He is pleasant but confused about details.  She called EMS this morning mostly because he was having blood in his Foley catheter.  He status post TURP.  She said he walked into the bathroom.  And she thought he got presyncopal because he was seen the blood.  He said his fingers felt tingly.  He does seem to lose to the fact that his heart rate was going fast but he could not really quantify or qualify this.  There may been some chest discomfort but he really cannot elaborate on this.  She said he looked like he was trying to go to sleep.  This may have been presyncope.  There was not any actual loss of consciousness.  When EMS arrived he was found to have SVT.  He was treated with adenosine x 2.  Post cardioversion EKG with any acute ST changes as below.  However, hsTrop was mildly elevated.  He is status post a TURP on 9/6.  Of note his Hgb has fallen in 3 days from 14 to 8.9.  I did review this and he had narrow complex tachycardia with a rate of around 200 with no discernible retrograde PE.   Past Medical History:  Diagnosis Date   Benign localized  prostatic hyperplasia with lower urinary tract symptoms (LUTS)    History of alcohol abuse    History of chest pain    ED visit 01-17-2016 dx unspecified chest pain, GERD, hyponatremia   History of supraventricular tachycardia    ED visit 12-15-2015;  per d/c note by dr Johney Frame (cardiology) to follow up as outpt for ETT and Echo   History of syncope    ED visit 01-27-2017 w/ LOC-- situational (bowel movement) and vasovagal   Schizophrenia, chronic condition (HCC)     Past Surgical History:  Procedure Laterality Date   TRANSURETHRAL RESECTION OF PROSTATE N/A 03/03/2019   Procedure: TRANSURETHRAL RESECTION OF THE PROSTATE (TURP);  Surgeon: Malen Gauze, MD;  Location: [facility];  Service: Urology;  Laterality: N/A;    Prior to Admission medications   Medication Sig Start Date End Date Taking? Authorizing Provider  BIOTIN FORTE PO Take 1 tablet by mouth daily at 12 noon.   Yes [provider]  Cyanocobalamin (B-12 PO) Take 1 tablet by mouth daily at 12 noon.   Yes [provider]  GINSENG PO Take 1 tablet by mouth daily at 12 noon.   Yes [provider]  oxyCODONE-acetaminophen (PERCOCET/ROXICET) 5-325 MG tablet Take 1 tablet  by mouth every 4 (four) hours as needed for moderate pain. 03/04/19  Yes McKenzie, Mardene CelestePatrick L, MD  risperiDONE (RISPERDAL) 1 MG tablet Take 1 mg by mouth 2 (two) times daily.    Yes [provider]  VITAMIN D PO Take 1 tablet by mouth daily at 12 noon.   Yes [provider]  VITAMIN E PO Take 1 tablet by mouth daily at 12 noon.   Yes [provider]  omeprazole (PRILOSEC) 20 MG capsule Take 1 capsule (20 mg total) by mouth daily. Patient not taking: Reported on 03/06/2019 01/17/16   Benjiman CorePickering, Nathan, MD    Inpatient Medications: Scheduled Meds:  Continuous Infusions:  sodium chloride 75 mL/hr at 03/06/19 1614   PRN Meds: acetaminophen **OR** acetaminophen, ondansetron **OR** ondansetron  (ZOFRAN) IV, polyethylene glycol  Allergies:   No Known Allergies  Social History:   Social History   Socioeconomic History   Marital status: Married    Spouse name: Not on file   Number of children: Not on file   Years of education: Not on file   Highest education level: Not on file  Occupational History   Not on file  Social Needs   Financial resource strain: Not on file   Food insecurity    Worry: Not on file    Inability: Not on file   Transportation needs    Medical: Not on file    Non-medical: Not on file  Tobacco Use   Smoking status: Never Smoker   Smokeless tobacco: Never Used  Substance and Sexual Activity   Alcohol use: No   Drug use: No   Sexual activity: Not on file  Lifestyle   Physical activity    Days per week: Not on file    Minutes per session: Not on file   Stress: Not on file  Relationships   Social connections    Talks on phone: Not on file    Gets together: Not on file    Attends religious service: Not on file    Active member of club or organization: Not on file    Attends meetings of clubs or organizations: Not on file    Relationship status: Not on file   Intimate partner violence    Fear of current or ex partner: Not on file    Emotionally abused: Not on file    Physically abused: Not on file    Forced sexual activity: Not on file  Other Topics Concern   Not on file  Social History Narrative   Lives with sister.      Family History:   Family History  Problem Relation Age of Onset   Heart disease Mother        Pacemaker     ROS:  Please see the history of present illness.  ROS  All other ROS reviewed and negative.     Physical Exam/Data:   Vitals:   03/06/19 1430 03/06/19 1500 03/06/19 1530 03/06/19 1600  BP: (!) 97/58 (!) 93/54 132/72 111/67  Pulse: 73 69 80 92  Resp: 17 12 (!) 23 20  Temp:      TempSrc:      SpO2: 100% 100% 99% 100%    Intake/Output Summary (Last 24 hours) at 03/06/2019 1744 Last  data filed at 03/06/2019 1454 Gross per 24 hour  Intake 1000 ml  Output 1000 ml  Net 0 ml   There were no vitals filed for this visit. There is no height or weight on  file to calculate BMI.  GENERAL:  No distress HEENT:   Pupils equal round and reactive, fundi not visualized, oral mucosa unremarkable NECK:  No  jugular venous distention, waveform within normal limits, carotid upstroke brisk and symmetric, no bruits, no thyromegaly LYMPHATICS:  No cervical, inguinal adenopathy LUNGS:   Clear to auscultation bilaterally BACK:  No CVA tenderness CHEST:   Unremarkable HEART:  PMI not displaced or sustained,S1 and S2 within normal limits, no S3, no S4, no clicks, no rubs, no murmurs ABD:  Flat, positive bowel sounds normal in frequency in pitch, no bruits, no rebound, no guarding, no midline pulsatile mass, no hepatomegaly, no splenomegaly EXT:  2 plus pulses throughout, no  edema, no cyanosis no clubbing SKIN:  No rashes no nodules NEURO:   Cranial nerves II through XII grossly intact, motor grossly intact throughout PSYCH:    Cognitively intact, oriented to person place and time   EKG:  The EKG was personally reviewed and demonstrates:  Sinus rhythm, rate 72, axis within normal limits, intervals within normal limits, no acute ST-T wave changes. Telemetry:  Telemetry was personally reviewed and demonstrates:  NSR   Relevant CV Studies: NA  Laboratory Data:  Chemistry Recent Labs  Lab 03/03/19 1616 03/04/19 0428 03/06/19 0824  NA 142 139 142  K 3.9 4.8 3.6  CL 108 109 109  CO2 25 26 26   GLUCOSE 141* 111* 127*  BUN 17 14 11   CREATININE 0.96 0.92 1.06  CALCIUM 8.4* 8.4* 8.6*  GFRNONAA >60 >60 >60  GFRAA >60 >60 >60  ANIONGAP 9 4* 7    Recent Labs  Lab 03/06/19 0824 03/06/19 1407  PROT 5.4* 5.6*  ALBUMIN 2.9* 2.8*  AST 56* 43*  ALT 57* 52*  ALKPHOS 38 42  BILITOT 0.7 0.6   Hematology Recent Labs  Lab 03/03/19 2234 03/04/19 0428 03/06/19 0824  WBC 5.8 6.8 7.1    RBC 3.35* 3.04* 2.30*  HGB 11.6* 10.1* 8.9*  HCT 35.2* 31.5* 23.9*  MCV 105.1* 103.6* 103.9*  MCH 34.6* 33.2 38.7*  MCHC 33.0 32.1 37.2*  RDW 13.3 13.2 13.2  PLT 151 133* 128*   Cardiac EnzymesNo results for input(s): TROPONINI in the last 168 hours. No results for input(s): TROPIPOC in the last 168 hours.  BNPNo results for input(s): BNP, PROBNP in the last 168 hours.  DDimer No results for input(s): DDIMER in the last 168 hours.  Radiology/Studies:  Dg Chest Port 1 View  Result Date: 03/06/2019 CLINICAL DATA:  Syncopal episode this morning.  SVT. EXAM: PORTABLE CHEST 1 VIEW COMPARISON:  12/18/2015; 12/15/2015 FINDINGS: Grossly unchanged borderline enlarged cardiac silhouette. Normal mediastinal contours. No focal parenchymal opacities. No pleural effusion or pneumothorax. No evidence of edema. No acute osseous abnormalities. IMPRESSION: Borderline cardiomegaly without superimposed acute cardiopulmonary disease. Specifically, no definite evidence of pulmonary edema. Electronically Signed   By: Simonne ComeJohn  Watts M.D.   On: 03/06/2019 08:39    Assessment and Plan:   SVT: As far as I can tell this is only the second episode of symptomatic SVT that this patient has had.  I question the patient and his sister carefully and he does not have complaints.  He is quite active.  He does a lot of exercising.  Previously when he had this problem was decided not to use beta-blockers but to just follow him and I think this is reasonable still.  I do not think with only his second event he needs an ablation.  ELEVATED TROPONIN: I suspect  this is demand related to the SVT.  He is very physically active and has no symptoms.  He can be screened with outpatient POET (Plain [employer] Exercise Treadmill)   For questions or updates, please contact [place] Please consult [email or web address] for contact info under Cardiology/STEMI.   Signed, Minus Breeding, MD  03/06/2019 5:44 PM

## 2019-03-06 NOTE — Discharge Summary (Signed)
Physician Discharge Summary  Patient ID: Billy Boyd MRN: [id number] DOB/AGE: 12/01/1955 63 y.o.  Admit date: 03/03/2019 Discharge date: 03/04/2019  Admission Diagnoses:  BPH Discharge Diagnoses:  Active Problems:   BPH (benign prostatic hyperplasia)   Past Medical History:  Diagnosis Date  . Benign localized prostatic hyperplasia with lower urinary tract symptoms (LUTS)   . History of alcohol abuse   . History of chest pain    ED visit 01-17-2016 dx unspecified chest pain, GERD, hyponatremia  . History of supraventricular tachycardia    ED visit 12-15-2015;  per d/c note by dr allred (cardiology) to follow up as outpt for ETT and Echo  . History of syncope    ED visit 01-27-2017 w/ LOC-- situational (bowel movement) and vasovagal  . Schizophrenia, chronic condition ([place])     Surgeries: Procedure(s): TRANSURETHRAL RESECTION OF THE PROSTATE (TURP) on 03/03/2019   Consultants (if any):   Discharged Condition: Improved  Hospital Course: Billy Boyd is an 63 y.o. male who was admitted 03/03/2019 with a diagnosis of BPH and went to the operating room on 03/03/2019 and underwent the above named procedures.  He did well and urine was clear on POD#1  He was given perioperative antibiotics:  Anti-infectives (From admission, onward)   Start     Dose/Rate Route Frequency Ordered Stop   03/03/19 1024  ceFAZolin (ANCEF) IVPB 2g/100 mL premix     2 g 200 mL/hr over 30 Minutes Intravenous 30 min pre-op 03/03/19 1024 03/03/19 1243    .  He was given sequential compression devices, early ambulation for DVT prophylaxis.  He benefited maximally from the hospital stay and there were no complications.    Recent vital signs:  Vitals:   03/04/19 0627 03/04/19 0929  BP: (!) 93/52 93/63  Pulse: (!) 51 64  Resp: 16 16  Temp: 97.6 F (36.4 C) 98 F (36.7 C)  SpO2: 97% 99%    Recent laboratory studies:  Lab Results  Component Value Date   HGB 8.9 (L) 03/06/2019   HGB 10.1 (L)  03/04/2019   HGB 11.6 (L) 03/03/2019   Lab Results  Component Value Date   WBC 7.1 03/06/2019   PLT 128 (L) 03/06/2019   No results found for: INR Lab Results  Component Value Date   NA 142 03/06/2019   K 3.6 03/06/2019   CL 109 03/06/2019   CO2 26 03/06/2019   BUN 11 03/06/2019   CREATININE 1.06 03/06/2019   GLUCOSE 127 (H) 03/06/2019    Discharge Medications:   Allergies as of 03/04/2019   No Known Allergies     Medication List    STOP taking these medications   benztropine 1 MG tablet Commonly known as: COGENTIN     TAKE these medications   omeprazole 20 MG capsule Commonly known as: PRILOSEC Take 1 capsule (20 mg total) by mouth daily.   oxyCODONE-acetaminophen 5-325 MG tablet Commonly known as: PERCOCET/ROXICET Take 1 tablet by mouth every 4 (four) hours as needed for moderate pain.   risperiDONE 1 MG tablet Commonly known as: RISPERDAL Take 1 mg by mouth 2 (two) times daily.       Diagnostic Studies: Dg Chest Port 1 View  Result Date: 03/06/2019 CLINICAL DATA:  Syncopal episode this morning.  SVT. EXAM: PORTABLE CHEST 1 VIEW COMPARISON:  12/18/2015; 12/15/2015 FINDINGS: Grossly unchanged borderline enlarged cardiac silhouette. Normal mediastinal contours. No focal parenchymal opacities. No pleural effusion or pneumothorax. No evidence of edema. No acute osseous abnormalities. IMPRESSION: Borderline cardiomegaly  without superimposed acute cardiopulmonary disease. Specifically, no definite evidence of pulmonary edema. Electronically Signed   By: Simonne ComeJohn  Watts M.D.   On: 03/06/2019 08:39    Disposition: Discharge disposition: 01-Home or Self Care       Discharge Instructions    Discharge patient   Complete by: As directed    Discharge disposition: 01-Home or Self Care   Discharge patient date: 03/04/2019         Signed: Wilkie Ayeatrick Cullen [place] 03/06/2019, 1:29 PM

## 2019-03-06 NOTE — ED Triage Notes (Addendum)
Pt arrives to ED from home with complaints of fainting this morning. Patient denies any LOC or fall but states he felt real weak this morning while walking to the bathroom. On EMS arrival pt was found to be in SVT with a rate in the 200's. Pt was given 6mg  of adenosine and converted to NSR then right back into SVT. Pt was then given 12mg  of adenosine and converted to NSR. Patient currently in NSR with HR in 70's. Pt states he never experienced CP or SOB.

## 2019-03-06 NOTE — ED Provider Notes (Signed)
[facility] EMERGENCY DEPARTMENT Provider Note   CSN: [id number] Arrival date & time: 03/06/19  0756     [facility]   Chief Complaint Chief Complaint  Patient presents with  . SVT    HPI Billy Boyd is a 63 y.o. male.     HPI Patient with a history of schizophrenia, SVT presents after an episode of chest pain, that has resolved. Patient notes that he was feeling weak, and the hours prior to ED arrival, worse with exertion, but without chest pain. He notes that he had some diaphoresis as well, again worse with exertion. However, patient did not have syncope, did not have obvious dyspnea.  Patient called EMS, and on their arrival they found him to have a heart rate in the 200s, concerning for supraventricular tachycardia. Patient received 2 boluses of adenosine, with second dose resulting in sustained return to normal sinus rhythm.  Currently patient has no complaints.  Notes that he has been taking his medication as directed, has had no recent health issues, and is comfortable.    Past Medical History:  Diagnosis Date  . Benign localized prostatic hyperplasia with lower urinary tract symptoms (LUTS)   . History of alcohol abuse   . History of chest pain    ED visit 01-17-2016 dx unspecified chest pain, GERD, hyponatremia  . History of supraventricular tachycardia    ED visit 12-15-2015;  per d/c note by dr allred (cardiology) to follow up as outpt for ETT and Echo  . History of syncope    ED visit 01-27-2017 w/ LOC-- situational (bowel movement) and vasovagal  . Schizophrenia, chronic condition [facility](HCC)     Patient Active Problem List   Diagnosis Date Noted  . BPH (benign prostatic hyperplasia) 03/03/2019  . Hypokalemia 12/16/2015  . SVT (supraventricular tachycardia) (HCC) 12/15/2015    Past Surgical History:  Procedure Laterality Date  . TRANSURETHRAL RESECTION OF PROSTATE N/A 03/03/2019   Procedure: TRANSURETHRAL RESECTION OF THE PROSTATE (TURP);   Surgeon: Malen GauzeMcKenzie, Patrick L, MD;  [facility];  Service: Urology;  Laterality: N/A;        Home Medications    Prior to Admission medications   Medication Sig Start Date End Date Taking? Authorizing Provider  BIOTIN FORTE PO Take 1 tablet by mouth daily at 12 noon.   Yes [provider]  Cyanocobalamin (B-12 PO) Take 1 tablet by mouth daily at 12 noon.   Yes [provider]  GINSENG PO Take 1 tablet by mouth daily at 12 noon.   Yes [provider]  oxyCODONE-acetaminophen (PERCOCET/ROXICET) 5-325 MG tablet Take 1 tablet by mouth every 4 (four) hours as needed for moderate pain. 03/04/19  Yes McKenzie, Mardene CelestePatrick L, MD  risperiDONE (RISPERDAL) 1 MG tablet Take 1 mg by mouth 2 (two) times daily.    Yes [provider]  VITAMIN D PO Take 1 tablet by mouth daily at 12 noon.   Yes [provider]  VITAMIN E PO Take 1 tablet by mouth daily at 12 noon.   Yes [provider]  omeprazole (PRILOSEC) 20 MG capsule Take 1 capsule (20 mg total) by mouth daily. Patient not taking: Reported on 03/06/2019 01/17/16   Benjiman CorePickering, Nathan, MD    Family History History reviewed. No pertinent family history.  Social History Social History   Tobacco Use  . Smoking status: Never Smoker  . Smokeless tobacco: Never Used  Substance Use Topics  . Alcohol use: No  . Drug use:  No     Allergies   Patient has no known allergies.   Review of Systems Review of Systems  Constitutional:       Per HPI, otherwise negative  HENT:       Per HPI, otherwise negative  Respiratory:       Per HPI, otherwise negative  Cardiovascular:       Per HPI, otherwise negative  Gastrointestinal: Negative for vomiting.  Endocrine:       Negative aside from HPI  Genitourinary:       Neg aside from HPI   Musculoskeletal:       Per HPI, otherwise negative  Skin: Negative.   Neurological: Negative for syncope.  Psychiatric/Behavioral:       Per  HPI, schizophrenia, compliant with medication.     Physical Exam Updated Vital Signs BP 108/69   Pulse 80   Temp 99 F (37.2 C) (Oral)   Resp 16   SpO2 99%   Physical Exam Vitals signs and nursing note reviewed.  Constitutional:      General: He is not in acute distress.    Appearance: He is well-developed.  HENT:     Head: Normocephalic and atraumatic.  Eyes:     Conjunctiva/sclera: Conjunctivae normal.  Cardiovascular:     Rate and Rhythm: Normal rate and regular rhythm.  Pulmonary:     Effort: Pulmonary effort is normal. No respiratory distress.     Breath sounds: No stridor.  Abdominal:     General: There is no distension.  Skin:    General: Skin is warm and dry.  Neurological:     Mental Status: He is alert and oriented to person, place, and time.      ED Treatments / Results  Labs (all labs ordered are listed, but only abnormal results are displayed) Labs Reviewed  COMPREHENSIVE METABOLIC PANEL - Abnormal; Notable for the following components:      Result Value   Glucose, Bld 127 (*)    Calcium 8.6 (*)    Total Protein 5.4 (*)    Albumin 2.9 (*)    AST 56 (*)    ALT 57 (*)    All other components within normal limits  CBC WITH DIFFERENTIAL/PLATELET - Abnormal; Notable for the following components:   RBC 2.30 (*)    Hemoglobin 8.9 (*)    HCT 23.9 (*)    MCV 103.9 (*)    MCH 38.7 (*)    MCHC 37.2 (*)    Platelets 128 (*)    All other components within normal limits  SARS CORONAVIRUS 2 (TAT 6-24 HRS)  ETHANOL  TYPE AND SCREEN  ABO/RH    EMS rhythm strips reviewed, consistent with SVT, with conversion to sinus rhythm.  EKG EKG Interpretation  Date/Time:  Sunday March 06 2019 08:24:40 EDT Ventricular Rate:  72 PR Interval:  158 QRS Duration: 82 QT Interval:  344 QTC Calculation: 376 R Axis:   21 Text Interpretation:  Normal sinus rhythm Normal ECG Confirmed by Carmin Muskrat [phone]) on 03/06/2019 8:40:17 AM   Radiology Dg Chest Port  1 View  Result Date: 03/06/2019 CLINICAL DATA:  Syncopal episode this morning.  SVT. EXAM: PORTABLE CHEST 1 VIEW COMPARISON:  12/18/2015; 12/15/2015 FINDINGS: Grossly unchanged borderline enlarged cardiac silhouette. Normal mediastinal contours. No focal parenchymal opacities. No pleural effusion or pneumothorax. No evidence of edema. No acute osseous abnormalities. IMPRESSION: Borderline cardiomegaly without superimposed acute cardiopulmonary disease. Specifically, no definite evidence of pulmonary edema. Electronically Signed  By: Simonne Come M.D.   On: 03/06/2019 08:39    Procedures Procedures (including critical care time)  Medications Ordered in ED Medications  sodium chloride 0.9 % bolus 1,000 mL (1,000 mLs Intravenous New Bag/Given 03/06/19 0935)     Initial Impression / Assessment and Plan / ED Course  I have reviewed the triage vital signs and the nursing notes.  Pertinent labs & imaging results that were available during my care of the patient were reviewed by me and considered in my medical decision making (see chart for details).    Patient is unaware of history of SVT, but on chart review is clear the patient has had episodes going back at least the last 3 years.     I discussed the patient's case with our urology colleagues, given concern for hemoglobin drop, recent transurethral prostate resection, and ongoing gross hematuria.  Update:, Patient remains in sinus rhythm, though with persistently mild low blood pressure.   12:58 PM  Patient and sister aware of all findings, including hemoglobin drop, ongoing hematuria, and need for admission given concern for symptomatic anemia, possibly contributing to his episode of SVT. He has no ongoing chest pain, no dyspnea, there is no evidence for ongoing coronary ischemia.  This adult male with history of SVT, presents several days after transurethral prostate resection, now following an episode of SVT. Some suspicion for the  patient's anemia, hypertension contributing to the episode, which resolved prior to ED arrival after being provided adenosine in the ambulance. Here patient is found to have substantial drop in hemoglobin 5 g/daL over 5 days, likely contributing to his presentation. Patient received initial fluid resuscitation, type and screen, continuous monitoring, was admitted for further monitoring, management.  Final Clinical Impressions(s) / ED Diagnoses   Final diagnoses:  SVT (supraventricular tachycardia) (HCC)  Symptomatic anemia  Hemoglobinuria    CRITICAL CARE Performed by: Gerhard Munch Total critical care time: 35 minutes Critical care time was exclusive of separately billable procedures and treating other patients. Critical care was necessary to treat or prevent imminent or life-threatening deterioration. Critical care was time spent personally by me on the following activities: development of treatment plan with patient and/or surrogate as well as nursing, discussions with consultants, evaluation of patient's response to treatment, examination of patient, obtaining history from patient or surrogate, ordering and performing treatments and interventions, ordering and review of laboratory studies, ordering and review of radiographic studies, pulse oximetry and re-evaluation of patient's condition.    Gerhard Munch, MD 03/06/19 1302

## 2019-03-06 NOTE — Consult Note (Addendum)
Subjective: CC: Hematuria.  Hx: Mr. Billy Boyd is s/p TURP on 9/3 by Dr. Ronne BinningMcKenzie.  I was called in consultation by Dr. Jeraldine LootsLockwood for hematuria with anemia with a decline in the Hgb to 8.9 from 14 preop.  The patient was discharged initially on 9/4 but was back in the ER that day with catheter issues and some hematuria but the catheter irrigated well. His H&H was 10.1 that day.   He had the onset today of a rapid heart rate, tingling in the left hand and weakness along with increased hematuria.  He was found the the ER to have SVT which he has had previously.  He is feeling better now and the urine is only light pink now with good UOP and no clots.   ROS:  Review of Systems  Constitutional: Negative for chills and fever.  Respiratory: Negative for shortness of breath.   Cardiovascular: Negative for chest pain.  Gastrointestinal: Negative for abdominal pain and nausea.  All other systems reviewed and are negative.   No Known Allergies  Past Medical History:  Diagnosis Date  . Benign localized prostatic hyperplasia with lower urinary tract symptoms (LUTS)   . History of alcohol abuse   . History of chest pain    ED visit 01-17-2016 dx unspecified chest pain, GERD, hyponatremia  . History of supraventricular tachycardia    ED visit 12-15-2015;  per d/c note by dr allred (cardiology) to follow up as outpt for ETT and Echo  . History of syncope    ED visit 01-27-2017 w/ LOC-- situational (bowel movement) and vasovagal  . Schizophrenia, chronic condition [facility](HCC)     Past Surgical History:  Procedure Laterality Date  . TRANSURETHRAL RESECTION OF PROSTATE N/A 03/03/2019   Procedure: TRANSURETHRAL RESECTION OF THE PROSTATE (TURP);  Surgeon: Malen GauzeMcKenzie, Patrick L, MD;  [facility];  Service: Urology;  Laterality: N/A;    Social History   Socioeconomic History  . Marital status: Married    Spouse name: Not on file  . Number of children: Not on file  . Years of  education: Not on file  . Highest education level: Not on file  Occupational History  . Not on file  Social Needs  . Financial resource strain: Not on file  . Food insecurity    Worry: Not on file    Inability: Not on file  . Transportation needs    Medical: Not on file    Non-medical: Not on file  Tobacco Use  . Smoking status: Never Smoker  . Smokeless tobacco: Never Used  Substance and Sexual Activity  . Alcohol use: No  . Drug use: No  . Sexual activity: Not on file  Lifestyle  . Physical activity    Days per week: Not on file    Minutes per session: Not on file  . Stress: Not on file  Relationships  . Social Musicianconnections    Talks on phone: Not on file    Gets together: Not on file    Attends religious service: Not on file    Active member of club or organization: Not on file    Attends meetings of clubs or organizations: Not on file    Relationship status: Not on file  . Intimate partner violence    Fear of current or ex partner: Not on file    Emotionally abused: Not on file    Physically abused: Not on file    Forced sexual activity: Not on file  Other  Topics Concern  . Not on file  Social History Narrative  . Not on file    History reviewed. No pertinent family history.  Anti-infectives: Anti-infectives (From admission, onward)   None      Current Facility-Administered Medications  Medication Dose Route Frequency Provider Last Rate Last Dose  . 0.9 %  sodium chloride infusion   Intravenous Continuous Pahwani, Rinka R, MD      . acetaminophen (TYLENOL) tablet 650 mg  650 mg Oral Q6H PRN Pahwani, Rinka R, MD       Or  . acetaminophen (TYLENOL) suppository 650 mg  650 mg Rectal Q6H PRN Pahwani, Rinka R, MD      . ondansetron (ZOFRAN) tablet 4 mg  4 mg Oral Q6H PRN Pahwani, Rinka R, MD       Or  . ondansetron (ZOFRAN) injection 4 mg  4 mg Intravenous Q6H PRN Pahwani, Rinka R, MD      . polyethylene glycol (MIRALAX / GLYCOLAX) packet 17 g  17 g Oral Daily  PRN Pahwani, Rinka R, MD       Current Outpatient Medications  Medication Sig Dispense Refill  . BIOTIN FORTE PO Take 1 tablet by mouth daily at 12 noon.    . Cyanocobalamin (B-12 PO) Take 1 tablet by mouth daily at 12 noon.    Marland Kitchen GINSENG PO Take 1 tablet by mouth daily at 12 noon.    Marland Kitchen oxyCODONE-acetaminophen (PERCOCET/ROXICET) 5-325 MG tablet Take 1 tablet by mouth every 4 (four) hours as needed for moderate pain. 30 tablet 0  . risperiDONE (RISPERDAL) 1 MG tablet Take 1 mg by mouth 2 (two) times daily.     Marland Kitchen VITAMIN D PO Take 1 tablet by mouth daily at 12 noon.    Marland Kitchen VITAMIN E PO Take 1 tablet by mouth daily at 12 noon.    Marland Kitchen omeprazole (PRILOSEC) 20 MG capsule Take 1 capsule (20 mg total) by mouth daily. (Patient not taking: Reported on 03/06/2019) 14 capsule 0     Objective: Vital signs in last 24 hours: Temp:  [99 F (37.2 C)] 99 F (37.2 C) (09/06 0820) Pulse Rate:  [69-87] 73 (09/06 1430) Resp:  [10-21] 17 (09/06 1430) BP: (93-128)/(55-83) 97/58 (09/06 1430) SpO2:  [98 %-100 %] 100 % (09/06 1430)  Intake/Output from previous day: No intake/output data recorded. Intake/Output this shift: Total I/O In: -  Out: 1000 [Urine:1000]   Physical Exam Vitals signs reviewed.  Constitutional:      Appearance: Normal appearance.  HENT:     Head: Normocephalic and atraumatic.  Neck:     Musculoskeletal: Normal range of motion and neck supple.  Cardiovascular:     Rate and Rhythm: Normal rate and regular rhythm.     Heart sounds: Normal heart sounds.  Pulmonary:     Effort: Pulmonary effort is normal. No respiratory distress.     Breath sounds: Normal breath sounds.  Abdominal:     General: Abdomen is flat.     Palpations: Abdomen is soft. There is no mass.  Genitourinary:    Comments: 3 way foley indwelling with light pink urine in the tubing.   Musculoskeletal: Normal range of motion.        General: No swelling.  Skin:    General: Skin is warm and dry.  Neurological:      General: No focal deficit present.     Mental Status: He is alert and oriented to person, place, and time.  Psychiatric:  Mood and Affect: Mood normal.        Behavior: Behavior normal.     Lab Results:  Recent Labs    03/04/19 0428 03/06/19 0824  WBC 6.8 7.1  HGB 10.1* 8.9*  HCT 31.5* 23.9*  PLT 133* 128*   BMET Recent Labs    03/04/19 0428 03/06/19 0824  NA 139 142  K 4.8 3.6  CL 109 109  CO2 26 26  GLUCOSE 111* 127*  BUN 14 11  CREATININE 0.92 1.06  CALCIUM 8.4* 8.6*   PT/INR Recent Labs    03/06/19 1407  LABPROT 15.5*  INR 1.2   ABG No results for input(s): PHART, HCO3 in the last 72 hours.  Invalid input(s): PCO2, PO2  Studies/Results: Dg Chest Port 1 View  Result Date: 03/06/2019 CLINICAL DATA:  Syncopal episode this morning.  SVT. EXAM: PORTABLE CHEST 1 VIEW COMPARISON:  12/18/2015; 12/15/2015 FINDINGS: Grossly unchanged borderline enlarged cardiac silhouette. Normal mediastinal contours. No focal parenchymal opacities. No pleural effusion or pneumothorax. No evidence of edema. No acute osseous abnormalities. IMPRESSION: Borderline cardiomegaly without superimposed acute cardiopulmonary disease. Specifically, no definite evidence of pulmonary edema. Electronically Signed   By: Sandi Mariscal M.D.   On: 03/06/2019 08:39     Assessment: Acute blood loss anemia post TURP.  His Hgb is down to 8.9 from 14 preop and he had an associated episode of SVT.   His urine is only light pink now with good output.   Monitor urine and repeat H&H in AM.   If the urine continues to clear the foley catheter could be removed.     CC: Dr. Carmin Muskrat.      Irine Seal 03/06/2019 [phone]

## 2019-03-06 NOTE — ED Notes (Signed)
ED TO INPATIENT HANDOFF REPORT  ED Nurse Name and Phone #: Susa Raring [phone]  S Name/Age/Gender Billy Boyd 63 y.o. male Room/Bed: 013C/013C  Code Status   Code Status: Full Code  Home/SNF/Other Home Patient oriented to: self, place, time and situation Is this baseline? Yes   Triage Complete: Triage complete  Chief Complaint SVT  Triage Note Pt arrives to ED from home with complaints of fainting this morning. Patient denies any LOC or fall but states he felt real weak this morning while walking to the bathroom. On EMS arrival pt was found to be in SVT with a rate in the 200's. Pt was given 6mg  of adenosine and converted to NSR then right back into SVT. Pt was then given 12mg  of adenosine and converted to NSR. Patient currently in NSR with HR in 70's. Pt states he never experienced CP or SOB.     Allergies No Known Allergies  Level of Care/Admitting Diagnosis ED Disposition    ED Disposition Condition Comment   Admit  Hospital Area: [facility] [[id number]]  Level of Care: Telemetry Cardiac [103]  I expect the patient will be discharged within 24 hours: Yes  LOW acuity---Tx typically complete <24 hrs---ACUTE conditions typically can be evaluated <24 hours---LABS likely to return to acceptable levels <24 hours---IS near functional baseline---EXPECTED to return to current living arrangement---NOT newly hypoxic: Does not meet criteria for 5C-Observation unit  Covid Evaluation: Asymptomatic Screening Protocol (No Symptoms)  Diagnosis: Anemia associated with acute blood loss [962952]  Admitting Physician: Ollen Bowl [[id number]]  Attending Physician: Ollen Bowl [id number]  PT Class (Do Not Modify): Observation [104]  PT Acc Code (Do Not Modify): Observation [10022]       B Medical/Surgery History Past Medical History:  Diagnosis Date  . Benign localized prostatic hyperplasia with lower urinary tract symptoms (LUTS)   . History of alcohol abuse   .  History of chest pain    ED visit 01-17-2016 dx unspecified chest pain, GERD, hyponatremia  . History of supraventricular tachycardia    ED visit 12-15-2015;  per d/c note by dr allred (cardiology) to follow up as outpt for ETT and Echo  . History of syncope    ED visit 01-27-2017 w/ LOC-- situational (bowel movement) and vasovagal  . Schizophrenia, chronic condition [facility])    Past Surgical History:  Procedure Laterality Date  . TRANSURETHRAL RESECTION OF PROSTATE N/A 03/03/2019   Procedure: TRANSURETHRAL RESECTION OF THE PROSTATE (TURP);  Surgeon: Malen Gauze, MD;  Location: [facility];  Service: Urology;  Laterality: N/A;     A IV Location/Drains/Wounds Patient Lines/Drains/Airways Status   Active Line/Drains/Airways    Name:   Placement date:   Placement time:   Site:   Days:   Peripheral IV 01/27/17 Left Forearm   01/27/17    0600    Forearm   768   Peripheral IV 03/06/19 Left Antecubital   03/06/19    0934    Antecubital   less than 1   Urethral Catheter DR [facility] KENZIE Latex;Triple-lumen 22 Fr.   03/03/19    1356    Latex;Triple-lumen   3   Incision (Closed) 03/03/19 Perineum Other (Comment)   03/03/19    1320     3          Intake/Output Last 24 hours  Intake/Output Summary (Last 24 hours) at 03/06/2019 1830 Last data filed at 03/06/2019 1454 Gross per 24 hour  Intake 1000 ml  Output 1000 ml  Net 0 ml    Labs/Imaging Results for orders placed or performed during the hospital encounter of 03/06/19 (from the past 48 hour(s))  Comprehensive metabolic panel     Status: Abnormal   Collection Time: 03/06/19  8:24 AM  Result Value Ref Range   Sodium 142 135 - 145 mmol/L   Potassium 3.6 3.5 - 5.1 mmol/L   Chloride 109 98 - 111 mmol/L   CO2 26 22 - 32 mmol/L   Glucose, Bld 127 (H) 70 - 99 mg/dL   BUN 11 8 - 23 mg/dL   Creatinine, Ser 9.601.06 0.61 - 1.24 mg/dL   Calcium 8.6 (L) 8.9 - 10.3 mg/dL   Total Protein 5.4 (L) 6.5 - 8.1 g/dL   Albumin 2.9 (L) 3.5 -  5.0 g/dL   AST 56 (H) 15 - 41 U/L   ALT 57 (H) 0 - 44 U/L   Alkaline Phosphatase 38 38 - 126 U/L   Total Bilirubin 0.7 0.3 - 1.2 mg/dL   GFR calc non Af Amer >60 >60 mL/min   GFR calc Af Amer >60 >60 mL/min   Anion gap 7 5 - 15    Comment: Performed at [facility], [address] [place], [place] [address]  CBC with Differential     Status: Abnormal   Collection Time: 03/06/19  8:24 AM  Result Value Ref Range   WBC 7.1 4.0 - 10.5 K/uL   RBC 2.30 (L) 4.22 - 5.81 MIL/uL   Hemoglobin 8.9 (L) 13.0 - 17.0 g/dL   HCT 81.123.9 (L) 91.439.0 - 78.252.0 %   MCV 103.9 (H) 80.0 - 100.0 fL   MCH 38.7 (H) 26.0 - 34.0 pg   MCHC 37.2 (H) 30.0 - 36.0 g/dL   RDW 95.613.2 21.311.5 - 08.615.5 %   Platelets 128 (L) 150 - 400 K/uL   nRBC 0.0 0.0 - 0.2 %   Neutrophils Relative % 74 %   Neutro Abs 5.2 1.7 - 7.7 K/uL   Lymphocytes Relative 18 %   Lymphs Abs 1.3 0.7 - 4.0 K/uL   Monocytes Relative 7 %   Monocytes Absolute 0.5 0.1 - 1.0 K/uL   Eosinophils Relative 1 %   Eosinophils Absolute 0.0 0.0 - 0.5 K/uL   Basophils Relative 0 %   Basophils Absolute 0.0 0.0 - 0.1 K/uL   Immature Granulocytes 0 %   Abs Immature Granulocytes 0.02 0.00 - 0.07 K/uL    Comment: Performed at [facility], [address] [place], [place] [address]  Ethanol     Status: None   Collection Time: 03/06/19  8:24 AM  Result Value Ref Range   Alcohol, Ethyl (B) <10 <10 mg/dL    Comment: (NOTE) Lowest detectable limit for serum alcohol is 10 mg/dL. For medical purposes only. Performed at [facility], [address] [place], [place] [address]   Type and screen     Status: None   Collection Time: 03/06/19 10:11 AM  Result Value Ref Range   ABO/RH(D) O NEG    Antibody Screen NEG    Sample Expiration      03/09/2019,2359 Performed at [facility], [address] [place], [place] [address]   ABO/Rh     Status: None   Collection Time: 03/06/19 10:12 AM  Result Value Ref Range   ABO/RH(D)      O NEG Performed at  [facility], [address] [place], [place]  [address]   Hepatic function panel     Status: Abnormal   Collection Time: 03/06/19  2:07 PM  Result Value Ref Range   Total Protein 5.6 (L) 6.5 - 8.1 g/dL   Albumin 2.8 (L) 3.5 - 5.0 g/dL   AST 43 (H) 15 - 41 U/L   ALT 52 (H) 0 - 44 U/L   Alkaline Phosphatase 42 38 - 126 U/L   Total Bilirubin 0.6 0.3 - 1.2 mg/dL   Bilirubin, Direct 0.1 0.0 - 0.2 mg/dL   Indirect Bilirubin 0.5 0.3 - 0.9 mg/dL    Comment: Performed at [facility], [place] [address] [place], [place] [address]  Magnesium     Status: None   Collection Time: 03/06/19  2:07 PM  Result Value Ref Range   Magnesium 2.1 1.7 - 2.4 mg/dL    Comment: Performed at [facility], [place] [address], [place], [place] [address]  Phosphorus     Status: None   Collection Time: 03/06/19  2:07 PM  Result Value Ref Range   Phosphorus 3.0 2.5 - 4.6 mg/dL    Comment: Performed at [place] [address] [place], [place] [address]  Protime-INR     Status: Abnormal   Collection Time: 03/06/19  2:07 PM  Result Value Ref Range   Prothrombin Time 15.5 (H) 11.4 - 15.2 seconds   INR 1.2 0.8 - 1.2    Comment: (NOTE) INR goal varies based on device and disease states. Performed at [facility], [place] [address], [place], [place] [address]   APTT     Status: None   Collection Time: 03/06/19  2:07 PM  Result Value Ref Range   aPTT 30 24 - 36 seconds    Comment: Performed at [place] [address], [place], [place] [address]  Vitamin B12     Status: None   Collection Time: 03/06/19  2:07 PM  Result Value Ref Range   Vitamin B-12 276 180 - 914 pg/mL    Comment: (NOTE) This assay is not validated for testing neonatal or myeloproliferative syndrome specimens for Vitamin B12 levels. Performed at [facility], [place] [address], [place], [place] [address]   Troponin I (High Sensitivity)     Status: Abnormal   Collection Time: 03/06/19  2:07 PM  Result  Value Ref Range   Troponin I (High Sensitivity) 227 (HH) <18 ng/L    Comment: CRITICAL RESULT CALLED TO, READ BACK BY AND VERIFIED WITH: Emelie Newsom, L RN @ 3664 ON 03/06/2019 BY TEMOCHE, H (NOTE) Elevated high sensitivity troponin I (hsTnI) values and significant  changes across serial measurements may suggest ACS but many other  chronic and acute conditions are known to elevate hsTnI results.  Refer to the Links section for chest pain algorithms and additional  guidance. Performed at [facility], [place] [address] [place],  [address]   TSH     Status: None   Collection Time: 03/06/19  2:07 PM  Result Value Ref Range   TSH 0.771 0.350 - 4.500 uIU/mL    Comment: Performed by a 3rd Generation assay with a functional sensitivity of <=0.01 uIU/mL. Performed at [facility], [place] [address], [place],  [address]    Dg Chest Port 1 View  Result Date: 03/06/2019 CLINICAL DATA:  Syncopal episode this morning.  SVT. EXAM: PORTABLE CHEST 1 VIEW COMPARISON:  12/18/2015; 12/15/2015 FINDINGS: Grossly unchanged borderline enlarged cardiac silhouette.  Normal mediastinal contours. No focal parenchymal opacities. No pleural effusion or pneumothorax. No evidence of edema. No acute osseous abnormalities. IMPRESSION: Borderline cardiomegaly without superimposed acute cardiopulmonary disease. Specifically, no definite evidence of pulmonary edema. Electronically Signed   By: Simonne ComeJohn  Watts M.D.   On: 03/06/2019 08:39    Pending Labs Unresulted Labs (From admission, onward)    Start     Ordered   03/07/19 0500  Comprehensive metabolic panel  Tomorrow morning,   R     03/06/19 1348   03/07/19 0500  CBC  Tomorrow morning,   R     03/06/19 1348   03/06/19 1900  Hemoglobin and hematocrit, blood  Once,   STAT     03/06/19 1352   03/06/19 1405  Hepatitis panel, acute  Once,   STAT     03/06/19 1404   03/06/19 1350  Folate RBC  Once,   STAT     03/06/19 1349   03/06/19 1345  HIV antibody  (Routine Testing)  Once,   STAT     03/06/19 1348   03/06/19 1257  SARS CORONAVIRUS 2 (TAT 6-24 HRS) Nasopharyngeal Nasopharyngeal Swab  (Asymptomatic/Tier 2 Patients Labs)  Once,   STAT    Question Answer Comment  Is this test for diagnosis or screening Screening   Symptomatic for COVID-19 as defined by CDC No   Hospitalized for COVID-19 No   Admitted to ICU for COVID-19 No   Previously tested for COVID-19 Yes   Resident in a congregate (group) care setting No   Employed in healthcare setting No      03/06/19 1256          Vitals/Pain Today's Vitals   03/06/19 1430 03/06/19 1500 03/06/19 1530 03/06/19 1600  BP: (!) 97/58 (!) 93/54 132/72 111/67  Pulse: 73 69 80 92  Resp: 17 12 (!) 23 20  Temp:      TempSrc:      SpO2: 100% 100% 99% 100%  PainSc:        Isolation Precautions No active isolations  Medications Medications  0.9 %  sodium chloride infusion ( Intravenous New Bag/Given 03/06/19 1614)  acetaminophen (TYLENOL) tablet 650 mg (has no administration in time range)    Or  acetaminophen (TYLENOL) suppository 650 mg (has no administration in time range)  polyethylene glycol (MIRALAX / GLYCOLAX) packet 17 g (has no administration in time range)  ondansetron (ZOFRAN) tablet 4 mg (has no administration in time range)    Or  ondansetron (ZOFRAN) injection 4 mg (has no administration in time range)  sodium chloride 0.9 % bolus 1,000 mL (0 mLs Intravenous Stopped 03/06/19 1100)    Mobility walks Low fall risk   Focused Assessments Cardiac Assessment Handoff:    Lab Results  Component Value Date   CKTOTAL 169 12/16/2015   TROPONINI <0.03 12/16/2015   No results found for: DDIMER Does the Patient currently have chest pain? No     R Recommendations: See Admitting Provider Note  Report given to:   Additional Notes:

## 2019-03-06 NOTE — Significant Event (Signed)
Rapid Response Event Note  Overview: Sustained SVT 190s  Initial Focused Assessment: Notified by [place] Desanctis of pt in SVT 180-190. Upon arrival, pt is alert, oriented x4, denies CP/SOB. Known hx of SVT. Asymptomatic. Pt placed on [place] prophylactically.  HR 194 SVT, 119/107 (112), RR 18 and sats 100% on 3L [place]. Dr. Georgette Shell notified. Lopressor 5mg  IV now ordered and administered. Adenosine on standby. Pt converted to SR after lopressor given. Dr. Georgette Shell at bedside.  HR 84 SR, 127/79 (94), RR 16 sats 100%  Interventions: -Lopressor 5mg  IV per order by nursing staff  Plan of Care (if not transferred): -Monitor for further arrhythmias -Notify primary svc and/or RRRN for further assistance if needed.  Event Summary: Called 2026 Arrived 2030 Call ended 2105  Madelynn Done

## 2019-03-06 NOTE — Significant Event (Addendum)
Overnight cardiology moonlighter note  I was paged by the nurse, Darlene at approximately 8:30 PM that the patient had arrived to the floor with SVT and a heart rate in the 190s.  Blood pressure was stable with a BP of 119/107.  Patient does have a history of known SVT and had had one episode earlier in the day.  Upon my arrival the patient reported feeling very well.  He had minimal symptoms and was stable hemodynamically despite his significantly elevated heart rate.  He was given Lopressor 5 mg IV.  This did not initially cause cessation of the SVT and plan was for adenosine 6 mg IV.  However, shortly thereafter patient's heart rate improved and sinus rhythm with a heart rate of 84 was visible on the monitor.  The patient notes that he has had episodes at home with palpitations and similar to how he felt during his SVT episode here.  He reports that these generally resolve spontaneously, quite quickly.  Will plan to initiate 12.5 mg p.o. metoprolol every 6 hours at this time given multiple episodes of SVT.  Bryna Colander, MD

## 2019-03-06 NOTE — Progress Notes (Signed)
Patient is in and out of SVT with rate in the 180-190's,. Patient is asymptomatic. No distress or complications noted. Rapid RN, MD at bedside. Patient is ventilating and oxygenation well w/o compromise.

## 2019-03-06 NOTE — H&P (Addendum)
History and Physical    Billy Boyd ZOX:[id number] DOB: 05/07/1956 DOA: 03/06/2019  PCP: Billy Boyd, No Pcp Per  Billy Boyd coming from: Home. I have personally briefly reviewed Billy Boyd's [facility]  Chief Complaint: Chest pain, weakness and diaphoresis started this morning.    HPI: Billy Boyd is a 63 y.o. male with medical history significant of schizophrenia brought by his sister to emergency department due to concern of sudden onset of chest pain, generalized weakness, lightheadedness and diaphoresis which started this morning.  His sister called EMS and brought Billy Boyd to the emergency department for further evaluation and management and on their arrival EMS found to have heart rate in 200s and concern for supraventricular tachycardia.  Billy Boyd received 2 boluses of adenosine and with second dose resulting in return to normal sinus rhythm.   Billy Boyd had TURP on 03/03/2019 -and has Foley catheter which is draining bloody urine since the procedure.  Billy Boyd was discharged home on 03/04/2019 on p.o. antibiotics. his hemoglobin dropped from 14.0-8.9 in 3 days.  Billy Boyd reports chronic constipation, numbness tingling sensation in bilateral fingertips.    Billy Boyd denies headache, blurry vision, shortness of breath, palpitation, leg swelling, decreased appetite, weight loss, use of over-the-counter NSAIDs or blood thinner.  He lives with his sister at home.  ED Course: Sitting comfortably on the bed, not in acute distress, vitals stable.  Hemoglobin dropped from 14.0-8.9 in 3 days, macrocytosis, thrombocytopenia, elevated LFTs.  Urology has been consulted for further evaluation and management.  review of Systems: As per HPI otherwise negative.    Past Medical History:  Diagnosis Date  . Benign localized prostatic hyperplasia with lower urinary tract symptoms (LUTS)   . History of alcohol abuse   . History of chest pain    ED visit 01-17-2016 dx unspecified chest  pain, GERD, hyponatremia  . History of supraventricular tachycardia    ED visit 12-15-2015;  per d/c note by dr allred (cardiology) to follow up as outpt for ETT and Echo  . History of syncope    ED visit 01-27-2017 w/ LOC-- situational (bowel movement) and vasovagal  . Schizophrenia, chronic condition [facility](HCC)     Past Surgical History:  Procedure Laterality Date  . TRANSURETHRAL RESECTION OF PROSTATE N/A 03/03/2019   Procedure: TRANSURETHRAL RESECTION OF THE PROSTATE (TURP);  Surgeon: Malen GauzeMcKenzie, Patrick L, MD;  [facility] [place];  Service: Urology;  Laterality: N/A;     reports that he has never smoked. He has never used smokeless tobacco. He reports that he does not drink alcohol or use drugs.  No Known Allergies  History reviewed. No pertinent family history.  Prior to Admission medications   Medication Sig Start Date End Date Taking? Authorizing Provider  BIOTIN FORTE PO Take 1 tablet by mouth daily at 12 noon.   Yes [provider]  Cyanocobalamin (B-12 PO) Take 1 tablet by mouth daily at 12 noon.   Yes [provider]  GINSENG PO Take 1 tablet by mouth daily at 12 noon.   Yes [provider]  oxyCODONE-acetaminophen (PERCOCET/ROXICET) 5-325 MG tablet Take 1 tablet by mouth every 4 (four) hours as needed for moderate pain. 03/04/19  Yes McKenzie, Mardene CelestePatrick L, MD  risperiDONE (RISPERDAL) 1 MG tablet Take 1 mg by mouth 2 (two) times daily.    Yes [provider]  VITAMIN D PO Take 1 tablet by mouth daily at 12 noon.   Yes [provider]  VITAMIN E PO Take 1 tablet  by mouth daily at 12 noon.   Yes [provider]  omeprazole (PRILOSEC) 20 MG capsule Take 1 capsule (20 mg total) by mouth daily. Billy Boyd not taking: Reported on 03/06/2019 01/17/16   Billy Belling, MD    Physical Exam: Vitals:   03/06/19 1130 03/06/19 1200 03/06/19 1230 03/06/19 1300  BP: 104/65 107/75 108/69 116/71  Pulse: 87 81 80 80  Resp: 17 16  16 10   Temp:      TempSrc:      SpO2: 99% 99% 99% 100%    Constitutional: NAD, calm, comfortable Vitals:   03/06/19 1130 03/06/19 1200 03/06/19 1230 03/06/19 1300  BP: 104/65 107/75 108/69 116/71  Pulse: 87 81 80 80  Resp: 17 16 16 10   Temp:      TempSrc:      SpO2: 99% 99% 99% 100%   Constitutional: Sitting comfortably, communicating well, alert and oriented, not in acute distress.   Eyes: PERRL, conjunctiva: Pale  ENMT: Mucous membranes are moist. Posterior pharynx clear of any exudate or lesions.Normal dentition.  Neck: normal, supple, no masses, no thyromegaly Respiratory: clear to auscultation bilaterally, no wheezing, no crackles. Normal respiratory effort. No accessory muscle use.  Cardiovascular: Regular rate and rhythm, no murmurs / rubs / gallops. No extremity edema. 2+ pedal pulses. No carotid bruits.  Abdomen: no tenderness, no masses palpated. No hepatosplenomegaly. Bowel sounds positive.  Urine bag: Gross hematuria noted on exam. Musculoskeletal: no clubbing / cyanosis. No joint deformity upper and lower extremities. Good ROM, no contractures. Normal muscle tone.  Skin: no rashes, lesions, ulcers. No induration Neurologic: CN 2-12 grossly intact. Sensation intact, DTR normal. Strength 5/5 in all 4.  Psychiatric: Normal judgment and insight. Alert and oriented x 3. Normal mood.    Labs on Admission: I have personally reviewed following labs and imaging studies  CBC: Recent Labs  Lab 03/03/19 1101 03/03/19 1616 03/03/19 2234 03/04/19 0428 03/06/19 0824  WBC 4.0 9.0 5.8 6.8 7.1  NEUTROABS  --   --   --   --  5.2  HGB 14.0 13.2 11.6* 10.1* 8.9*  HCT 43.1 41.3 35.2* 31.5* 23.9*  MCV 101.9* 104.3* 105.1* 103.6* 103.9*  PLT 159 156 151 133* 629*   Basic Metabolic Panel: Recent Labs  Lab 03/03/19 1101 03/03/19 1616 03/04/19 0428 03/06/19 0824  NA 142 142 139 142  K 4.5 3.9 4.8 3.6  CL 105 108 109 109  CO2 28 25 26 26   GLUCOSE 95 141* 111* 127*  BUN 20  17 14 11   CREATININE 1.08 0.96 0.92 1.06  CALCIUM 9.3 8.4* 8.4* 8.6*   GFR: Estimated Creatinine Clearance: 66.7 mL/min (by C-G formula based on SCr of 1.06 mg/dL). Liver Function Tests: Recent Labs  Lab 03/06/19 0824  AST 56*  ALT 57*  ALKPHOS 38  BILITOT 0.7  PROT 5.4*  ALBUMIN 2.9*   No results for input(s): LIPASE, AMYLASE in the last 168 hours. No results for input(s): AMMONIA in the last 168 hours. Coagulation Profile: No results for input(s): INR, PROTIME in the last 168 hours. Cardiac Enzymes: No results for input(s): CKTOTAL, CKMB, CKMBINDEX, TROPONINI in the last 168 hours. BNP (last 3 results) No results for input(s): PROBNP in the last 8760 hours. HbA1C: No results for input(s): HGBA1C in the last 72 hours. CBG: No results for input(s): GLUCAP in the last 168 hours. Lipid Profile: No results for input(s): CHOL, HDL, LDLCALC, TRIG, CHOLHDL, LDLDIRECT in the last 72 hours. Thyroid Function Tests: No results  for input(s): TSH, T4TOTAL, FREET4, T3FREE, THYROIDAB in the last 72 hours. Anemia Panel: No results for input(s): VITAMINB12, FOLATE, FERRITIN, TIBC, IRON, RETICCTPCT in the last 72 hours. Urine analysis:    Component Value Date/Time   COLORURINE YELLOW 01/27/2017 0627   APPEARANCEUR CLEAR 01/27/2017 0627   LABSPEC 1.012 01/27/2017 0627   PHURINE 6.0 01/27/2017 0627   GLUCOSEU NEGATIVE 01/27/2017 0627   HGBUR NEGATIVE 01/27/2017 0627   BILIRUBINUR NEGATIVE 01/27/2017 0627   KETONESUR NEGATIVE 01/27/2017 0627   PROTEINUR NEGATIVE 01/27/2017 0627   UROBILINOGEN 0.2 05/08/2010 2203   NITRITE NEGATIVE 01/27/2017 0627   LEUKOCYTESUR NEGATIVE 01/27/2017 0627    Radiological Exams on Admission: Dg Chest Port 1 View  Result Date: 03/06/2019 CLINICAL DATA:  Syncopal episode this morning.  SVT. EXAM: PORTABLE CHEST 1 VIEW COMPARISON:  12/18/2015; 12/15/2015 FINDINGS: Grossly unchanged borderline enlarged cardiac silhouette. Normal mediastinal contours. No  focal parenchymal opacities. No pleural effusion or pneumothorax. No evidence of edema. No acute osseous abnormalities. IMPRESSION: Borderline cardiomegaly without superimposed acute cardiopulmonary disease. Specifically, no definite evidence of pulmonary edema. Electronically Signed   By: Simonne Come M.D.   On: 03/06/2019 08:39    EKG: Normal sinus rhythm, normal axis, no ST-T wave changes noted.   Assessment/Plan Active Problems:   SVT (supraventricular tachycardia) (HCC)   Anemia associated with acute blood loss   S/P TURP   Schizophrenia (HCC)    Anemia associated with acute blood loss: -Status post TURP on 03/03/2019.  Billy Boyd has gross hematuria noted in urinary bag. -Hemoglobin dropped from 14-9.8 in 3 days. -Billy Boyd presented with chest pain, diaphoresis and generalized weakness.  SVT noted on cardiac monitor-adenosine x2 given-Billy Boyd is back to normal sinus rhythm. -Monitor H&H closely.  Avoid NSAIDs/anticoagulation. -Urology is consulted for further evaluation and management. -Billy Boyd currently asymptomatic-vitals stable, transfuse PRBC as needed.  SVT: Resolved -On cardiac telemetry.  Status post adenosine x2. -EKG: Normal sinus rhythm, no ST-T wave changes noted., troponin ordered. -Likely secondary to acute blood loss anemia. -Check TSH level.  BPH status post TURP: -On 03/03/2019. -Billy Boyd has Foley catheter-gross hematuria noted on exam. -Consulted urology for further management. -Denies fever, mild leukocytosis-no indication of antibiotics at this time.  Macrocytic anemia: -Low H&H with elevated MCV and thrombocytopenia. -Billy Boyd denies alcohol use, previous history of B12 deficiency. -Ethanol level: WNL. -We will check B12 level, folate level and TSH.  Elevated liver enzymes: -Unknown etiology.  Will check acute hepatitis panel -Repeat CMP tomorrow a.m.  Schizophrenia: Stable -We will continue home dose of risperidone.  Please Note: Billy Boyd's troponin  came back elevated at 227.  I called cardiology on call Dr. Antoine Poche and discussed the Billy Boyd.  He mentioned that Billy Boyd's elevated troponin is likely because of the demand ischemia and SVT episode.  Cardiology will evaluate the Billy Boyd for further management.    DVT prophylaxis: SCD/TED, no anticoagulation due to active bleeding.   Code Status: Full code  family Communication: Sister present at bedside.  Plan of care discussed with Billy Boyd in length and he verbalized understanding and agreed with it. Disposition Plan: To be determined Consults called: Urology Admission status: Observation.  Ollen Bowl MD [facility] Pager [phone]  If 7PM-7AM, please contact night-coverage [email or web address] Password [email or web address]  03/06/2019, 2:03 PM

## 2019-03-07 ENCOUNTER — Observation Stay (HOSPITAL_BASED_OUTPATIENT_CLINIC_OR_DEPARTMENT_OTHER): Payer: Medicare Other

## 2019-03-07 DIAGNOSIS — D62 Acute posthemorrhagic anemia: Principal | ICD-10-CM

## 2019-03-07 DIAGNOSIS — I361 Nonrheumatic tricuspid (valve) insufficiency: Secondary | ICD-10-CM

## 2019-03-07 DIAGNOSIS — Z79899 Other long term (current) drug therapy: Secondary | ICD-10-CM | POA: Diagnosis not present

## 2019-03-07 DIAGNOSIS — R7989 Other specified abnormal findings of blood chemistry: Secondary | ICD-10-CM | POA: Diagnosis present

## 2019-03-07 DIAGNOSIS — R823 Hemoglobinuria: Secondary | ICD-10-CM | POA: Diagnosis present

## 2019-03-07 DIAGNOSIS — R55 Syncope and collapse: Secondary | ICD-10-CM | POA: Diagnosis present

## 2019-03-07 DIAGNOSIS — Z20828 Contact with and (suspected) exposure to other viral communicable diseases: Secondary | ICD-10-CM | POA: Diagnosis present

## 2019-03-07 DIAGNOSIS — I10 Essential (primary) hypertension: Secondary | ICD-10-CM | POA: Diagnosis present

## 2019-03-07 DIAGNOSIS — F1010 Alcohol abuse, uncomplicated: Secondary | ICD-10-CM | POA: Diagnosis present

## 2019-03-07 DIAGNOSIS — F203 Undifferentiated schizophrenia: Secondary | ICD-10-CM | POA: Diagnosis not present

## 2019-03-07 DIAGNOSIS — Z8249 Family history of ischemic heart disease and other diseases of the circulatory system: Secondary | ICD-10-CM | POA: Diagnosis not present

## 2019-03-07 DIAGNOSIS — R61 Generalized hyperhidrosis: Secondary | ICD-10-CM | POA: Diagnosis present

## 2019-03-07 DIAGNOSIS — Z9079 Acquired absence of other genital organ(s): Secondary | ICD-10-CM

## 2019-03-07 DIAGNOSIS — R310 Gross hematuria: Secondary | ICD-10-CM | POA: Diagnosis present

## 2019-03-07 DIAGNOSIS — N400 Enlarged prostate without lower urinary tract symptoms: Secondary | ICD-10-CM | POA: Diagnosis present

## 2019-03-07 DIAGNOSIS — K5909 Other constipation: Secondary | ICD-10-CM | POA: Diagnosis present

## 2019-03-07 DIAGNOSIS — K219 Gastro-esophageal reflux disease without esophagitis: Secondary | ICD-10-CM | POA: Diagnosis present

## 2019-03-07 DIAGNOSIS — I340 Nonrheumatic mitral (valve) insufficiency: Secondary | ICD-10-CM | POA: Diagnosis not present

## 2019-03-07 DIAGNOSIS — I471 Supraventricular tachycardia: Secondary | ICD-10-CM | POA: Diagnosis not present

## 2019-03-07 DIAGNOSIS — R3915 Urgency of urination: Secondary | ICD-10-CM | POA: Diagnosis present

## 2019-03-07 DIAGNOSIS — E876 Hypokalemia: Secondary | ICD-10-CM | POA: Diagnosis present

## 2019-03-07 DIAGNOSIS — F209 Schizophrenia, unspecified: Secondary | ICD-10-CM | POA: Diagnosis present

## 2019-03-07 LAB — COMPREHENSIVE METABOLIC PANEL
ALT: 82 U/L — ABNORMAL HIGH (ref 0–44)
AST: 75 U/L — ABNORMAL HIGH (ref 15–41)
Albumin: 2.8 g/dL — ABNORMAL LOW (ref 3.5–5.0)
Alkaline Phosphatase: 49 U/L (ref 38–126)
Anion gap: 5 (ref 5–15)
BUN: 12 mg/dL (ref 8–23)
CO2: 28 mmol/L (ref 22–32)
Calcium: 8.6 mg/dL — ABNORMAL LOW (ref 8.9–10.3)
Chloride: 107 mmol/L (ref 98–111)
Creatinine, Ser: 1.28 mg/dL — ABNORMAL HIGH (ref 0.61–1.24)
GFR calc Af Amer: >60 mL/min (ref >60)
GFR calc non Af Amer: 59 mL/min — ABNORMAL LOW (ref >60)
Glucose, Bld: 124 mg/dL — ABNORMAL HIGH (ref 70–99)
Potassium: 4.1 mmol/L (ref 3.5–5.1)
Sodium: 140 mmol/L (ref 135–145)
Total Bilirubin: 0.8 mg/dL (ref 0.3–1.2)
Total Protein: 5.5 g/dL — ABNORMAL LOW (ref 6.5–8.1)

## 2019-03-07 LAB — CBC
HCT: 25.8 % — ABNORMAL LOW (ref 39.0–52.0)
Hemoglobin: 9.2 g/dL — ABNORMAL LOW (ref 13.0–17.0)
MCH: 36.4 pg — ABNORMAL HIGH (ref 26.0–34.0)
MCHC: 35.7 g/dL (ref 30.0–36.0)
MCV: 102 fL — ABNORMAL HIGH (ref 80.0–100.0)
Platelets: 137 10*3/uL — ABNORMAL LOW (ref 150–400)
RBC: 2.53 MIL/uL — ABNORMAL LOW (ref 4.22–5.81)
RDW: 13.2 % (ref 11.5–15.5)
WBC: 8.7 10*3/uL (ref 4.0–10.5)
nRBC: 0 % (ref 0.0–0.2)

## 2019-03-07 LAB — ECHOCARDIOGRAM COMPLETE: Weight: 2771.2 oz

## 2019-03-07 MED ORDER — BISACODYL 10 MG RE SUPP
10.0000 mg | Freq: Once | RECTAL | Status: AC
  Administered 2019-03-07: 10 mg via RECTAL
  Filled 2019-03-07: qty 1

## 2019-03-07 MED ORDER — VERAPAMIL HCL ER 120 MG PO TBCR
120.0000 mg | EXTENDED_RELEASE_TABLET | Freq: Every day | ORAL | Status: DC
  Filled 2019-03-07: qty 1

## 2019-03-07 MED ORDER — POLYETHYLENE GLYCOL 3350 17 G PO PACK
17.0000 g | PACK | Freq: Every day | ORAL | Status: DC
  Administered 2019-03-07 – 2019-03-08 (×2): 17 g via ORAL
  Filled 2019-03-07 (×2): qty 1

## 2019-03-07 MED ORDER — METOPROLOL TARTRATE 25 MG PO TABS
25.0000 mg | ORAL_TABLET | Freq: Two times a day (BID) | ORAL | Status: DC
  Administered 2019-03-07: 12.5 mg via ORAL
  Administered 2019-03-07 – 2019-03-08 (×2): 25 mg via ORAL
  Filled 2019-03-07 (×3): qty 1

## 2019-03-07 MED ORDER — RISPERIDONE 1 MG PO TABS
1.0000 mg | ORAL_TABLET | Freq: Two times a day (BID) | ORAL | Status: DC
  Administered 2019-03-07 – 2019-03-08 (×3): 1 mg via ORAL
  Filled 2019-03-07 (×4): qty 1

## 2019-03-07 MED ORDER — SENNOSIDES-DOCUSATE SODIUM 8.6-50 MG PO TABS
1.0000 | ORAL_TABLET | Freq: Two times a day (BID) | ORAL | Status: DC
  Administered 2019-03-07 – 2019-03-08 (×3): 1 via ORAL
  Filled 2019-03-07 (×3): qty 1

## 2019-03-07 NOTE — Progress Notes (Signed)
Patient went into an SVT 190's while assessing him and going over his plan of care, attempted the Valsalva maneuver but was unsuccessful, VSS and patient not in any distress at this time but place Zoll pads on and O2 at 4L N/C and called Rapid Response for backup, will continue to monitor.

## 2019-03-07 NOTE — Progress Notes (Signed)
  Echocardiogram 2D Echocardiogram has been performed.  Billy Boyd 03/07/2019, 11:29 AM

## 2019-03-07 NOTE — Progress Notes (Signed)
Subjective: [name] reports leakage around the catheter.  The foley was irrigated overnight but the leakage didn't improve and there is no urine in the foley tubing.  He has some urgency.   ROS:  Review of Systems  All other systems reviewed and are negative.   Anti-infectives: Anti-infectives (From admission, onward)   None      Current Facility-Administered Medications  Medication Dose Route Frequency Provider Last Rate Last Dose  . 0.9 %  sodium chloride infusion   Intravenous Continuous Pahwani, Rinka R, MD 75 mL/hr at 03/07/19 0233    . acetaminophen (TYLENOL) tablet 650 mg  650 mg Oral Q6H PRN Pahwani, Rinka R, MD   650 mg at 03/07/19 0232   Or  . acetaminophen (TYLENOL) suppository 650 mg  650 mg Rectal Q6H PRN Pahwani, Rinka R, MD      . bisacodyl (DULCOLAX) suppository 10 mg  10 mg Rectal Once Zannie CoveJoseph, Preetha, MD      . metoprolol tartrate (LOPRESSOR) tablet 25 mg  25 mg Oral BID Lance MussVaranasi, Jayadeep S, MD      . ondansetron [facility](ZOFRAN) tablet 4 mg  4 mg Oral Q6H PRN Pahwani, Rinka R, MD       Or  . ondansetron (ZOFRAN) injection 4 mg  4 mg Intravenous Q6H PRN Pahwani, Rinka R, MD      . polyethylene glycol (MIRALAX / GLYCOLAX) packet 17 g  17 g Oral Daily Zannie CoveJoseph, Preetha, MD      . risperiDONE (RISPERDAL) tablet 1 mg  1 mg Oral BID Pahwani, Rinka R, MD      . senna-docusate (Senokot-S) tablet 1 tablet  1 tablet Oral BID Zannie CoveJoseph, Preetha, MD         Objective: Vital signs in last 24 hours: Temp:  [99.1 F (37.3 C)-99.6 F (37.6 C)] 99.6 F (37.6 C) (09/06 1957) Pulse Rate:  [65-197] 65 (09/07 0505) Resp:  [10-23] 18 (09/06 1957) BP: (93-143)/(54-118) 112/76 (09/07 0934) SpO2:  [93 %-100 %] 99 % (09/07 0505) Weight:  [78.6 kg] 78.6 kg (09/07 0505)  Intake/Output from previous day: 09/06 0701 - 09/07 0700 In: 2545 [P.O.:540; I.V.:825; IV Piggyback:1000] Out: 1530 [Urine:1530] Intake/Output this shift: No intake/output data recorded.   Physical Exam Vitals  signs reviewed.  Constitutional:      Appearance: Normal appearance.  Abdominal:     General: Abdomen is flat.     Palpations: Abdomen is soft.     Tenderness: There is abdominal tenderness (in SP area with fullness. ).  Neurological:     Mental Status: He is alert.     Lab Results:  Recent Labs    03/06/19 0824 03/06/19 1949 03/07/19 0245  WBC 7.1  --  8.7  HGB 8.9* 9.0* 9.2*  HCT 23.9* 27.3* 25.8*  PLT 128*  --  137*   BMET Recent Labs    03/06/19 0824 03/07/19 0245  NA 142 140  K 3.6 4.1  CL 109 107  CO2 26 28  GLUCOSE 127* 124*  BUN 11 12  CREATININE 1.06 1.28*  CALCIUM 8.6* 8.6*   PT/INR Recent Labs    03/06/19 1407  LABPROT 15.5*  INR 1.2   ABG No results for input(s): PHART, HCO3 in the last 72 hours.  Invalid input(s): PCO2, PO2  Studies/Results: Dg Chest Port 1 View  Result Date: 03/06/2019 CLINICAL DATA:  Syncopal episode this morning.  SVT. EXAM: PORTABLE CHEST 1 VIEW COMPARISON:  12/18/2015; 12/15/2015 FINDINGS: Grossly unchanged borderline enlarged cardiac silhouette. Normal  mediastinal contours. No focal parenchymal opacities. No pleural effusion or pneumothorax. No evidence of edema. No acute osseous abnormalities. IMPRESSION: Borderline cardiomegaly without superimposed acute cardiopulmonary disease. Specifically, no definite evidence of pulmonary edema. Electronically Signed   By: Sandi Mariscal M.D.   On: 03/06/2019 08:39     Assessment and Plan: Gross hematuria.   The catheter is not draining and large amounts of urine are leaking around the catheter.  I will have the foley removed and if he is unable to void, the foley will be replaced with a 20-28fr foley.    ABL anemia.  hgb is up some.       LOS: 0 days    Irine Seal 03/07/2019 [phone] ID: Billy Boyd, male   DOB: 1955/08/02, 63 y.o.   MRN: [id number]

## 2019-03-07 NOTE — Progress Notes (Signed)
Received orders to push 5 MG IV Lopressor and Dr Verline Lema would be up to see patient. Adenosine was also removed and on hold til MD arrives. Patient still remains asymptomatic at this time, will continue to monitor.

## 2019-03-07 NOTE — Progress Notes (Signed)
Patient's foley draining thick bloody urine and he is becoming uncomfortable, text paged [facility] for orders to irrigate since Urology already addressed in their earlier note. Patient has a TURP on 9/3, will get an irrigation set-up and explain to patient and when the order is placed will irrigate.

## 2019-03-07 NOTE — Progress Notes (Signed)
Patient's foley irrigated with 120cc NSS using a 20cc syringe via irrigation port on catheter and was able to get the urine to become light pink but did get a couple of small clots along with thick sediment out, patient tolerated well, will continue to monitor.

## 2019-03-07 NOTE — Progress Notes (Signed)
Patient called out saying that he was becoming uncomfortable and felt that he needed to urinate. I went back in to irrigate again and after placing 60cc NSS and not being able to get anything back, the patient started to urinate around the catheter, attempted to irrigate again without any success but did have a small bloody clot in the tubing, placed call to [facility] and Miss Blount called back. She was made aware of the situation and determined that the patient was not in distress that would deem paged Urologist so will continue to monitor. Patient is resting comfortably at this time, will not attempt to pull the catheter at this time since he is a new TURP.

## 2019-03-07 NOTE — Progress Notes (Signed)
Patient finally broke into a NSR in the 80's and B/P remains WNL, patient still asymptomatic, will continue to monitor.

## 2019-03-07 NOTE — Consult Note (Addendum)
ELECTROPHYSIOLOGY CONSULT NOTE    Primary Care Physician: Patient, No Pcp Per Referring Physician:  Dr Irish Lack Admit Date: 03/06/2019  Reason for consultation:  SVT  Billy Boyd is a 63 y.o. male with a h/o recurrent SVT for whom EP is consulted.  He was previously seen by me in 2017 with his first episode of short RP SVT (my note personally reviewed).  He has done well without medical therapy since that time.  He is now admitted with 4 days of constipation and hematira s/p TURP 03/06/2019.  He also noticed some rapid heart beats.  On EMS arrival, he was found to have SVT for which he received adenosine x 2.  He converted to sinus rhythm. He has been observed to have short episode of SVT while here with V rates of up to 200 bpm.   Today, he denies symptoms of chest pain, shortness of breath, orthopnea, PND, lower extremity edema, dizziness, presyncope, syncope, or neurologic sequela.  His primary concern is currently ongoing constipation.  The patient is tolerating medications without difficulties and is otherwise without complaint today.   Past Medical History:  Diagnosis Date  . Benign localized prostatic hyperplasia with lower urinary tract symptoms (LUTS)   . History of alcohol abuse   . History of chest pain    ED visit 01-17-2016 dx unspecified chest pain, GERD, hyponatremia  . History of supraventricular tachycardia    ED visit 12-15-2015;  per d/c note by dr Catlin Aycock (cardiology) to follow up as outpt for ETT and Echo  . History of syncope    ED visit 01-27-2017 w/ LOC-- situational (bowel movement) and vasovagal  . Schizophrenia, chronic condition [facility])    Past Surgical History:  Procedure Laterality Date  . TRANSURETHRAL RESECTION OF PROSTATE N/A 03/03/2019   Procedure: TRANSURETHRAL RESECTION OF THE PROSTATE (TURP);  Surgeon: Cleon Gustin, MD;  Location: [facility];  Service: Urology;  Laterality: N/A;    . bisacodyl  10 mg Rectal Once  . metoprolol  tartrate  25 mg Oral BID  . polyethylene glycol  17 g Oral Daily  . risperiDONE  1 mg Oral BID  . senna-docusate  1 tablet Oral BID   . sodium chloride 75 mL/hr at 03/07/19 0233    No Known Allergies  Social History   Socioeconomic History  . Marital status: Married    Spouse name: Not on file  . Number of children: Not on file  . Years of education: Not on file  . Highest education level: Not on file  Occupational History  . Not on file  Social Needs  . Financial resource strain: Not on file  . Food insecurity    Worry: Not on file    Inability: Not on file  . Transportation needs    Medical: Not on file    Non-medical: Not on file  Tobacco Use  . Smoking status: Never Smoker  . Smokeless tobacco: Never Used  Substance and Sexual Activity  . Alcohol use: No  . Drug use: No  . Sexual activity: Not on file  Lifestyle  . Physical activity    Days per week: Not on file    Minutes per session: Not on file  . Stress: Not on file  Relationships  . Social Herbalist on phone: Not on file    Gets together: Not on file    Attends religious service: Not on file    Active member of club or organization:  Not on file    Attends meetings of clubs or organizations: Not on file    Relationship status: Not on file  . Intimate partner violence    Fear of current or ex partner: Not on file    Emotionally abused: Not on file    Physically abused: Not on file    Forced sexual activity: Not on file  Other Topics Concern  . Not on file  Social History Narrative   Lives with sister.      Family History  Problem Relation Age of Onset  . Heart disease Mother        Pacemaker    ROS- All systems are reviewed and negative except as per the HPI above  Physical Exam: Telemetry:  Sinus with episodes of SVT.  SVT appears to be short RP.  There is one episode with alternative wide/ narrow QRS complexes.  No sustained VT  Vitals:   03/07/19 0055 03/07/19 0423 03/07/19  0505 03/07/19 0934  BP: 124/76 118/81 111/70 112/76  Pulse: 78 74 65   Resp:      Temp:      TempSrc:      SpO2: 98%  99%   Weight:   78.6 kg     GEN- The patient is well appearing, alert and oriented x 3 today.   Head- normocephalic, atraumatic Eyes-  Sclera clear, conjunctiva pink Ears- hearing intact Oropharynx- clear Neck- supple  Lungs-   normal work of breathing Heart- Regular rate and rhythm  GI- soft, NT, ND, + BS Extremities- no clubbing, cyanosis, or edema MS- no significant deformity or atrophy Skin- no rash or lesion Psych- euthymic mood, full affect Neuro- strength and sensation are intact  EKG-  Sinus rhythm without pre-excitation  Labs:   Lab Results  Component Value Date   WBC 8.7 03/07/2019   HGB 9.2 (L) 03/07/2019   HCT 25.8 (L) 03/07/2019   MCV 102.0 (H) 03/07/2019   PLT 137 (L) 03/07/2019    Recent Labs  Lab 03/07/19 0245  NA 140  K 4.1  CL 107  CO2 28  BUN 12  CREATININE 1.28*  CALCIUM 8.6*  PROT 5.5*  BILITOT 0.8  ALKPHOS 49  ALT 82*  AST 75*  GLUCOSE 124*   Lab Results  Component Value Date   CKTOTAL 169 12/16/2015   TROPONINI <0.03 12/16/2015   No results found for: CHOL No results found for: HDL No results found for: LDLCALC No results found for: TRIG No results found for: CHOLHDL No results found for: LDLDIRECT     Echo:  pending  ASSESSMENT AND PLAN:   1. SVT The patient has symptomatic short RP SVT.  This is his second clinical episode, though I suspect that he has had multiple prior episodes.  Therapeutic strategies for supraventricular tachycardia including medicine and ablation were discussed in detail with the patient today. Given constipation, I would avoid calcium channel blockers currently.  He has started low dose beta blocker therapy though I doubt that this would be a good long term solution in a young male, already having urologic issues.  I think ultimately, catheter ablation would be his best option.  Risk, benefits, and alternatives to EP study and radiofrequency ablation were also discussed in detail today.  At this time, he is very clear that he is not interested in ablation.  He will continue to consider this option while here.  Hopefully, his new anemia and urologic issues will resolve.  We could consider  ablation later this admission if he is willing.  Very complicated patient with multiple acute medical issues ongoing.  I have spoken with Dr Eldridge DaceVaranasi at length and we have reviewed telemetry together.  A high level of decision making was required for this encounter.  EP to follow  Hillis RangeJames Keysi Oelkers, MD 03/07/2019  12:20 PM

## 2019-03-07 NOTE — Progress Notes (Signed)
Foley catheter removed per Dr. Jeffie Pollock  order. Blood clots noted at tip of foley catheter .

## 2019-03-07 NOTE — Progress Notes (Addendum)
Progress Note  Patient Name: Billy Boyd Date of Encounter: 03/07/2019  Primary Cardiologist: No primary care provider on file.   Subjective   More SVT last night.  Did feel symptoms which reminded him of what he had at home.   Inpatient Medications    Scheduled Meds: . bisacodyl  10 mg Rectal Once  . polyethylene glycol  17 g Oral Daily  . risperiDONE  1 mg Oral BID  . senna-docusate  1 tablet Oral BID  . verapamil  120 mg Oral Daily   Continuous Infusions: . sodium chloride 75 mL/hr at 03/07/19 0233   PRN Meds: acetaminophen **OR** acetaminophen, ondansetron **OR** ondansetron (ZOFRAN) IV   Vital Signs    Vitals:   03/07/19 0055 03/07/19 0423 03/07/19 0505 03/07/19 0934  BP: 124/76 118/81 111/70 112/76  Pulse: 78 74 65   Resp:      Temp:      TempSrc:      SpO2: 98%  99%   Weight:   78.6 kg     Intake/Output Summary (Last 24 hours) at 03/07/2019 0951 Last data filed at 03/07/2019 0600 Gross per 24 hour  Intake 2545 ml  Output 1530 ml  Net 1015 ml   Last 3 Weights 03/07/2019 03/03/2019 01/27/2017  Weight (lbs) 173 lb 3.2 oz 165 lb 9.6 oz 180 lb  Weight (kg) 78.563 kg 75.116 kg 81.647 kg      Telemetry    Currently NSR, paroxysmal tachy to the 200 bpm range- Personally Reviewed  ECG    NSR, nonspecific ST changes on 9/6- Personally Reviewed  Physical Exam   GEN: No acute distress.   Neck: No JVD Cardiac: RRR, no murmurs, rubs, or gallops.  Respiratory: Clear to auscultation bilaterally. GI: Soft, nontender, non-distended  MS: No edema; No deformity. Neuro:  Nonfocal  Psych: Normal affect   Labs    High Sensitivity Troponin:   Recent Labs  Lab 03/06/19 1407 03/06/19 1949  TROPONINIHS 227* 211*      Chemistry Recent Labs  Lab 03/04/19 0428 03/06/19 0824 03/06/19 1407 03/07/19 0245  NA 139 142  --  140  K 4.8 3.6  --  4.1  CL 109 109  --  107  CO2 26 26  --  28  GLUCOSE 111* 127*  --  124*  BUN 14 11  --  12  CREATININE 0.92 1.06   --  1.28*  CALCIUM 8.4* 8.6*  --  8.6*  PROT  --  5.4* 5.6* 5.5*  ALBUMIN  --  2.9* 2.8* 2.8*  AST  --  56* 43* 75*  ALT  --  57* 52* 82*  ALKPHOS  --  38 42 49  BILITOT  --  0.7 0.6 0.8  GFRNONAA >60 >60  --  59*  GFRAA >60 >60  --  >60  ANIONGAP 4* 7  --  5     Hematology Recent Labs  Lab 03/04/19 0428 03/06/19 0824 03/06/19 1949 03/07/19 0245  WBC 6.8 7.1  --  8.7  RBC 3.04* 2.30*  --  2.53*  HGB 10.1* 8.9* 9.0* 9.2*  HCT 31.5* 23.9* 27.3* 25.8*  MCV 103.6* 103.9*  --  102.0*  MCH 33.2 38.7*  --  36.4*  MCHC 32.1 37.2*  --  35.7  RDW 13.2 13.2  --  13.2  PLT 133* 128*  --  137*    BNPNo results for input(s): BNP, PROBNP in the last 168 hours.   DDimer No results for  input(s): DDIMER in the last 168 hours.   Radiology    Dg Chest Port 1 View  Result Date: 03/06/2019 CLINICAL DATA:  Syncopal episode this morning.  SVT. EXAM: PORTABLE CHEST 1 VIEW COMPARISON:  12/18/2015; 12/15/2015 FINDINGS: Grossly unchanged borderline enlarged cardiac silhouette. Normal mediastinal contours. No focal parenchymal opacities. No pleural effusion or pneumothorax. No evidence of edema. No acute osseous abnormalities. IMPRESSION: Borderline cardiomegaly without superimposed acute cardiopulmonary disease. Specifically, no definite evidence of pulmonary edema. Electronically Signed   By: Sandi Mariscal M.D.   On: 03/06/2019 08:39    Cardiac Studies   Anemic; tele reviewed  Patient Profile     63 y.o. male with SVT in the setting of anemia post TURP  Assessment & Plan    1) Cardiac: Considered Verapamil SR 120 mg daily since  He may tolerate this better than a beta blocker given how active he is in general. However, given his severe constipation, verapamil could make this worse.  Change metoprolol to 25 mg BID.  2) D/w Dr. Rayann Heman.  Will consider ablation since he has had recurrent events.  Patient states he has infrequent episodes so would prefer medical therapy at this time. May just need  EP f/u going forward.   3) echo ordered.     For questions or updates, please contact [place] Please consult [email or web address] for contact info under        Signed, Larae Grooms, MD  03/07/2019, 9:51 AM

## 2019-03-07 NOTE — Progress Notes (Signed)
[facility] PROGRESS NOTE  Juliane LackWillie Boyd ZOX:[id number] DOB: 1956/02/28 DOA: 03/06/2019 PCP: Patient, No Pcp Per  Assessment/Plan:  Anemia associated with acute blood loss: Status post TURP on 03/03/2019.  Patient has gross hematuria noted in urinary bag. Hemoglobin dropped from 14-9.8 in 3 days. Is steady a 9.2 this am. Continues with gross hematuria.  -Monitor H&H closely.  Avoid NSAIDs/anticoagulation. - appreciate Urology assistance. -Patient currently asymptomatic-vitals stable, transfuse PRBC as needed.  SVT: Resolved. Adenosine x2 given. Evaluated by cardiology who opine likely having multiple episodes and recommend ablation. Patient considering. Cards would prefer acute anemia and urological issues be resolved. Would consider this hospitalization if pt agreed. EKG: Normal sinus rhythm, no ST-T wave changes noted., troponin ordered. TSH level 1.8 -metoprolol 25mg  bid -monitor  BPH status post TURP: On 03/03/2019. Patient had Foley catheter-gross hematuria noted on exam. Evaluated by urology who recommend foley removal as was leaking. Will re-insert if unable to void.  No. fever, mild leukocytosis-no indication of antibiotics at this time. -gentle iv fluids -intake and output  Macrocytic anemia: Low H&H with elevated MCV and thrombocytopenia. Patient denies alcohol use, previous history of B12 deficiency. Ethanol level: WNL. B12 pending. Folate with in limits of normal.  -see #1  Elevated liver enzymes: Unknown etiology.   -hep panel pending -Repeat CMP tomorrow a.m.  Schizophrenia: Stable -We will continue home dose of risperidone.   Code Status: full Family Communication: patient Disposition Plan: home when ready   Consultants:  [facility]  [facility] cards  [facility]  Procedures:    Antibiotics:    HPI/Subjective: Lying in bed watching tv. Smiling stated "I feel much better"  Objective: Vitals:   03/07/19 0934 03/07/19 1235   BP: 112/76 125/81  Pulse:  80  Resp:    Temp:    SpO2:      Intake/Output Summary (Last 24 hours) at 03/07/2019 1256 Last data filed at 03/07/2019 1245 Gross per 24 hour  Intake 1545 ml  Output 2655 ml  Net -1110 ml   Filed Weights   03/07/19 0505  Weight: 78.6 kg    Exam:   General:  Awake alert no acute distress  Cardiovascular: rrr no mgr no LE edema  Respiratory: normal effort BS clear bilaterally  Abdomen: non-distended non-tender +BS no guarding or rebounding  Musculoskeletal: joints without swelling/erythema   Data Reviewed: Basic Metabolic Panel: Recent Labs  Lab 03/03/19 1101 03/03/19 1616 03/04/19 0428 03/06/19 0824 03/06/19 1407 03/07/19 0245  NA 142 142 139 142  --  140  K 4.5 3.9 4.8 3.6  --  4.1  CL 105 108 109 109  --  107  CO2 28 25 26 26   --  28  GLUCOSE 95 141* 111* 127*  --  124*  BUN 20 17 14 11   --  12  CREATININE 1.08 0.96 0.92 1.06  --  1.28*  CALCIUM 9.3 8.4* 8.4* 8.6*  --  8.6*  MG  --   --   --   --  2.1  --   PHOS  --   --   --   --  3.0  --    Liver Function Tests: Recent Labs  Lab 03/06/19 0824 03/06/19 1407 03/07/19 0245  AST 56* 43* 75*  ALT 57* 52* 82*  ALKPHOS 38 42 49  BILITOT 0.7 0.6 0.8  PROT 5.4* 5.6* 5.5*  ALBUMIN 2.9* 2.8* 2.8*   No results for input(s): LIPASE, AMYLASE in the last 168 hours. No results for  input(s): AMMONIA in the last 168 hours. CBC: Recent Labs  Lab 03/03/19 1616 03/03/19 2234 03/04/19 0428 03/06/19 0824 03/06/19 1949 03/07/19 0245  WBC 9.0 5.8 6.8 7.1  --  8.7  NEUTROABS  --   --   --  5.2  --   --   HGB 13.2 11.6* 10.1* 8.9* 9.0* 9.2*  HCT 41.3 35.2* 31.5* 23.9* 27.3* 25.8*  MCV 104.3* 105.1* 103.6* 103.9*  --  102.0*  PLT 156 151 133* 128*  --  137*   Cardiac Enzymes: No results for input(s): CKTOTAL, CKMB, CKMBINDEX, TROPONINI in the last 168 hours. BNP (last 3 results) No results for input(s): BNP in the last 8760 hours.  ProBNP (last 3 results) No results for  input(s): PROBNP in the last 8760 hours.  CBG: No results for input(s): GLUCAP in the last 168 hours.  Recent Results (from the past 240 hour(s))  SARS CORONAVIRUS 2 (TAT 6-24 HRS) Nasopharyngeal Nasopharyngeal Swab     Status: None   Collection Time: 02/28/19  8:09 AM   Specimen: Nasopharyngeal Swab  Result Value Ref Range Status   SARS Coronavirus 2 NEGATIVE NEGATIVE Final    Comment: (NOTE) SARS-CoV-2 target nucleic acids are NOT DETECTED. The SARS-CoV-2 RNA is generally detectable in upper and lower respiratory specimens during the acute phase of infection. Negative results do not preclude SARS-CoV-2 infection, do not rule out co-infections with other pathogens, and should not be used as the sole basis for treatment or other patient management decisions. Negative results must be combined with clinical observations, patient history, and epidemiological information. The expected result is Negative. Fact Sheet for Patients: HairSlick.no Fact Sheet for Healthcare Providers: quierodirigir.com This test is not yet approved or cleared by the [place] FDA and  has been authorized for detection and/or diagnosis of SARS-CoV-2 by FDA under an Emergency Use Authorization (EUA). This EUA will remain  in effect (meaning this test can be used) for the duration of the COVID-19 declaration under Section 56 4(b)(1) of the Act, 21 U.S.C. section 360bbb-3(b)(1), unless the authorization is terminated or revoked sooner. Performed at [facility], [address], [place], [place] [address]   SARS CORONAVIRUS 2 (TAT 6-24 HRS) Nasopharyngeal Nasopharyngeal Swab     Status: None   Collection Time: 03/06/19  2:23 PM   Specimen: Nasopharyngeal Swab  Result Value Ref Range Status   SARS Coronavirus 2 NEGATIVE NEGATIVE Final    Comment: (NOTE) SARS-CoV-2 target nucleic acids are NOT DETECTED. The SARS-CoV-2 RNA is generally detectable  in upper and lower respiratory specimens during the acute phase of infection. Negative results do not preclude SARS-CoV-2 infection, do not rule out co-infections with other pathogens, and should not be used as the sole basis for treatment or other patient management decisions. Negative results must be combined with clinical observations, patient history, and epidemiological information. The expected result is Negative. Fact Sheet for Patients: HairSlick.no Fact Sheet for Healthcare Providers: quierodirigir.com This test is not yet approved or cleared by the [place] FDA and  has been authorized for detection and/or diagnosis of SARS-CoV-2 by FDA under an Emergency Use Authorization (EUA). This EUA will remain  in effect (meaning this test can be used) for the duration of the COVID-19 declaration under Section 56 4(b)(1) of the Act, 21 U.S.C. section 360bbb-3(b)(1), unless the authorization is terminated or revoked sooner. Performed at [facility], [address] [place], [place] [address]      Studies: Dg Chest [place]  1 View  Result Date: 03/06/2019 CLINICAL DATA:  Syncopal episode this morning.  SVT. EXAM: PORTABLE CHEST 1 VIEW COMPARISON:  12/18/2015; 12/15/2015 FINDINGS: Grossly unchanged borderline enlarged cardiac silhouette. Normal mediastinal contours. No focal parenchymal opacities. No pleural effusion or pneumothorax. No evidence of edema. No acute osseous abnormalities. IMPRESSION: Borderline cardiomegaly without superimposed acute cardiopulmonary disease. Specifically, no definite evidence of pulmonary edema. Electronically Signed   By: Sandi Mariscal M.D.   On: 03/06/2019 08:39    Scheduled Meds: . metoprolol tartrate  25 mg Oral BID  . polyethylene glycol  17 g Oral Daily  . risperiDONE  1 mg Oral BID  . senna-docusate  1 tablet Oral BID   Continuous Infusions: . sodium chloride 75 mL/hr at 03/07/19 0258     Active Problems:   SVT (supraventricular tachycardia) (HCC)   Anemia associated with acute blood loss   S/P TURP   Schizophrenia ([place])    Time spent: 8 minutes    [place] NP  [facility]  If 7PM-7AM, please contact night-coverage at [email or web address], password [facility] 03/07/2019, 12:56 PM  LOS: 0 days

## 2019-03-08 ENCOUNTER — Encounter (HOSPITAL_BASED_OUTPATIENT_CLINIC_OR_DEPARTMENT_OTHER): Payer: Self-pay | Admitting: Urology

## 2019-03-08 LAB — CBC
HCT: 24.1 % — ABNORMAL LOW (ref 39.0–52.0)
Hemoglobin: 8.7 g/dL — ABNORMAL LOW (ref 13.0–17.0)
MCH: 36.6 pg — ABNORMAL HIGH (ref 26.0–34.0)
MCHC: 36.1 g/dL — ABNORMAL HIGH (ref 30.0–36.0)
MCV: 101.3 fL — ABNORMAL HIGH (ref 80.0–100.0)
Platelets: 162 10*3/uL (ref 150–400)
RBC: 2.38 MIL/uL — ABNORMAL LOW (ref 4.22–5.81)
RDW: 13.1 % (ref 11.5–15.5)
WBC: 7.2 10*3/uL (ref 4.0–10.5)
nRBC: 0 % (ref 0.0–0.2)

## 2019-03-08 LAB — COMPREHENSIVE METABOLIC PANEL
ALT: 96 U/L — ABNORMAL HIGH (ref 0–44)
AST: 79 U/L — ABNORMAL HIGH (ref 15–41)
Albumin: 2.6 g/dL — ABNORMAL LOW (ref 3.5–5.0)
Alkaline Phosphatase: 49 U/L (ref 38–126)
Anion gap: 6 (ref 5–15)
BUN: 10 mg/dL (ref 8–23)
CO2: 27 mmol/L (ref 22–32)
Calcium: 8.4 mg/dL — ABNORMAL LOW (ref 8.9–10.3)
Chloride: 106 mmol/L (ref 98–111)
Creatinine, Ser: 1 mg/dL (ref 0.61–1.24)
GFR calc Af Amer: >60 mL/min (ref >60)
GFR calc non Af Amer: >60 mL/min (ref >60)
Glucose, Bld: 110 mg/dL — ABNORMAL HIGH (ref 70–99)
Potassium: 3.9 mmol/L (ref 3.5–5.1)
Sodium: 139 mmol/L (ref 135–145)
Total Bilirubin: 1 mg/dL (ref 0.3–1.2)
Total Protein: 5.4 g/dL — ABNORMAL LOW (ref 6.5–8.1)

## 2019-03-08 LAB — GLUCOSE, CAPILLARY
Glucose-Capillary: 93 mg/dL (ref 70–99)
Glucose-Capillary: 164 mg/dL — ABNORMAL HIGH (ref 70–99)

## 2019-03-08 LAB — HEPATITIS PANEL, ACUTE
HCV Ab: 0.1 s/co ratio (ref 0.0–0.9)
Hep A IgM: NEGATIVE
Hep B C IgM: NEGATIVE
Hepatitis B Surface Ag: NEGATIVE

## 2019-03-08 LAB — FOLATE RBC
Folate, Hemolysate: 255 ng/mL
Folate, RBC: 992 ng/mL (ref >498)
Hematocrit: 25.7 % — ABNORMAL LOW (ref 37.5–51.0)

## 2019-03-08 LAB — HIV ANTIBODY (ROUTINE TESTING W REFLEX): HIV Screen 4th Generation wRfx: NONREACTIVE

## 2019-03-08 MED ORDER — METOPROLOL TARTRATE 25 MG PO TABS: 25.0000 mg | ORAL_TABLET | Freq: Two times a day (BID) | ORAL | 0 refills | Status: DC

## 2019-03-08 NOTE — Discharge Instructions (Signed)
Supraventricular Tachycardia, Adult Supraventricular tachycardia (SVT) is a kind of abnormal heartbeat. It makes your heart beat very fast and then beat at a normal speed. A normal resting heartbeat is 60-100 times a minute. This condition can make your heart beat more than 150 times a minute. Times of having a fast heartbeat (episodes) can be scary, but they are usually not dangerous. In some cases, they may lead to heart failure if:  They happen many times per day.  Last longer than a few seconds. What are the causes?  A normal heartbeat starts when an area called the sinoatrial node sends out an electrical signal. In SVT, other areas of the heart send out signals that get in the way of the signal from the sinoatrial node. What increases the risk? You are more likely to develop this condition if you are:  12-30 years old.  A woman. The following factors may make you more likely to develop this condition:  Stress.  Tiredness.  Smoking.  Stimulant drugs, such as cocaine and methamphetamine.  Alcohol.  Caffeine.  Pregnancy.  Feeling worried or nervous (anxiety). What are the signs or symptoms?  A pounding heart.  A feeling that your heart is skipping beats (palpitations).  Weakness.  Trouble getting enough air.  Pain or tightness in your chest.  Feeling like you are going to pass out (faint).  Feeling worried or nervous.  Dizziness.  Sweating.  Feeling sick to your stomach (nausea).  Passing out.  Tiredness. Sometimes, there are no symptoms. How is this treated?  Vagal nerve stimulation. Ways to do this include: ? Holding your breath and pushing, as though you are pooping (having a bowel movement). ? Massaging an area on one side of your neck. Do not try this yourself. Only a doctor should do this. If done the wrong way, it can lead to a stroke. ? Bending forward with your head between your legs. ? Coughing while bending forward with your head between  your legs. ? Closing your eyes and massaging your eyeballs. Ask a doctor how to do this.  Medicines that prevent attacks.  Medicine to stop an attack given through an IV tube at the hospital.  A small electric shock (cardioversion) that stops an attack.  Radiofrequency ablation. In this procedure, a small, thin tube (catheter) is used to send energy to the area that is causing the rapid heartbeats. If you do not have symptoms, you may not need treatment. Follow these instructions at home: Stress  Avoid things that make you feel stressed.  To deal with stress, try: ? Doing yoga or meditation, or being out in nature. ? Listening to relaxing music. ? Doing deep breathing. ? Taking steps to be healthy, such as getting lots of sleep, exercising, and eating a balanced diet. ? Talking with a mental health doctor. Lifestyle   Try to get at least 7 hours of sleep each night.  Do not use any products that contain nicotine or tobacco, such as cigarettes, e-cigarettes, and chewing tobacco. If you need help quitting, ask your doctor.  Be aware of how alcohol affects you. ? If alcohol gives you a fast heartbeat, do not drink alcohol. ? If alcohol does not seem to give you a fast heartbeat, limit alcohol use to no more than 1 drink a day for women who are not pregnant, and 2 drinks a day for men. In the [place] one drink is one of these: ? 12 oz of beer (355 mL). ? 5   oz of wine (148 mL). ? 1 oz of hard liquor (44 mL).  Be aware of how caffeine affects you. ? If caffeine gives you a fast heartbeat, do not eat, drink, or use anything with caffeine in it. ? If caffeine does not seem to give you a fast heartbeat, limit how much caffeine you eat, drink, or use.  Do not use stimulant drugs. If you need help quitting, ask your doctor. General instructions  Stay at a healthy weight.  Exercise regularly. Ask your doctor about good activities for you. Try one or a mixture of these: ? 150 minutes  a week of gentle exercise, like walking or yoga. ? 75 minutes a week of exercise that is very active, like running or swimming.  Do vagus nerve treatments to slow down your heartbeat as told by your doctor.  Take over-the-counter and prescription medicines only as told by your doctor.  Keep all follow-up visits as told by your doctor. This is important. Contact a doctor if:  You have a fast heartbeat more often.  Times of having a fast heartbeat last longer than before.  Home treatments to slow down your heartbeat do not help.  You have new symptoms. Get help right away if:  You have chest pain.  Your symptoms get worse.  You have trouble breathing.  Your heart beats very fast for more than 20 minutes.  You pass out. These symptoms may be an emergency. Do not wait to see if the symptoms will go away. Get medical help right away. Call your local emergency services (911 in the [place] Do not drive yourself to the hospital. Summary  SVT is a type of abnormal heart beat.  This condition can make your heart beat more than 150 times a minute.  Treatment depends on how often the condition happens and your symptoms. This information is not intended to replace advice given to you by your health care provider. Make sure you discuss any questions you have with your health care provider. Document Released: 06/16/2005 Document Revised: 05/04/2018 Document Reviewed: 05/04/2018 Elsevier Patient Education  2020 Elsevier Inc.  

## 2019-03-08 NOTE — Progress Notes (Addendum)
Electrophysiology Rounding Note  Patient Name: Billy Boyd Date of Encounter: 03/08/2019  Primary Cardiologist: New Electrophysiologist: Dr. Rayann Heman   Subjective   The patient is doing well today.  At this time, the patient denies chest pain, shortness of breath, or any new concerns.  Inpatient Medications    Scheduled Meds: . metoprolol tartrate  25 mg Oral BID  . polyethylene glycol  17 g Oral Daily  . risperiDONE  1 mg Oral BID  . senna-docusate  1 tablet Oral BID   Continuous Infusions: . sodium chloride 75 mL/hr at 03/08/19 0500   PRN Meds: acetaminophen **OR** acetaminophen, ondansetron **OR** ondansetron (ZOFRAN) IV   Vital Signs    Vitals:   03/07/19 1553 03/07/19 2036 03/07/19 2208 03/08/19 0549  BP: 125/83 111/73 111/73 107/68  Pulse: 74 77 80 64  Resp: 18 18  18   Temp: 98.5 F (36.9 C) 99 F (37.2 C)  98.6 F (37 C)  TempSrc: Oral Oral  Oral  SpO2: 98% 100%  100%  Weight:        Intake/Output Summary (Last 24 hours) at 03/08/2019 0726 Last data filed at 03/08/2019 0600 Gross per 24 hour  Intake 2132.33 ml  Output 2050 ml  Net 82.33 ml   Filed Weights   03/07/19 0505  Weight: 78.6 kg    Physical Exam    GEN- The patient is well appearing, alert and oriented x 3 today.   Head- normocephalic, atraumatic Eyes-  Sclera clear, conjunctiva pink Ears- hearing intact Oropharynx- clear Neck- supple Lungs- Clear to ausculation bilaterally, normal work of breathing Heart- Regular rate and rhythm, no murmurs, rubs or gallops GI- soft, NT, ND, + BS Extremities- no clubbing, cyanosis, or edema Skin- no rash or lesion Psych- euthymic mood, full affect Neuro- strength and sensation are intact  Labs    CBC Recent Labs    03/06/19 0824  03/07/19 0245 03/08/19 0431  WBC 7.1  --  8.7 7.2  NEUTROABS 5.2  --   --   --   HGB 8.9*   < > 9.2* 8.7*  HCT 23.9*   < > 25.8* 24.1*  MCV 103.9*  --  102.0* 101.3*  PLT 128*  --  137* 162   < > = values in  this interval not displayed.   Basic Metabolic Panel Recent Labs    03/06/19 1407 03/07/19 0245 03/08/19 0431  NA  --  140 139  K  --  4.1 3.9  CL  --  107 106  CO2  --  28 27  GLUCOSE  --  124* 110*  BUN  --  12 10  CREATININE  --  1.28* 1.00  CALCIUM  --  8.6* 8.4*  MG 2.1  --   --   PHOS 3.0  --   --    Liver Function Tests Recent Labs    03/07/19 0245 03/08/19 0431  AST 75* 79*  ALT 82* 96*  ALKPHOS 49 49  BILITOT 0.8 1.0  PROT 5.5* 5.4*  ALBUMIN 2.8* 2.6*   No results for input(s): LIPASE, AMYLASE in the last 72 hours. Cardiac Enzymes No results for input(s): CKTOTAL, CKMB, CKMBINDEX, TROPONINI in the last 72 hours. BNP Invalid input(s): POCBNP D-Dimer No results for input(s): DDIMER in the last 72 hours. Hemoglobin A1C No results for input(s): HGBA1C in the last 72 hours. Fasting Lipid Panel No results for input(s): CHOL, HDL, LDLCALC, TRIG, CHOLHDL, LDLDIRECT in the last 72 hours. Thyroid Function Tests Recent  Labs    03/06/19 1407  TSH 0.771    Telemetry    NSR 60-70s (personally reviewed)  Radiology    Dg Chest Port 1 View  Result Date: 03/06/2019 CLINICAL DATA:  Syncopal episode this morning.  SVT. EXAM: PORTABLE CHEST 1 VIEW COMPARISON:  12/18/2015; 12/15/2015 FINDINGS: Grossly unchanged borderline enlarged cardiac silhouette. Normal mediastinal contours. No focal parenchymal opacities. No pleural effusion or pneumothorax. No evidence of edema. No acute osseous abnormalities. IMPRESSION: Borderline cardiomegaly without superimposed acute cardiopulmonary disease. Specifically, no definite evidence of pulmonary edema. Electronically Signed   By: Simonne ComeJohn  Watts M.D.   On: 03/06/2019 08:39     Patient Profile     Billy Boyd is a 63 y.o. male with a h/o recurrent SVT for whom EP is consulted.  He was previously seen by Dr. Johney FrameAllred in 2017 with his first episode of short RP SVT.  He has done well without medical therapy since that time.  Assessment  & Plan    1. SVT Noted symptomatic short RP SVT, this being his second clinic episodes.  Avoid CCB for now with constipation.  Continue lopressor 25 mg BID Pt is clear is not interested in pursuing ablation at this time.  He will continue to consider while he is here.   EP follow up made for 10/2 at 1005 am to discuss further consideration. Placed in AVS  Pt has refused ablation and stable thus far on BB.  Will follow up in 3-4 weeks in office as above. Electrophysiology team to see as needed while here. Please call with questions.   For questions or updates, please contact [facility] Please consult [email or web address] for contact info under Cardiology/STEMI.  Dustin FlockSigned, Michael Andrew Tillery, PA-C  Pager: [phone]  03/08/2019, 7:26 AM     I have seen, examined the patient, and reviewed the above assessment and plan.  Changes to above are made where necessary.  On exam, RRR.  The patient appears to be maintaining sinus with metoprolol.  He is very clear that he is not interested in ablation at this time.  No further inpatient EP workup planned  Electrophysiology team to see as needed while here. Please call with questions.   Co Sign: Hillis RangeJames Matthan Sledge, MD 03/08/2019 1:06 PM

## 2019-03-08 NOTE — Discharge Summary (Signed)
Physician Discharge Summary  Billy Boyd JSE:[id number] DOB: [birth year] DOA: 03/06/2019  PCP: Wenda Low, MD  Admit date: 03/06/2019 Discharge date: 03/08/2019  Admitted From: home Disposition:  home  Recommendations for Outpatient Follow-up:  1. Follow up with PCP in 1-2 weeks 2. Please obtain BMP/CBC in one week 3. Follow up with urology scheduled for 9/10 4. Follow up with cardiology scheduled in October. He will need to be evaluated for stress test  Discharge Condition:stable CODE STATUS:full code Diet recommendation: heart healthy  Brief/Interim Summary: 63 y/o male who recently had TURP on 9/3, was admitted to the hospital on 9/6 with complaints of hematuria with clots. He had a decline in his hemoglobin from 14 to 8.9. He was also short of breath, diaphoretic and was feeling lightheaded. He was noted to be in SVT.  Patient was seen by cardiology and received adenose with broke the SVT. He was subsequently started on metoprolol for suppression. He had a mild elevation of troponin which was felt to be demand ischemia in the setting of SVT. It was felt that he may benefit from outpatient exercise tolerance test. He was also seen by EP for SVT, but was not interested in having an ablation procedure. His heart rate has remained stable on metoprolol  He was also seen by urology. Once admitted, his urine began to clear and foley catheter was removed. His urine is now normal in color. After initial drop in hemoglobin, it has remained stable in the 8.5-9 range. This can be followed as an outpatient.  Discharge Diagnoses:  Active Problems:   SVT (supraventricular tachycardia) (HCC)   Anemia associated with acute blood loss   S/P TURP   Schizophrenia [facility])    Discharge Instructions  Discharge Instructions    Diet - low sodium heart healthy   Complete by: As directed    Increase activity slowly   Complete by: As directed      Allergies as of 03/08/2019   No Known Allergies      Medication List    TAKE these medications   B-12 PO Take 1 tablet by mouth daily at 12 noon.   BIOTIN FORTE PO Take 1 tablet by mouth daily at 12 noon.   GINSENG PO Take 1 tablet by mouth daily at 12 noon.   metoprolol tartrate 25 MG tablet Commonly known as: LOPRESSOR Take 1 tablet (25 mg total) by mouth 2 (two) times daily.   omeprazole 20 MG capsule Commonly known as: PRILOSEC Take 1 capsule (20 mg total) by mouth daily.   oxyCODONE-acetaminophen 5-325 MG tablet Commonly known as: PERCOCET/ROXICET Take 1 tablet by mouth every 4 (four) hours as needed for moderate pain.   risperiDONE 1 MG tablet Commonly known as: RISPERDAL Take 1 mg by mouth 2 (two) times daily.   VITAMIN D PO Take 1 tablet by mouth daily at 12 noon.   VITAMIN E PO Take 1 tablet by mouth daily at 12 noon.      Follow-up Information    Shirley Friar, PA-C Follow up on 04/01/2019.   Specialty: Physician Assistant Why: at 1005 am for post hospital follow up.  Contact information: [address] [place] [address] [phone]        Karen Kays, NP On 03/10/2019.   Specialty: Nurse Practitioner Why: 8:15 Contact information: [address] [place] [address] [phone]        Wenda Low, MD Follow up in 1 week(s).  Specialty: Internal Medicine Why: repeat CBC on follow up Contact information: [address] [place] [address] [phone]          No Known Allergies  Consultations:  Urology  EP  cardiology   Procedures/Studies: Dg Chest Port 1 View  Result Date: 03/06/2019 CLINICAL DATA:  Syncopal episode this morning.  SVT. EXAM: PORTABLE CHEST 1 VIEW COMPARISON:  12/18/2015; 12/15/2015 FINDINGS: Grossly unchanged borderline enlarged cardiac silhouette. Normal mediastinal contours. No focal parenchymal opacities. No pleural effusion or pneumothorax. No evidence of edema. No acute osseous  abnormalities. IMPRESSION: Borderline cardiomegaly without superimposed acute cardiopulmonary disease. Specifically, no definite evidence of pulmonary edema. Electronically Signed   By: Simonne ComeJohn  Watts M.D.   On: 03/06/2019 08:39       Subjective: Urine is now clear without blood, no palpitations, chest pain or shortness of breath  Discharge Exam: Vitals:   03/08/19 0549 03/08/19 0930 03/08/19 0950 03/08/19 0952  BP: 107/68  112/74 112/74  Pulse: 64  75 70  Resp: 18   18  Temp: 98.6 F (37 C)     TempSrc: Oral     SpO2: 100%     Weight: 75.7 kg     Height:  5\' 7"  (1.702 m)      General: Pt is alert, awake, not in acute distress Cardiovascular: RRR, S1/S2 +, no rubs, no gallops Respiratory: CTA bilaterally, no wheezing, no rhonchi Abdominal: Soft, NT, ND, bowel sounds + Extremities: no edema, no cyanosis    The results of significant diagnostics from this hospitalization (including imaging, microbiology, ancillary and laboratory) are listed below for reference.     Microbiology: Recent Results (from the past 240 hour(s))  SARS CORONAVIRUS 2 (TAT 6-24 HRS) Nasopharyngeal Nasopharyngeal Swab     Status: None   Collection Time: 02/28/19  8:09 AM   Specimen: Nasopharyngeal Swab  Result Value Ref Range Status   SARS Coronavirus 2 NEGATIVE NEGATIVE Final    Comment: (NOTE) SARS-CoV-2 target nucleic acids are NOT DETECTED. The SARS-CoV-2 RNA is generally detectable in upper and lower respiratory specimens during the acute phase of infection. Negative results do not preclude SARS-CoV-2 infection, do not rule out co-infections with other pathogens, and should not be used as the sole basis for treatment or other patient management decisions. Negative results must be combined with clinical observations, patient history, and epidemiological information. The expected result is Negative. Fact Sheet for Patients: HairSlick.nohttps://www.fda.gov/media/138098/download Fact Sheet for Healthcare  Providers: quierodirigir.comhttps://www.fda.gov/media/138095/download This test is not yet approved or cleared by the [place] FDA and  has been authorized for detection and/or diagnosis of SARS-CoV-2 by FDA under an Emergency Use Authorization (EUA). This EUA will remain  in effect (meaning this test can be used) for the duration of the COVID-19 declaration under Section 56 4(b)(1) of the Act, 21 U.S.C. section 360bbb-3(b)(1), unless the authorization is terminated or revoked sooner. Performed at [facility], [address] [place], [place] [address]   SARS CORONAVIRUS 2 (TAT 6-24 HRS) Nasopharyngeal Nasopharyngeal Swab     Status: None   Collection Time: 03/06/19  2:23 PM   Specimen: Nasopharyngeal Swab  Result Value Ref Range Status   SARS Coronavirus 2 NEGATIVE NEGATIVE Final    Comment: (NOTE) SARS-CoV-2 target nucleic acids are NOT DETECTED. The SARS-CoV-2 RNA is generally detectable in upper and lower respiratory specimens during the acute phase of infection. Negative results do not preclude SARS-CoV-2 infection, do not rule out co-infections with other pathogens, and should  not be used as the sole basis for treatment or other patient management decisions. Negative results must be combined with clinical observations, patient history, and epidemiological information. The expected result is Negative. Fact Sheet for Patients: HairSlick.no Fact Sheet for Healthcare Providers: quierodirigir.com This test is not yet approved or cleared by the [place] FDA and  has been authorized for detection and/or diagnosis of SARS-CoV-2 by FDA under an Emergency Use Authorization (EUA). This EUA will remain  in effect (meaning this test can be used) for the duration of the COVID-19 declaration under Section 56 4(b)(1) of the Act, 21 U.S.C. section 360bbb-3(b)(1), unless the authorization is terminated or revoked sooner. Performed at  [facility], [address], [place], [place] [address]      Labs: BNP (last 3 results) No results for input(s): BNP in the last 8760 hours. Basic Metabolic Panel: Recent Labs  Lab 03/03/19 1616 03/04/19 0428 03/06/19 0824 03/06/19 1407 03/07/19 0245 03/08/19 0431  NA 142 139 142  --  140 139  K 3.9 4.8 3.6  --  4.1 3.9  CL 108 109 109  --  107 106  CO2 25 26 26   --  28 27  GLUCOSE 141* 111* 127*  --  124* 110*  BUN 17 14 11   --  12 10  CREATININE 0.96 0.92 1.06  --  1.28* 1.00  CALCIUM 8.4* 8.4* 8.6*  --  8.6* 8.4*  MG  --   --   --  2.1  --   --   PHOS  --   --   --  3.0  --   --    Liver Function Tests: Recent Labs  Lab 03/06/19 0824 03/06/19 1407 03/07/19 0245 03/08/19 0431  AST 56* 43* 75* 79*  ALT 57* 52* 82* 96*  ALKPHOS 38 42 49 49  BILITOT 0.7 0.6 0.8 1.0  PROT 5.4* 5.6* 5.5* 5.4*  ALBUMIN 2.9* 2.8* 2.8* 2.6*   No results for input(s): LIPASE, AMYLASE in the last 168 hours. No results for input(s): AMMONIA in the last 168 hours. CBC: Recent Labs  Lab 03/03/19 2234 03/04/19 0428 03/06/19 0824 03/06/19 1949 03/07/19 0245 03/08/19 0431  WBC 5.8 6.8 7.1  --  8.7 7.2  NEUTROABS  --   --  5.2  --   --   --   HGB 11.6* 10.1* 8.9* 9.0* 9.2* 8.7*  HCT 35.2* 31.5* 23.9* 27.3*  25.7* 25.8* 24.1*  MCV 105.1* 103.6* 103.9*  --  102.0* 101.3*  PLT 151 133* 128*  --  137* 162   Cardiac Enzymes: No results for input(s): CKTOTAL, CKMB, CKMBINDEX, TROPONINI in the last 168 hours. BNP: Invalid input(s): POCBNP CBG: Recent Labs  Lab 03/08/19 0822 03/08/19 1109  GLUCAP 93 164*   D-Dimer No results for input(s): DDIMER in the last 72 hours. Hgb A1c No results for input(s): HGBA1C in the last 72 hours. Lipid Profile No results for input(s): CHOL, HDL, LDLCALC, TRIG, CHOLHDL, LDLDIRECT in the last 72 hours. Thyroid function studies Recent Labs    03/06/19 1407  TSH 0.771   Anemia work up Recent Labs    03/06/19 1407  VITAMINB12 276    Urinalysis    Component Value Date/Time   COLORURINE YELLOW 01/27/2017 0627   APPEARANCEUR CLEAR 01/27/2017 0627   LABSPEC 1.012 01/27/2017 0627   PHURINE 6.0 01/27/2017 0627   GLUCOSEU NEGATIVE 01/27/2017 0627   HGBUR NEGATIVE 01/27/2017 0627   BILIRUBINUR NEGATIVE 01/27/2017 1886  KETONESUR NEGATIVE 01/27/2017 0627   PROTEINUR NEGATIVE 01/27/2017 0627   UROBILINOGEN 0.2 05/08/2010 2203   NITRITE NEGATIVE 01/27/2017 0627   LEUKOCYTESUR NEGATIVE 01/27/2017 16100627   Sepsis Labs Invalid input(s): PROCALCITONIN,  WBC,  LACTICIDVEN Microbiology Recent Results (from the past 240 hour(s))  SARS CORONAVIRUS 2 (TAT 6-24 HRS) Nasopharyngeal Nasopharyngeal Swab     Status: None   Collection Time: 02/28/19  8:09 AM   Specimen: Nasopharyngeal Swab  Result Value Ref Range Status   SARS Coronavirus 2 NEGATIVE NEGATIVE Final    Comment: (NOTE) SARS-CoV-2 target nucleic acids are NOT DETECTED. The SARS-CoV-2 RNA is generally detectable in upper and lower respiratory specimens during the acute phase of infection. Negative results do not preclude SARS-CoV-2 infection, do not rule out co-infections with other pathogens, and should not be used as the sole basis for treatment or other patient management decisions. Negative results must be combined with clinical observations, patient history, and epidemiological information. The expected result is Negative. Fact Sheet for Patients: HairSlick.nohttps://www.fda.gov/media/138098/download Fact Sheet for Healthcare Providers: quierodirigir.comhttps://www.fda.gov/media/138095/download This test is not yet approved or cleared by the [place] FDA and  has been authorized for detection and/or diagnosis of SARS-CoV-2 by FDA under an Emergency Use Authorization (EUA). This EUA will remain  in effect (meaning this test can be used) for the duration of the COVID-19 declaration under Section 56 4(b)(1) of the Act, 21 U.S.C. section 360bbb-3(b)(1), unless the authorization is  terminated or revoked sooner. Performed at [facility], [address] [place], [place] [address]   SARS CORONAVIRUS 2 (TAT 6-24 HRS) Nasopharyngeal Nasopharyngeal Swab     Status: None   Collection Time: 03/06/19  2:23 PM   Specimen: Nasopharyngeal Swab  Result Value Ref Range Status   SARS Coronavirus 2 NEGATIVE NEGATIVE Final    Comment: (NOTE) SARS-CoV-2 target nucleic acids are NOT DETECTED. The SARS-CoV-2 RNA is generally detectable in upper and lower respiratory specimens during the acute phase of infection. Negative results do not preclude SARS-CoV-2 infection, do not rule out co-infections with other pathogens, and should not be used as the sole basis for treatment or other patient management decisions. Negative results must be combined with clinical observations, patient history, and epidemiological information. The expected result is Negative. Fact Sheet for Patients: HairSlick.nohttps://www.fda.gov/media/138098/download Fact Sheet for Healthcare Providers: quierodirigir.comhttps://www.fda.gov/media/138095/download This test is not yet approved or cleared by the [place] FDA and  has been authorized for detection and/or diagnosis of SARS-CoV-2 by FDA under an Emergency Use Authorization (EUA). This EUA will remain  in effect (meaning this test can be used) for the duration of the COVID-19 declaration under Section 56 4(b)(1) of the Act, 21 U.S.C. section 360bbb-3(b)(1), unless the authorization is terminated or revoked sooner. Performed at [facility], [address] [place], [place] [address]      Time coordinating discharge: 35mins  SIGNED:   Erick BlinksJehanzeb , MD  [facility] 03/08/2019, 11:34 PM   If 7PM-7AM, please contact night-coverage [email or web address]

## 2019-03-10 DIAGNOSIS — R3915 Urgency of urination: Secondary | ICD-10-CM | POA: Diagnosis not present

## 2019-03-10 DIAGNOSIS — R310 Gross hematuria: Secondary | ICD-10-CM | POA: Diagnosis not present

## 2019-03-10 DIAGNOSIS — R351 Nocturia: Secondary | ICD-10-CM | POA: Diagnosis not present

## 2019-03-10 DIAGNOSIS — N401 Enlarged prostate with lower urinary tract symptoms: Secondary | ICD-10-CM | POA: Diagnosis not present

## 2019-03-16 DIAGNOSIS — I471 Supraventricular tachycardia: Secondary | ICD-10-CM | POA: Diagnosis not present

## 2019-03-16 DIAGNOSIS — Z9079 Acquired absence of other genital organ(s): Secondary | ICD-10-CM | POA: Diagnosis not present

## 2019-03-16 DIAGNOSIS — Z23 Encounter for immunization: Secondary | ICD-10-CM | POA: Diagnosis not present

## 2019-03-16 DIAGNOSIS — D649 Anemia, unspecified: Secondary | ICD-10-CM | POA: Diagnosis not present

## 2019-03-22 DIAGNOSIS — R310 Gross hematuria: Secondary | ICD-10-CM | POA: Diagnosis not present

## 2019-03-22 DIAGNOSIS — N401 Enlarged prostate with lower urinary tract symptoms: Secondary | ICD-10-CM | POA: Diagnosis not present

## 2019-03-31 DIAGNOSIS — F209 Schizophrenia, unspecified: Secondary | ICD-10-CM | POA: Diagnosis not present

## 2019-03-31 NOTE — Progress Notes (Signed)
PCP:  Georgann Housekeeper, MD Primary Cardiologist: No primary care provider on file. Electrophysiologist: Dr. Gus Rankin is a 63 y.o. male who presents today for routine electrophysiology followup. They are seen for Dr Johney Frame for recurrent SVT. Since discharge, the patient reports doing well overall. Recent labwork showed his Hgb up to 11 per his sister. He denies symptoms of palpitations, chest pain, shortness of breath, orthopnea, PND, lower extremity edema, claudication, dizziness, presyncope, syncope, bleeding, or neurologic sequela. The patient is tolerating medications without difficulties.   He is not interested in surgery at this time, especially in the absence of   The patient feels that he is tolerating medications without difficulties and is otherwise without complaint today.   Past Medical History:  Diagnosis Date  . Anemia   . Benign localized prostatic hyperplasia with lower urinary tract symptoms (LUTS)   . History of alcohol abuse   . History of chest pain    ED visit 01-17-2016 dx unspecified chest pain, GERD, hyponatremia  . History of supraventricular tachycardia    ED visit 12-15-2015;  per d/c note by dr allred (cardiology) to follow up as outpt for ETT and Echo  . History of syncope    ED visit 01-27-2017 w/ LOC-- situational (bowel movement) and vasovagal  . Schizophrenia, chronic condition [facility])    Past Surgical History:  Procedure Laterality Date  . TRANSURETHRAL RESECTION OF PROSTATE N/A 03/03/2019   Procedure: TRANSURETHRAL RESECTION OF THE PROSTATE (TURP);  Surgeon: Malen Gauze, MD;  Location: [facility];  Service: Urology;  Laterality: N/A;    Current Outpatient Medications  Medication Sig Dispense Refill  . metoprolol tartrate (LOPRESSOR) 25 MG tablet Take 1 tablet (25 mg total) by mouth 2 (two) times daily. 60 tablet 0  . risperiDONE (RISPERDAL) 1 MG tablet Take 1 mg by mouth 2 (two) times daily.      No current  facility-administered medications for this visit.     No Known Allergies  Social History   Socioeconomic History  . Marital status: Married    Spouse name: Not on file  . Number of children: Not on file  . Years of education: Not on file  . Highest education level: Not on file  Occupational History  . Not on file  Social Needs  . Financial resource strain: Not on file  . Food insecurity    Worry: Not on file    Inability: Not on file  . Transportation needs    Medical: Not on file    Non-medical: Not on file  Tobacco Use  . Smoking status: Never Smoker  . Smokeless tobacco: Never Used  Substance and Sexual Activity  . Alcohol use: No  . Drug use: No  . Sexual activity: Not on file  Lifestyle  . Physical activity    Days per week: Not on file    Minutes per session: Not on file  . Stress: Not on file  Relationships  . Social Musician on phone: Not on file    Gets together: Not on file    Attends religious service: Not on file    Active member of club or organization: Not on file    Attends meetings of clubs or organizations: Not on file    Relationship status: Not on file  . Intimate partner violence    Fear of current or ex partner: Not on file    Emotionally abused: Not on file  Physically abused: Not on file    Forced sexual activity: Not on file  Other Topics Concern  . Not on file  Social History Narrative   Lives with sister.     Review of Systems: General: No chills, fever, night sweats or weight changes  Cardiovascular:  No chest pain, dyspnea on exertion, edema, orthopnea, palpitations, paroxysmal nocturnal dyspnea Dermatological: No rash, lesions or masses Respiratory: No cough, dyspnea Urologic: No hematuria, dysuria Abdominal: No nausea, vomiting, diarrhea, bright red blood per rectum, melena, or hematemesis Neurologic: No visual changes, weakness, changes in mental status All other systems reviewed and are otherwise negative  except as noted above.  Physical Exam: Vitals:   04/01/19 1000 04/01/19 1020  BP: 108/66   Pulse: (!) 48 (!) 52  SpO2: 99%   Weight: 173 lb 6.4 oz (78.7 kg)   Height: 5\' 7"  (1.702 m)     GEN- The patient is well appearing, alert and oriented x 3 today.   HEENT: normocephalic, atraumatic; sclera clear, conjunctiva pink; hearing intact; oropharynx clear; neck supple, no JVP Lymph- no cervical lymphadenopathy Lungs- Clear to ausculation bilaterally, normal work of breathing.  No wheezes, rales, rhonchi Heart- Regular rate and rhythm, no murmurs, rubs or gallops, PMI not laterally displaced GI- soft, non-tender, non-distended, bowel sounds present, no hepatosplenomegaly Extremities- no clubbing, cyanosis, or edema; DP/PT/radial pulses 2+ bilaterally MS- no significant deformity or atrophy Skin- warm and dry, no rash or lesion Psych- euthymic mood, full affect Neuro- strength and sensation are intact  EKG is not ordered today. Personal review of EKG 03/06/2019 shows NSR 72 bpm, PR interval 158 ms, QRS 82 ms  Assessment and Plan: 1. SVT Noted in hospital 03/2019, to have symptomatic short RP SVT, which was his second clinical episode.  Avoid CCB with constipation Continue lopressor 25 mg BID Have previously discussed SVT abaltion, Pt is not interested at this time. Will continue to follow. Pt knows to call if he has further tachypalpitations or reconsiders surgery. Otherwise, I will plan on seeing him in 6 months for follow up. Can call sooner with symptoms.   2. Bradycardia Asymptomatic. Intervals on recent EKGs stable. Follow.  Rates OK with exertion, He has a smart watch at home. He will continue to follow, and will alert [place] of it drops below 40, or if he becomes symptomatic.   Shirley Friar, PA-C  04/01/19 10:17 AM

## 2019-04-01 ENCOUNTER — Other Ambulatory Visit: Payer: Self-pay

## 2019-04-01 ENCOUNTER — Ambulatory Visit (INDEPENDENT_AMBULATORY_CARE_PROVIDER_SITE_OTHER): Payer: Medicare Other | Admitting: Student

## 2019-04-01 ENCOUNTER — Encounter: Payer: Self-pay | Admitting: Student

## 2019-04-01 VITALS — BP 108/66 | HR 52 | Ht 67.0 in | Wt 173.4 lb

## 2019-04-01 DIAGNOSIS — I471 Supraventricular tachycardia: Secondary | ICD-10-CM

## 2019-04-01 MED ORDER — METOPROLOL TARTRATE 25 MG PO TABS: 25.0000 mg | ORAL_TABLET | Freq: Two times a day (BID) | ORAL | 3 refills | Status: DC

## 2019-04-01 NOTE — Patient Instructions (Signed)
Medication Instructions:  none If you need a refill on your cardiac medications before your next appointment, please call your pharmacy.   Lab work: none If you have labs (blood work) drawn today and your tests are completely normal, you will receive your results only by: Marland Kitchen MyChart Message (if you have MyChart) OR . A paper copy in the mail If you have any lab test that is abnormal or we need to change your treatment, we will call you to review the results.  Testing/Procedures: none  Follow-Up: 6 months with [place], you and your health needs are our priority.  As part of our continuing mission to provide you with exceptional heart care, we have created designated Provider Care Teams.  These Care Teams include your primary Cardiologist (physician) and Advanced Practice Providers (APPs -  Physician Assistants and Nurse Practitioners) who all work together to provide you with the care you need, when you need it. .   Any Other Special Instructions Will Be Listed Below (If Applicable).

## 2019-04-20 DIAGNOSIS — F209 Schizophrenia, unspecified: Secondary | ICD-10-CM | POA: Diagnosis not present

## 2019-05-23 DIAGNOSIS — I471 Supraventricular tachycardia: Secondary | ICD-10-CM | POA: Diagnosis not present

## 2019-05-23 DIAGNOSIS — R7309 Other abnormal glucose: Secondary | ICD-10-CM | POA: Diagnosis not present

## 2019-05-23 DIAGNOSIS — Z23 Encounter for immunization: Secondary | ICD-10-CM | POA: Diagnosis not present

## 2019-05-23 DIAGNOSIS — Z0000 Encounter for general adult medical examination without abnormal findings: Secondary | ICD-10-CM | POA: Diagnosis not present

## 2019-05-23 DIAGNOSIS — N400 Enlarged prostate without lower urinary tract symptoms: Secondary | ICD-10-CM | POA: Diagnosis not present

## 2019-05-23 DIAGNOSIS — F209 Schizophrenia, unspecified: Secondary | ICD-10-CM | POA: Diagnosis not present

## 2019-05-23 DIAGNOSIS — Z1211 Encounter for screening for malignant neoplasm of colon: Secondary | ICD-10-CM | POA: Diagnosis not present

## 2019-05-23 DIAGNOSIS — Z1389 Encounter for screening for other disorder: Secondary | ICD-10-CM | POA: Diagnosis not present

## 2019-05-23 DIAGNOSIS — E7800 Pure hypercholesterolemia, unspecified: Secondary | ICD-10-CM | POA: Diagnosis not present

## 2019-05-31 DIAGNOSIS — F209 Schizophrenia, unspecified: Secondary | ICD-10-CM | POA: Diagnosis not present

## 2019-07-28 ENCOUNTER — Other Ambulatory Visit: Payer: Medicare Other

## 2019-07-29 ENCOUNTER — Ambulatory Visit: Payer: Medicare Other | Attending: Internal Medicine

## 2019-07-29 DIAGNOSIS — Z20822 Contact with and (suspected) exposure to covid-19: Secondary | ICD-10-CM

## 2019-07-30 LAB — NOVEL CORONAVIRUS, NAA: SARS-CoV-2, NAA: NOT DETECTED

## 2019-09-22 ENCOUNTER — Ambulatory Visit: Payer: Medicare Other | Attending: Internal Medicine

## 2019-09-22 DIAGNOSIS — Z23 Encounter for immunization: Secondary | ICD-10-CM

## 2019-09-22 NOTE — Progress Notes (Signed)
   Covid-19 Vaccination Clinic  Name:  Billy Boyd    MRN: [id number] DOB: [birth year]  09/22/2019  Billy Boyd was observed post Covid-19 immunization for 15 minutes without incident. He was provided with Vaccine Information Sheet and instruction to access the V-Safe system.   Billy Boyd was instructed to call 911 with any severe reactions post vaccine: Marland Kitchen Difficulty breathing  . Swelling of face and throat  . A fast heartbeat  . A bad rash all over body  . Dizziness and weakness   Immunizations Administered    Name Date Dose VIS Date Route   Pfizer COVID-19 Vaccine 09/22/2019  2:46 PM 0.3 mL 06/10/2019 Intramuscular   Manufacturer: [employer], [employer]   Lot: [id number]   NDC: [address]-4

## 2019-10-18 ENCOUNTER — Ambulatory Visit: Payer: Medicare Other | Attending: Internal Medicine

## 2019-10-18 DIAGNOSIS — Z23 Encounter for immunization: Secondary | ICD-10-CM

## 2019-10-18 NOTE — Progress Notes (Signed)
   Covid-19 Vaccination Clinic  Name:  Billy Boyd    MRN: [id number] DOB: 25-Dec-1955  10/18/2019  Billy Boyd was observed post Covid-19 immunization for 15 minutes   without incident. He was provided with Vaccine Information Sheet and instruction to access the V-Safe system.   Billy Boyd was instructed to call 911 with any severe reactions post vaccine: Marland Kitchen Difficulty breathing  . Swelling of face and throat  . A fast heartbeat  . A bad rash all over body  . Dizziness and weakness   Immunizations Administered    Name Date Dose VIS Date Route   Pfizer COVID-19 Vaccine 10/18/2019 12:05 PM 0.3 mL 08/24/2018 Intramuscular   Manufacturer: [employer], [employer]   Lot: [id number]   NDC: [address]-0

## 2020-12-04 DIAGNOSIS — I471 Supraventricular tachycardia: Secondary | ICD-10-CM | POA: Diagnosis not present

## 2020-12-04 DIAGNOSIS — R7303 Prediabetes: Secondary | ICD-10-CM | POA: Diagnosis not present

## 2020-12-04 DIAGNOSIS — E7800 Pure hypercholesterolemia, unspecified: Secondary | ICD-10-CM | POA: Diagnosis not present

## 2021-01-11 IMAGING — DX DG CHEST 1V PORT
1 series · 1 of 1 positions shown · non-contrast
Comparison: 12/18/2015; 12/15/2015

CLINICAL DATA: Syncopal episode this morning.  SVT.

EXAM:
PORTABLE CHEST 1 VIEW

[chest ap]
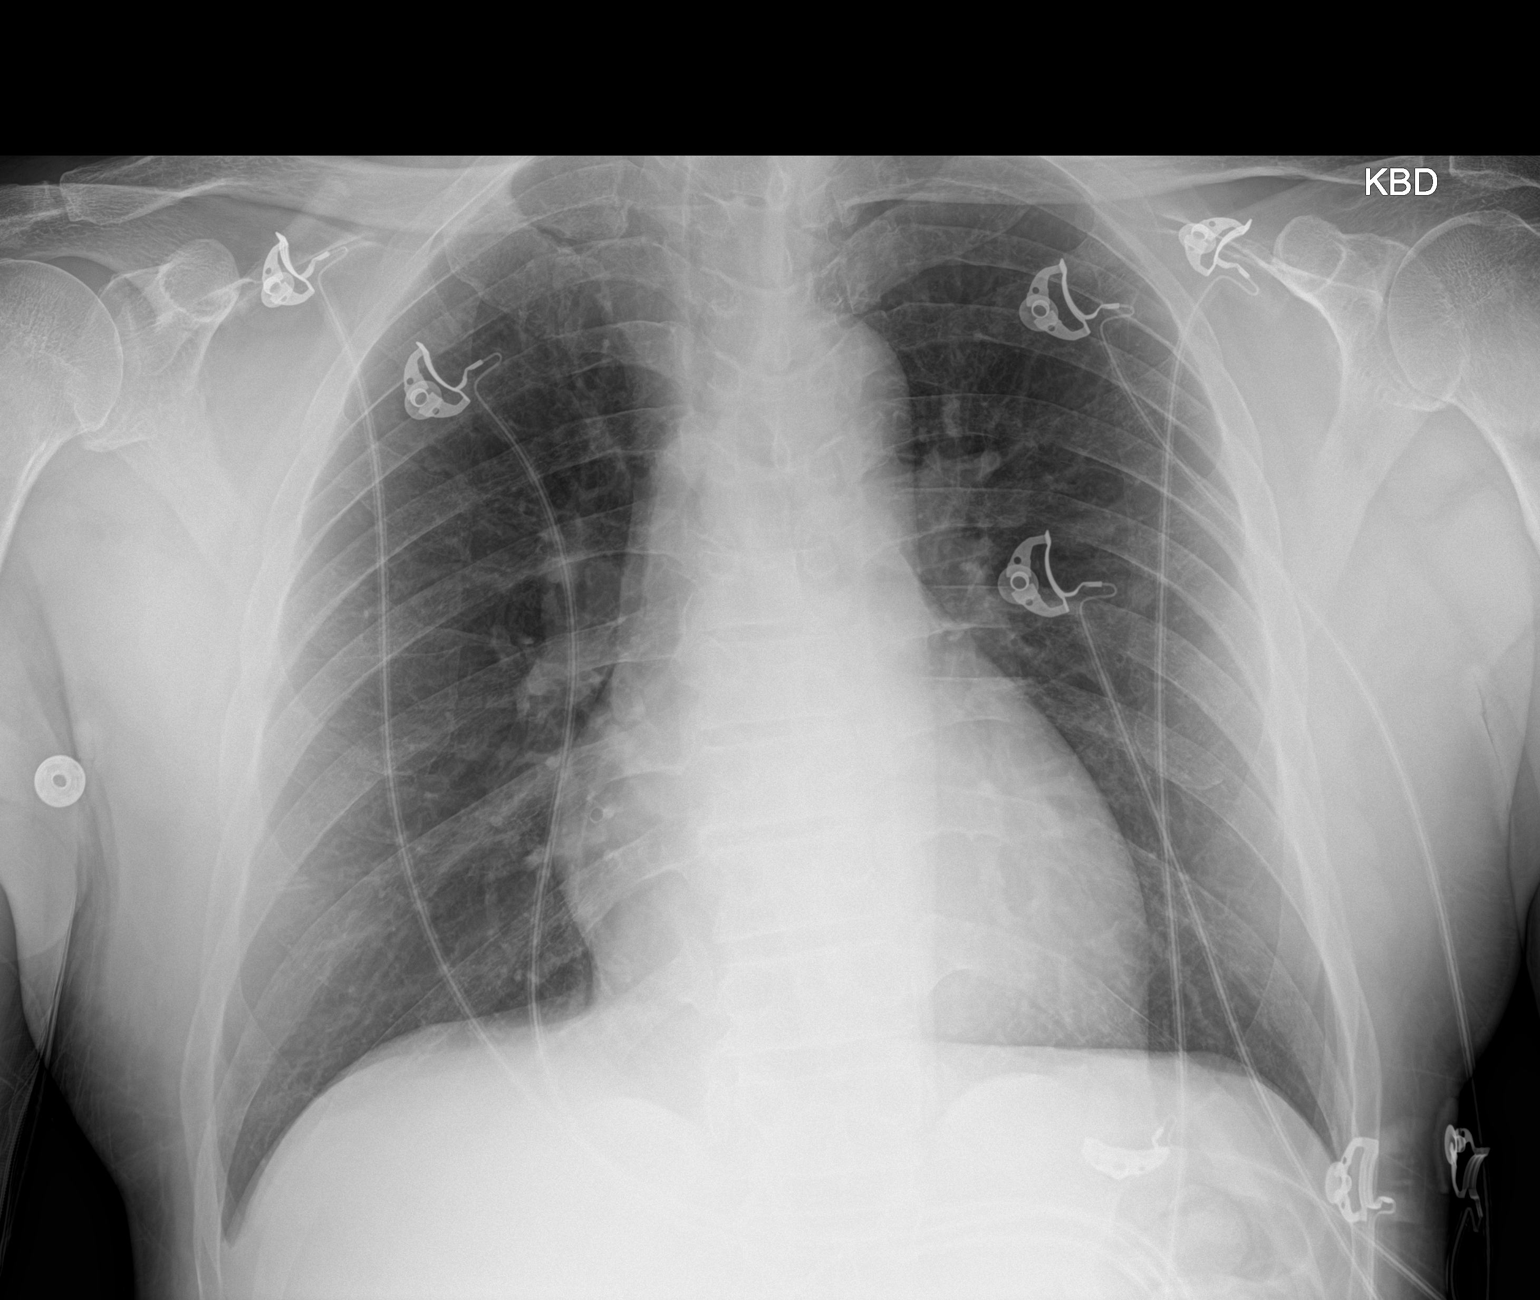

[1 of 1 positions shown; findings below may reference images not displayed]

FINDINGS: Grossly unchanged borderline enlarged cardiac silhouette. Normal
mediastinal contours. No focal parenchymal opacities. No pleural
effusion or pneumothorax. No evidence of edema. No acute osseous
abnormalities.
IMPRESSION: Borderline cardiomegaly without superimposed acute cardiopulmonary
disease. Specifically, no definite evidence of pulmonary edema.

## 2021-02-04 DIAGNOSIS — Z1152 Encounter for screening for COVID-19: Secondary | ICD-10-CM | POA: Diagnosis not present

## 2021-02-04 DIAGNOSIS — U071 COVID-19: Secondary | ICD-10-CM | POA: Diagnosis not present

## 2021-02-04 DIAGNOSIS — Z9189 Other specified personal risk factors, not elsewhere classified: Secondary | ICD-10-CM | POA: Diagnosis not present

## 2021-02-11 DIAGNOSIS — R059 Cough, unspecified: Secondary | ICD-10-CM | POA: Diagnosis not present

## 2021-02-11 DIAGNOSIS — Z1152 Encounter for screening for COVID-19: Secondary | ICD-10-CM | POA: Diagnosis not present

## 2021-02-11 DIAGNOSIS — Z9189 Other specified personal risk factors, not elsewhere classified: Secondary | ICD-10-CM | POA: Diagnosis not present

## 2021-06-10 DIAGNOSIS — E7800 Pure hypercholesterolemia, unspecified: Secondary | ICD-10-CM | POA: Diagnosis not present

## 2021-06-10 DIAGNOSIS — Z5181 Encounter for therapeutic drug level monitoring: Secondary | ICD-10-CM | POA: Diagnosis not present

## 2021-07-03 DIAGNOSIS — K648 Other hemorrhoids: Secondary | ICD-10-CM | POA: Diagnosis not present

## 2021-07-03 DIAGNOSIS — K621 Rectal polyp: Secondary | ICD-10-CM | POA: Diagnosis not present

## 2021-07-03 DIAGNOSIS — Z8371 Family history of colonic polyps: Secondary | ICD-10-CM | POA: Diagnosis not present

## 2021-07-03 DIAGNOSIS — Z1211 Encounter for screening for malignant neoplasm of colon: Secondary | ICD-10-CM | POA: Diagnosis not present

## 2021-07-03 DIAGNOSIS — K5730 Diverticulosis of large intestine without perforation or abscess without bleeding: Secondary | ICD-10-CM | POA: Diagnosis not present

## 2021-07-03 DIAGNOSIS — Z800 Family history of malignant neoplasm of digestive organs: Secondary | ICD-10-CM | POA: Diagnosis not present

## 2021-07-08 DIAGNOSIS — K621 Rectal polyp: Secondary | ICD-10-CM | POA: Diagnosis not present

## 2021-08-07 DIAGNOSIS — Z1389 Encounter for screening for other disorder: Secondary | ICD-10-CM | POA: Diagnosis not present

## 2021-08-07 DIAGNOSIS — Z8679 Personal history of other diseases of the circulatory system: Secondary | ICD-10-CM | POA: Diagnosis not present

## 2021-08-07 DIAGNOSIS — R7303 Prediabetes: Secondary | ICD-10-CM | POA: Diagnosis not present

## 2021-08-07 DIAGNOSIS — E7800 Pure hypercholesterolemia, unspecified: Secondary | ICD-10-CM | POA: Diagnosis not present

## 2021-08-07 DIAGNOSIS — Z23 Encounter for immunization: Secondary | ICD-10-CM | POA: Diagnosis not present

## 2021-08-07 DIAGNOSIS — Z0000 Encounter for general adult medical examination without abnormal findings: Secondary | ICD-10-CM | POA: Diagnosis not present

## 2021-09-19 ENCOUNTER — Encounter: Payer: Self-pay | Admitting: Internal Medicine

## 2021-09-19 ENCOUNTER — Other Ambulatory Visit: Payer: Self-pay

## 2021-09-19 ENCOUNTER — Ambulatory Visit: Payer: Medicare Other | Admitting: Internal Medicine

## 2021-09-19 VITALS — BP 98/58 | HR 58 | Ht 67.0 in | Wt 163.0 lb

## 2021-09-19 DIAGNOSIS — I471 Supraventricular tachycardia: Secondary | ICD-10-CM | POA: Diagnosis not present

## 2021-09-19 NOTE — Progress Notes (Signed)
? ?  PCP: Georgann Housekeeper, MD ?Primary Cardiologist: Dr Eldridge Dace ?Primary EP: Dr Johney Frame ? ?Billy Boyd is a 66 y.o. male who presents today for routine electrophysiology followup.  Since last being seen in our clinic, the patient reports doing very well.  Today, he denies symptoms of palpitations, chest pain, shortness of breath,  lower extremity edema, dizziness, presyncope, or syncope.  The patient is otherwise without complaint today.  ? ?Past Medical History:  ?Diagnosis Date  ? Anemia   ? Benign localized prostatic hyperplasia with lower urinary tract symptoms (LUTS)   ? History of alcohol abuse   ? History of chest pain   ? ED visit 01-17-2016 dx unspecified chest pain, GERD, hyponatremia  ? History of supraventricular tachycardia   ? ED visit 12-15-2015;  per d/c note by dr Woodard Perrell (cardiology) to follow up as outpt for ETT and Echo  ? History of syncope   ? ED visit 01-27-2017 w/ LOC-- situational (bowel movement) and vasovagal  ? Schizophrenia, chronic condition (HCC)   ? ?Past Surgical History:  ?Procedure Laterality Date  ? TRANSURETHRAL RESECTION OF PROSTATE N/A 03/03/2019  ? Procedure: TRANSURETHRAL RESECTION OF THE PROSTATE (TURP);  Surgeon: Malen Gauze, MD;  Location: [facility];  Service: Urology;  Laterality: N/A;  ? ? ?ROS- all systems are reviewed and negatives except as per HPI above ? ?Current Outpatient Medications  ?Medication Sig Dispense Refill  ? metoprolol tartrate (LOPRESSOR) 25 MG tablet Take 1 tablet (25 mg total) by mouth 2 (two) times daily. 180 tablet 3  ? risperiDONE (RISPERDAL) 1 MG tablet Take 1 mg by mouth 2 (two) times daily.     ? ?No current facility-administered medications for this visit.  ? ? ?Physical Exam: ?Vitals:  ? 09/19/21 0944  ?BP: (!) 98/58  ?Pulse: (!) 58  ?SpO2: 98%  ?Weight: 163 lb (73.9 kg)  ?Height: 5\' 7"  (1.702 m)  ? ? ?GEN- The patient is well appearing, alert and oriented x 3 today.   ?Head- normocephalic, atraumatic ?Eyes-  Sclera  clear, conjunctiva pink ?Ears- hearing intact ?Oropharynx- clear ?Lungs- Clear to ausculation bilaterally, normal work of breathing ?Heart- Regular rate and rhythm, no murmurs, rubs or gallops, PMI not laterally displaced ?GI- soft, NT, ND, + BS ?Extremities- no clubbing, cyanosis, or edema ? ?Wt Readings from Last 3 Encounters:  ?09/19/21 163 lb (73.9 kg)  ?04/01/19 173 lb 6.4 oz (78.7 kg)  ?03/08/19 166 lb 14.2 oz (75.7 kg)  ? ? ?EKG tracing ordered today is personally reviewed and shows sinus ? ?Assessment and Plan: ? ?SVT ?Previously documented to have recurrent short RP SVT.  No episodes since September.  Not currently interested in ablation ? ?2. Sinus bradycardia ?Asymptomatic ?No indication for pacing at this time ? ?Return to see EP APP in a year ? ?October MD, FACC ?09/19/2021 ?9:55 AM ? ? ? ? ?

## 2021-09-19 NOTE — Patient Instructions (Addendum)
Medication Instructions:  ?Your physician recommends that you continue on your current medications as directed. Please refer to the Current Medication list given to you today. ?*If you need a refill on your cardiac medications before your next appointment, please call your pharmacy* ? ?Lab Work: ?None ordered. ?If you have labs (blood work) drawn today and your tests are completely normal, you will receive your results only by: ?MyChart Message (if you have MyChart) OR ?A paper copy in the mail ?If you have any lab test that is abnormal or we need to change your treatment, we will call you to review the results. ? ?Testing/Procedures: ?None ordered. ? ?Follow-Up: ?At [facility], you and your health needs are our priority.  As part of our continuing mission to provide you with exceptional heart care, we have created designated Provider Care Teams.  These Care Teams include your primary Cardiologist (physician) and Advanced Practice Providers (APPs -  Physician Assistants and Nurse Practitioners) who all work together to provide you with the care you need, when you need it. ? ?Your next appointment:   ?Your physician wants you to follow-up in: one year with Otilio Saber, PA ?You will receive a reminder letter in the mail two months in advance. If you don't receive a letter, please call our office to schedule the follow-up appointment. ? ?

## 2022-01-07 DIAGNOSIS — R001 Bradycardia, unspecified: Secondary | ICD-10-CM | POA: Diagnosis not present

## 2022-01-07 DIAGNOSIS — R42 Dizziness and giddiness: Secondary | ICD-10-CM | POA: Diagnosis not present

## 2022-01-07 DIAGNOSIS — R55 Syncope and collapse: Secondary | ICD-10-CM | POA: Diagnosis not present

## 2022-02-06 DIAGNOSIS — R001 Bradycardia, unspecified: Secondary | ICD-10-CM | POA: Diagnosis not present

## 2022-02-06 DIAGNOSIS — R42 Dizziness and giddiness: Secondary | ICD-10-CM | POA: Diagnosis not present

## 2022-08-08 ENCOUNTER — Emergency Department (HOSPITAL_COMMUNITY): Payer: Medicare Other

## 2022-08-08 ENCOUNTER — Emergency Department (HOSPITAL_COMMUNITY)
Admission: EM | Admit: 2022-08-08 | Discharge: 2022-08-08 | Disposition: A | Discharge status: Home / Self Care | Payer: Medicare Other | Attending: Emergency Medicine | Admitting: Emergency Medicine

## 2022-08-08 ENCOUNTER — Encounter (HOSPITAL_COMMUNITY): Payer: Self-pay

## 2022-08-08 DIAGNOSIS — I499 Cardiac arrhythmia, unspecified: Secondary | ICD-10-CM | POA: Diagnosis not present

## 2022-08-08 DIAGNOSIS — I471 Supraventricular tachycardia, unspecified: Secondary | ICD-10-CM

## 2022-08-08 DIAGNOSIS — Z0000 Encounter for general adult medical examination without abnormal findings: Secondary | ICD-10-CM | POA: Diagnosis not present

## 2022-08-08 DIAGNOSIS — R7303 Prediabetes: Secondary | ICD-10-CM | POA: Diagnosis not present

## 2022-08-08 DIAGNOSIS — Z1389 Encounter for screening for other disorder: Secondary | ICD-10-CM | POA: Diagnosis not present

## 2022-08-08 DIAGNOSIS — Z743 Need for continuous supervision: Secondary | ICD-10-CM | POA: Diagnosis not present

## 2022-08-08 DIAGNOSIS — E7800 Pure hypercholesterolemia, unspecified: Secondary | ICD-10-CM | POA: Diagnosis not present

## 2022-08-08 DIAGNOSIS — R079 Chest pain, unspecified: Secondary | ICD-10-CM | POA: Diagnosis present

## 2022-08-08 LAB — BASIC METABOLIC PANEL
Anion gap: 9 (ref 5–15)
BUN: 18 mg/dL (ref 8–23)
CO2: 29 mmol/L (ref 22–32)
Calcium: 9.2 mg/dL (ref 8.9–10.3)
Chloride: 104 mmol/L (ref 98–111)
Creatinine, Ser: 1.19 mg/dL (ref 0.61–1.24)
GFR, Estimated: >60 mL/min (ref >60)
Glucose, Bld: 115 mg/dL — ABNORMAL HIGH (ref 70–99)
Potassium: 3.7 mmol/L (ref 3.5–5.1)
Sodium: 142 mmol/L (ref 135–145)

## 2022-08-08 LAB — CBC WITH DIFFERENTIAL/PLATELET
Abs Immature Granulocytes: 0.01 10*3/uL (ref 0.00–0.07)
Basophils Absolute: 0 10*3/uL (ref 0.0–0.1)
Basophils Relative: 1 %
Eosinophils Absolute: 0 10*3/uL (ref 0.0–0.5)
Eosinophils Relative: 1 %
HCT: 37.9 % — ABNORMAL LOW (ref 39.0–52.0)
Hemoglobin: 13.7 g/dL (ref 13.0–17.0)
Immature Granulocytes: 0 %
Lymphocytes Relative: 49 %
Lymphs Abs: 1.6 10*3/uL (ref 0.7–4.0)
MCH: 36.7 pg — ABNORMAL HIGH (ref 26.0–34.0)
MCHC: 36.1 g/dL — ABNORMAL HIGH (ref 30.0–36.0)
MCV: 101.6 fL — ABNORMAL HIGH (ref 80.0–100.0)
Monocytes Absolute: 0.3 10*3/uL (ref 0.1–1.0)
Monocytes Relative: 9 %
Neutro Abs: 1.3 10*3/uL — ABNORMAL LOW (ref 1.7–7.7)
Neutrophils Relative %: 40 %
Platelets: 205 10*3/uL (ref 150–400)
RBC: 3.73 MIL/uL — ABNORMAL LOW (ref 4.22–5.81)
RDW: 13 % (ref 11.5–15.5)
WBC: 3.2 10*3/uL — ABNORMAL LOW (ref 4.0–10.5)
nRBC: 0 % (ref 0.0–0.2)

## 2022-08-08 LAB — MAGNESIUM: Magnesium: 2.4 mg/dL (ref 1.7–2.4)

## 2022-08-08 MED ORDER — DILTIAZEM LOAD VIA INFUSION
10.0000 mg | Freq: Once | INTRAVENOUS | Status: AC
  Administered 2022-08-08: 10 mg via INTRAVENOUS
  Filled 2022-08-08: qty 10

## 2022-08-08 MED ORDER — METOPROLOL TARTRATE 25 MG PO TABS
12.5000 mg | ORAL_TABLET | Freq: Once | ORAL | Status: AC
  Administered 2022-08-08: 12.5 mg via ORAL

## 2022-08-08 MED ORDER — DILTIAZEM HCL-DEXTROSE 125-5 MG/125ML-% IV SOLN (PREMIX): 5.0000 mg/h | INTRAVENOUS | Status: DC

## 2022-08-08 MED ORDER — LABETALOL HCL 5 MG/ML IV SOLN: 10.0000 mg | Freq: Once | INTRAVENOUS | Status: DC

## 2022-08-08 MED ORDER — LACTATED RINGERS IV BOLUS
1000.0000 mL | Freq: Once | INTRAVENOUS | Status: AC
  Administered 2022-08-08: 1000 mL via INTRAVENOUS

## 2022-08-08 MED ORDER — NICARDIPINE HCL IN NACL 20-0.86 MG/200ML-% IV SOLN
INTRAVENOUS | Status: AC
  Filled 2022-08-08: qty 200

## 2022-08-08 MED ORDER — METOPROLOL TARTRATE 25 MG PO TABS: 25.0000 mg | ORAL_TABLET | ORAL | 0 refills | Status: DC | PRN

## 2022-08-08 MED ORDER — DILTIAZEM HCL-DEXTROSE 125-5 MG/125ML-% IV SOLN (PREMIX)
INTRAVENOUS | Status: AC
  Administered 2022-08-08: 5 mg/h via INTRAVENOUS
  Filled 2022-08-08: qty 125

## 2022-08-08 MED ORDER — CLEVIDIPINE BUTYRATE 0.5 MG/ML IV EMUL: 0.0000 mg/h | INTRAVENOUS | Status: DC

## 2022-08-08 MED ORDER — METOPROLOL TARTRATE 25 MG PO TABS
25.0000 mg | ORAL_TABLET | Freq: Once | ORAL | Status: DC
  Filled 2022-08-08: qty 1

## 2022-08-08 NOTE — Discharge Instructions (Addendum)
You are scheduled to see Dr. Curt Bears at 3:15 PM on 08/11/2022 for follow-up of your heart arrhythmia.  You are being given a prescription of metoprolol which you can use in case you have recurrent symptoms.  However if you do this as well as "vagal maneuvers" and you still have high heart rate or any other new/concerning symptoms such as chest pain, shortness of breath, etc. then return to the ER or call 911.

## 2022-08-08 NOTE — ED Triage Notes (Signed)
Pt bib ems from [facility]; pt in for routine visit; called out for SVT initially, rate 190s; 6 adenosine given pta, now in afib rate 80-130; hx svt, no document hx Afib; PCP stopped beta blocker 1 year ago due to bradycardia; 22 RFA; pt initially c/o palpitations; denies sob, cp; no N/V/ diaphoresis; 99% RA, 124/70, a and o x 4

## 2022-08-08 NOTE — ED Provider Notes (Signed)
[facility] Provider Note   CSN: [id number] Arrival date & time: 08/08/22  1025     History  Chief Complaint  Patient presents with   Atrial Fibrillation    Billy Boyd is a 67 y.o. male.  HPI 67 year old male with a history of schizophrenia and prior SVT presents in SVT. This AM felt his heart racing. Feels better now. EMS was called and he was in SVT. They gave 6 mg Adenosine. He is now asymptomatic but still in SVT. Denies recent illness, chest pain, dyspnea or dizziness. His sister arrived and notes that a friend passed away last night and she wonders if that is contributing. He used to be on metoprolol but was taken off as it seemed to no longer be necessary.  Home Medications Prior to Admission medications   Medication Sig Start Date End Date Taking? Authorizing Provider  metoprolol tartrate (LOPRESSOR) 25 MG tablet Take 1 tablet (25 mg total) by mouth as needed (palpitations/high heart rate). 08/08/22  Yes Sherwood Gambler, MD  risperiDONE (RISPERDAL) 1 MG tablet Take 1 mg by mouth 2 (two) times daily.     [provider]      Allergies    Patient has no known allergies.    Review of Systems   Review of Systems  Constitutional:  Negative for fever.  Respiratory:  Negative for shortness of breath.   Cardiovascular:  Positive for palpitations. Negative for chest pain.  Gastrointestinal:  Negative for diarrhea and vomiting.  Neurological:  Negative for light-headedness.    Physical Exam Updated Vital Signs BP 106/71   Pulse (!) 47   Temp 98 F (36.7 C) (Oral)   Resp 18   Ht 5' 7"$  (1.702 m)   Wt 75.8 kg   SpO2 100%   BMI 26.16 kg/m  Physical Exam Vitals and nursing note reviewed.  Constitutional:      General: He is not in acute distress.    Appearance: He is well-developed. He is not ill-appearing or diaphoretic.  HENT:     Head: Normocephalic and atraumatic.  Cardiovascular:     Rate and Rhythm:  Regular rhythm. Tachycardia present.     Heart sounds: Normal heart sounds.  Pulmonary:     Effort: Pulmonary effort is normal.     Breath sounds: Normal breath sounds. No wheezing or rales.  Abdominal:     General: There is no distension.  Skin:    General: Skin is warm and dry.  Neurological:     Mental Status: He is alert.     ED Results / Procedures / Treatments   Labs (all labs ordered are listed, but only abnormal results are displayed) Labs Reviewed  BASIC METABOLIC PANEL - Abnormal; Notable for the following components:      Result Value   Glucose, Bld 115 (*)    All other components within normal limits  CBC WITH DIFFERENTIAL/PLATELET - Abnormal; Notable for the following components:   WBC 3.2 (*)    RBC 3.73 (*)    HCT 37.9 (*)    MCV 101.6 (*)    MCH 36.7 (*)    MCHC 36.1 (*)    Neutro Abs 1.3 (*)    All other components within normal limits  MAGNESIUM    EKG EKG Interpretation  Date/Time:  Friday August 08 2022 11:10:47 EST Ventricular Rate:  62 PR Interval:  169 QRS Duration: 83 QT Interval:  388 QTC Calculation: 394 R Axis:  42 Text Interpretation: Sinus rhythm Atrial premature complex Borderline T abnormalities, inferior leads SVT resolved when compared to earlier in the day Confirmed by Sherwood Gambler [phone]) on 08/08/2022 11:36:20 AM  Radiology DG Chest Portable 1 View  Result Date: 08/08/2022 CLINICAL DATA:  Arrhythmia. EXAM: PORTABLE CHEST 1 VIEW COMPARISON:  03/06/2019 FINDINGS: Telemetry leads and defibrillator/pacing pad overlie the chest. The cardiomediastinal silhouette is unremarkable. There is no evidence of focal airspace disease, pulmonary edema, suspicious pulmonary nodule/mass, pleural effusion, or pneumothorax. No acute bony abnormalities are identified. IMPRESSION: No active disease. Electronically Signed   By: Margarette Canada M.D.   On: 08/08/2022 11:14    Procedures .Critical Care  Performed by: Sherwood Gambler, MD Authorized by:  Sherwood Gambler, MD   Critical care provider statement:    Critical care time (minutes):  35   Critical care time was exclusive of:  Separately billable procedures and treating other patients   Critical care was necessary to treat or prevent imminent or life-threatening deterioration of the following conditions:  Cardiac failure and circulatory failure   Critical care was time spent personally by me on the following activities:  Development of treatment plan with patient or surrogate, discussions with consultants, evaluation of patient's response to treatment, examination of patient, ordering and review of laboratory studies, ordering and review of radiographic studies, ordering and performing treatments and interventions, pulse oximetry, re-evaluation of patient's condition and review of old charts     Medications Ordered in ED Medications  diltiazem (CARDIZEM) 1 mg/mL load via infusion 10 mg (10 mg Intravenous Bolus from Bag 08/08/22 1057)  lactated ringers bolus 1,000 mL (0 mLs Intravenous Stopped 08/08/22 1317)  metoprolol tartrate (LOPRESSOR) tablet 12.5 mg (12.5 mg Oral Given 08/08/22 1318)    ED Course/ Medical Decision Making/ A&P                             Medical Decision Making Amount and/or Complexity of Data Reviewed Labs: ordered.    Details: No anemia or significant electrolyte disturbance. Radiology: ordered and independent interpretation performed.    Details: No CHF or enlarged heart. ECG/medicine tests: ordered and independent interpretation performed.    Details: SVT on first EKG, second EKG with normal sinus rhythm.  Risk Prescription drug management.   Patient presents in SVT.  He is asymptomatic on arrival.  EMS had already given him adenosine.  He initially broke his SVT when I had him do vagal maneuvers but then went right back into SVT.  The seem to be coming and going and so ultimately he was put on diltiazem bolus and drip as he went in and out of SVT multiple  times.  He had no recurrence of SVT for multiple hours.  Discussed with Dr. Radford Pax, recommends giving oral metoprolol 25 mg and then taking off the drip 30 minutes later.  However we ended up cutting this in half because he was already a little bradycardic in the 50s.  Ultimately he has been down into the 40s and also the low 50s on repeat exams.  However no hypotension or systemic symptoms and he feels fine.  He ambulated without difficulty and did not feel any syncope/near syncope.  At this point I think he is stable for discharge I did discuss again with Dr. Radford Pax and she recommends changing it to as needed metoprolol but otherwise she has set up a follow-up with Dr. Curt Bears early next week.  He appears stable  for discharge with return precautions.  No chest pain or ischemic symptoms.        Final Clinical Impression(s) / ED Diagnoses Final diagnoses:  SVT (supraventricular tachycardia)    Rx / DC Orders ED Discharge Orders          Ordered    metoprolol tartrate (LOPRESSOR) 25 MG tablet  As needed        08/08/22 1446              Sherwood Gambler, MD 08/08/22 1614

## 2022-08-08 NOTE — ED Notes (Signed)
DC instructions reviewed with pt. PT verbalized understanding.  PT DC. 

## 2022-08-11 ENCOUNTER — Encounter: Payer: Self-pay | Admitting: Cardiology

## 2022-08-11 ENCOUNTER — Ambulatory Visit: Payer: Medicare Other | Attending: Cardiology | Admitting: Cardiology

## 2022-08-11 VITALS — BP 110/66 | HR 58 | Ht 67.0 in | Wt 162.0 lb

## 2022-08-11 DIAGNOSIS — I480 Paroxysmal atrial fibrillation: Secondary | ICD-10-CM

## 2022-08-11 DIAGNOSIS — I471 Supraventricular tachycardia, unspecified: Secondary | ICD-10-CM

## 2022-08-11 NOTE — Patient Instructions (Signed)
Medication Instructions:  Your physician recommends that you continue on your current medications as directed. Please refer to the Current Medication list given to you today.  *If you need a refill on your cardiac medications before your next appointment, please call your pharmacy*   Lab Work: None ordered   Testing/Procedures: None ordered   Follow-Up: At [facility], you and your health needs are our priority.  As part of our continuing mission to provide you with exceptional heart care, we have created designated Provider Care Teams.  These Care Teams include your primary Cardiologist (physician) and Advanced Practice Providers (APPs -  Physician Assistants and Nurse Practitioners) who all work together to provide you with the care you need, when you need it.  Your next appointment:   6 month(s)  The format for your next appointment:   In Person  Provider:   You will see one of the following Advanced Practice Providers on your designated Care Team:   Tommye Standard, [place] Legrand Como "Jonni Sanger" Chalmers Cater, [place]  Thank you for choosing [facility]!!   Trinidad Curet, RN [phone]

## 2022-08-11 NOTE — Progress Notes (Signed)
Electrophysiology Office Note   Date:  08/11/2022   ID:  Billy Boyd, DOB 1955-10-09, MRN [id number]  PCP:  Billy Low, MD  Cardiologist:  Billy Boyd Primary Electrophysiologist:  Billy Kiener Meredith Leeds, MD    Chief Complaint: SVT   History of Present Illness: Billy Boyd is a 67 y.o. male who is being seen today for the evaluation of SVT at the request of Billy Low, MD. Presenting today for electrophysiology evaluation.  He has a history significant for SVT.  Had multiple episodes of SVT.  He presented to the emergency room 08/08/2022 with an episode of SVT.  He was given adenosine 6 mg with vagal maneuvers which converted him to sinus rhythm.  He did note that his sister passed away the night before his episode.  He was discharged on metoprolol PRN.  He also has ECGs from that visit of atrial fibrillation.  These ECGs appear to be done after a dose of adenosine.  Since being in the emergency room he has done well.  He has noted no further arrhythmias.  He is quite happy with his control.  Today, he denies symptoms of palpitations, chest pain, shortness of breath, orthopnea, PND, lower extremity edema, claudication, dizziness, presyncope, syncope, bleeding, or neurologic sequela. The patient is tolerating medications without difficulties.    Past Medical History:  Diagnosis Date   Anemia    Benign localized prostatic hyperplasia with lower urinary tract symptoms (LUTS)    History of alcohol abuse    History of chest pain    ED visit 01-17-2016 dx unspecified chest pain, GERD, hyponatremia   History of supraventricular tachycardia    ED visit 12-15-2015;  per d/c note by dr Billy Boyd (cardiology) to follow up as outpt for ETT and Echo   History of syncope    ED visit 01-27-2017 w/ LOC-- situational (bowel movement) and vasovagal   Schizophrenia, chronic condition (Billy Boyd)    Past Surgical History:  Procedure Laterality Date   TRANSURETHRAL RESECTION OF PROSTATE N/A 03/03/2019    Procedure: TRANSURETHRAL RESECTION OF THE PROSTATE (TURP);  Surgeon: Billy Gustin, MD;  Location: [facility];  Service: Urology;  Laterality: N/A;     Current Outpatient Medications  Medication Sig Dispense Refill   atorvastatin (LIPITOR) 10 MG tablet Take 10 mg by mouth daily.     cyanocobalamin 100 MCG tablet Take 100 mcg by mouth daily.     metoprolol tartrate (LOPRESSOR) 25 MG tablet Take 1 tablet (25 mg total) by mouth as needed (palpitations/high heart rate). 10 tablet 0   Multiple Vitamin (MULTIVITAMIN ADULT) TABS as directed Orally     risperiDONE (RISPERDAL) 1 MG tablet Take 1 mg by mouth 2 (two) times daily.      No current facility-administered medications for this visit.    Allergies:   Patient has no known allergies.   Social History:  The patient  reports that he has never smoked. He has never used smokeless tobacco. He reports that he does not drink alcohol and does not use drugs.   Family History:  The patient's family history includes Heart disease in his mother.    ROS:  Please see the history of present illness.   Otherwise, review of systems is positive for none.   All other systems are reviewed and negative.    PHYSICAL EXAM: VS:  BP 110/66   Pulse (!) 58   Ht 5' 7"$  (1.702 m)   Wt 162 lb (73.5 kg)   SpO2 99%  BMI 25.37 kg/m  , BMI Body mass index is 25.37 kg/m. GEN: Well nourished, well developed, in no acute distress  HEENT: normal  Neck: no JVD, carotid bruits, or masses Cardiac: RRR; no murmurs, rubs, or gallops,no edema  Respiratory:  clear to auscultation bilaterally, normal work of breathing GI: soft, nontender, nondistended, + BS MS: no deformity or atrophy  Skin: warm and dry Neuro:  Strength and sensation are intact Psych: euthymic mood, full affect  EKG:  EKG is ordered today. Personal review of the ekg ordered  shows sinus rhythm  Recent Labs: 08/08/2022: BUN 18; Creatinine, Ser 1.19; Hemoglobin 13.7; Magnesium  2.4; Platelets 205; Potassium 3.7; Sodium 142    Lipid Panel  No results found for: "CHOL", "TRIG", "HDL", "CHOLHDL", "VLDL", "LDLCALC", "LDLDIRECT"   Wt Readings from Last 3 Encounters:  08/11/22 162 lb (73.5 kg)  08/08/22 167 lb (75.8 kg)  09/19/21 163 lb (73.9 kg)      Other studies Reviewed: Additional studies/ records that were reviewed today include: TTE 2020  Review of the above records today demonstrates:   1. The left ventricle has normal systolic function with an ejection  fraction of 60-65%. The cavity size was normal. Left ventricular diastolic  parameters were normal.   2. The right ventricle has normal systolic function. The cavity was  normal. There is no increase in right ventricular wall thickness. Right  ventricular systolic pressure is normal with an estimated pressure of 24.2  mmHg.   3. Left atrial size was moderately dilated.   4. Trivial pericardial effusion is present.   5. No evidence of mitral valve stenosis.   6. No stenosis of the aortic valve.   7. The aorta is normal unless otherwise noted.    ASSESSMENT AND PLAN:  1.  SVT: Has a documented short RP tachycardia.  He was prescribed as needed metoprolol by the emergency room physicians.  He continues to wish to avoid procedures.  [name] continue with current management.  2.  Paroxysmal atrial fibrillation: Noted on EMS records and in the emergency room on his most recent visit.  CHADS2 vascular 1.  No indication for anticoagulation at this time.  He has minimal episodes.  No changes.   Current medicines are reviewed at length with the patient today.   The patient does not have concerns regarding his medicines.  The following changes were made today:  none  Labs/ tests ordered today include:  No orders of the defined types were placed in this encounter.    Disposition:   FU 6 months  Signed, Billy Skowron Meredith Leeds, MD  08/11/2022 4:25 PM     [address] [address] [phone] (office) [phone] (fax)

## 2022-08-20 DIAGNOSIS — R7303 Prediabetes: Secondary | ICD-10-CM | POA: Diagnosis not present

## 2022-08-20 DIAGNOSIS — E782 Mixed hyperlipidemia: Secondary | ICD-10-CM | POA: Diagnosis not present

## 2022-09-18 DIAGNOSIS — D696 Thrombocytopenia, unspecified: Secondary | ICD-10-CM | POA: Diagnosis not present

## 2023-04-11 ENCOUNTER — Observation Stay (HOSPITAL_COMMUNITY)
Admission: EM | Admit: 2023-04-11 | Discharge: 2023-04-12 | Disposition: A | Discharge status: Home / Self Care | Payer: Medicare Other | Attending: Emergency Medicine | Admitting: Emergency Medicine

## 2023-04-11 ENCOUNTER — Emergency Department (HOSPITAL_COMMUNITY): Payer: Medicare Other

## 2023-04-11 ENCOUNTER — Other Ambulatory Visit: Payer: Self-pay

## 2023-04-11 DIAGNOSIS — F1010 Alcohol abuse, uncomplicated: Secondary | ICD-10-CM | POA: Diagnosis not present

## 2023-04-11 DIAGNOSIS — R001 Bradycardia, unspecified: Secondary | ICD-10-CM | POA: Diagnosis not present

## 2023-04-11 DIAGNOSIS — I700 Atherosclerosis of aorta: Secondary | ICD-10-CM | POA: Diagnosis not present

## 2023-04-11 DIAGNOSIS — M6281 Muscle weakness (generalized): Secondary | ICD-10-CM | POA: Insufficient documentation

## 2023-04-11 DIAGNOSIS — F209 Schizophrenia, unspecified: Secondary | ICD-10-CM | POA: Diagnosis present

## 2023-04-11 DIAGNOSIS — R440 Auditory hallucinations: Secondary | ICD-10-CM | POA: Insufficient documentation

## 2023-04-11 DIAGNOSIS — F203 Undifferentiated schizophrenia: Principal | ICD-10-CM

## 2023-04-11 DIAGNOSIS — R2689 Other abnormalities of gait and mobility: Secondary | ICD-10-CM | POA: Insufficient documentation

## 2023-04-11 DIAGNOSIS — R2681 Unsteadiness on feet: Secondary | ICD-10-CM | POA: Insufficient documentation

## 2023-04-11 DIAGNOSIS — R55 Syncope and collapse: Secondary | ICD-10-CM | POA: Diagnosis not present

## 2023-04-11 DIAGNOSIS — Z79899 Other long term (current) drug therapy: Secondary | ICD-10-CM | POA: Diagnosis not present

## 2023-04-11 DIAGNOSIS — R42 Dizziness and giddiness: Secondary | ICD-10-CM | POA: Diagnosis not present

## 2023-04-11 DIAGNOSIS — E785 Hyperlipidemia, unspecified: Secondary | ICD-10-CM | POA: Insufficient documentation

## 2023-04-11 NOTE — ED Triage Notes (Signed)
The pt had an episode earlier tonight of dizziness and his bp had dropped from the usual and he became diaphorectic

## 2023-04-12 ENCOUNTER — Observation Stay (HOSPITAL_BASED_OUTPATIENT_CLINIC_OR_DEPARTMENT_OTHER): Payer: Medicare Other

## 2023-04-12 ENCOUNTER — Emergency Department (HOSPITAL_COMMUNITY): Payer: Medicare Other

## 2023-04-12 DIAGNOSIS — R55 Syncope and collapse: Principal | ICD-10-CM

## 2023-04-12 DIAGNOSIS — E785 Hyperlipidemia, unspecified: Secondary | ICD-10-CM | POA: Insufficient documentation

## 2023-04-12 DIAGNOSIS — R42 Dizziness and giddiness: Secondary | ICD-10-CM | POA: Diagnosis not present

## 2023-04-12 LAB — ECHOCARDIOGRAM COMPLETE
AR max vel: 2.48 cm2
AV Area VTI: 2.62 cm2
AV Area mean vel: 2.25 cm2
AV Mean grad: 4 mm[Hg]
AV Peak grad: 6.4 mm[Hg]
Ao pk vel: 1.26 m/s
Area-P 1/2: 3.54 cm2
Height: 67 in
S' Lateral: 2.4 cm
Weight: 2592.61 [oz_av]

## 2023-04-12 LAB — RAPID URINE DRUG SCREEN, HOSP PERFORMED
Amphetamines: NOT DETECTED
Barbiturates: NOT DETECTED
Benzodiazepines: NOT DETECTED
Cocaine: NOT DETECTED
Opiates: NOT DETECTED
Tetrahydrocannabinol: NOT DETECTED

## 2023-04-12 LAB — BASIC METABOLIC PANEL
Anion gap: 12 (ref 5–15)
BUN: 7 mg/dL — ABNORMAL LOW (ref 8–23)
CO2: 25 mmol/L (ref 22–32)
Calcium: 9.1 mg/dL (ref 8.9–10.3)
Chloride: 104 mmol/L (ref 98–111)
Creatinine, Ser: 1.12 mg/dL (ref 0.61–1.24)
GFR, Estimated: >60 mL/min (ref >60)
Glucose, Bld: 134 mg/dL — ABNORMAL HIGH (ref 70–99)
Potassium: 3.5 mmol/L (ref 3.5–5.1)
Sodium: 141 mmol/L (ref 135–145)

## 2023-04-12 LAB — URINALYSIS, ROUTINE W REFLEX MICROSCOPIC
Bilirubin Urine: NEGATIVE
Glucose, UA: NEGATIVE mg/dL
Hgb urine dipstick: NEGATIVE
Ketones, ur: NEGATIVE mg/dL
Leukocytes,Ua: NEGATIVE
Nitrite: NEGATIVE
Protein, ur: NEGATIVE mg/dL
Specific Gravity, Urine: 1.004 — ABNORMAL LOW (ref 1.005–1.030)
pH: 8 (ref 5.0–8.0)

## 2023-04-12 LAB — CBC
HCT: 41.2 % (ref 39.0–52.0)
Hemoglobin: 13.8 g/dL (ref 13.0–17.0)
MCH: 33.1 pg (ref 26.0–34.0)
MCHC: 33.5 g/dL (ref 30.0–36.0)
MCV: 98.8 fL (ref 80.0–100.0)
Platelets: 152 10*3/uL (ref 150–400)
RBC: 4.17 MIL/uL — ABNORMAL LOW (ref 4.22–5.81)
RDW: 12.4 % (ref 11.5–15.5)
WBC: 4.4 10*3/uL (ref 4.0–10.5)
nRBC: 0 % (ref 0.0–0.2)

## 2023-04-12 LAB — TROPONIN I (HIGH SENSITIVITY)
Troponin I (High Sensitivity): 21 ng/L — ABNORMAL HIGH (ref <18)
Troponin I (High Sensitivity): 19 ng/L — ABNORMAL HIGH (ref <18)

## 2023-04-12 LAB — D-DIMER, QUANTITATIVE: D-Dimer, Quant: <0.27 ug{FEU}/mL (ref 0.00–0.50)

## 2023-04-12 MED ORDER — LACTATED RINGERS IV BOLUS
1000.0000 mL | Freq: Once | INTRAVENOUS | Status: AC
  Administered 2023-04-12: 1000 mL via INTRAVENOUS

## 2023-04-12 MED ORDER — ONDANSETRON HCL 4 MG/2ML IJ SOLN: 4.0000 mg | Freq: Four times a day (QID) | INTRAMUSCULAR | Status: DC | PRN

## 2023-04-12 MED ORDER — MELATONIN 5 MG PO TABS: 5.0000 mg | ORAL_TABLET | Freq: Every evening | ORAL | Status: DC | PRN

## 2023-04-12 MED ORDER — POLYETHYLENE GLYCOL 3350 17 G PO PACK: 17.0000 g | PACK | Freq: Every day | ORAL | Status: DC | PRN

## 2023-04-12 MED ORDER — THIAMINE MONONITRATE 100 MG PO TABS
100.0000 mg | ORAL_TABLET | Freq: Every day | ORAL | Status: DC
  Administered 2023-04-12: 100 mg via ORAL
  Filled 2023-04-12: qty 1

## 2023-04-12 MED ORDER — ATORVASTATIN CALCIUM 10 MG PO TABS
10.0000 mg | ORAL_TABLET | Freq: Every day | ORAL | Status: DC
  Administered 2023-04-12: 10 mg via ORAL
  Filled 2023-04-12: qty 1

## 2023-04-12 MED ORDER — ACETAMINOPHEN 650 MG RE SUPP: 650.0000 mg | Freq: Four times a day (QID) | RECTAL | Status: DC | PRN

## 2023-04-12 MED ORDER — RISPERIDONE 1 MG PO TABS
1.0000 mg | ORAL_TABLET | Freq: Two times a day (BID) | ORAL | Status: DC
  Administered 2023-04-12: 1 mg via ORAL
  Filled 2023-04-12 (×2): qty 1

## 2023-04-12 MED ORDER — FOLIC ACID 1 MG PO TABS
1.0000 mg | ORAL_TABLET | Freq: Every day | ORAL | Status: DC
  Administered 2023-04-12: 1 mg via ORAL
  Filled 2023-04-12: qty 1

## 2023-04-12 MED ORDER — ONDANSETRON HCL 4 MG PO TABS: 4.0000 mg | ORAL_TABLET | Freq: Four times a day (QID) | ORAL | Status: DC | PRN

## 2023-04-12 MED ORDER — LORAZEPAM 2 MG/ML IJ SOLN: 1.0000 mg | INTRAMUSCULAR | Status: DC | PRN

## 2023-04-12 MED ORDER — LORAZEPAM 1 MG PO TABS: 1.0000 mg | ORAL_TABLET | ORAL | Status: DC | PRN

## 2023-04-12 MED ORDER — THIAMINE HCL 100 MG/ML IJ SOLN: 100.0000 mg | Freq: Every day | INTRAMUSCULAR | Status: DC

## 2023-04-12 MED ORDER — PANTOPRAZOLE SODIUM 40 MG PO TBEC
40.0000 mg | DELAYED_RELEASE_TABLET | Freq: Every day | ORAL | Status: DC
  Administered 2023-04-12: 40 mg via ORAL
  Filled 2023-04-12: qty 1

## 2023-04-12 MED ORDER — ACETAMINOPHEN 325 MG PO TABS: 650.0000 mg | ORAL_TABLET | Freq: Four times a day (QID) | ORAL | Status: DC | PRN

## 2023-04-12 MED ORDER — ENOXAPARIN SODIUM 40 MG/0.4ML IJ SOSY
40.0000 mg | PREFILLED_SYRINGE | INTRAMUSCULAR | Status: DC
  Administered 2023-04-12: 40 mg via SUBCUTANEOUS
  Filled 2023-04-12: qty 0.4

## 2023-04-12 MED ORDER — ADULT MULTIVITAMIN W/MINERALS CH
1.0000 | ORAL_TABLET | Freq: Every day | ORAL | Status: DC
  Administered 2023-04-12: 1 via ORAL
  Filled 2023-04-12: qty 1

## 2023-04-12 NOTE — ED Notes (Signed)
NP at bedside.

## 2023-04-12 NOTE — H&P (Addendum)
History and Physical    Patient: Billy Boyd MVH:[id number] DOB: Mar 30, 1956 DOA: 04/11/2023 DOS: the patient was seen and examined on 04/12/2023 PCP: Georgann Housekeeper, MD  Patient coming from: Home  Chief Complaint:  Chief Complaint  Patient presents with   Dizziness   Near Syncope   HPI: Billy Boyd is a 67 y.o. male with medical history significant of  SVT, schizophrenia, syncope, hyperlipidemia, BPH, alcohol abuse presenting with episode of near syncope.  He presents to [facility] emergency department after sister returned home around 7 PM and noticed that he was not eating dinner which is abnormal for him.  He was in his room and had an episode of feeling as though he was going to fall.  Patient does recall talking to his sister and then reports he fell backwards onto his buttocks no head strike, no loss consciousness.  Sister reports he appeared diaphoretic.  Patient denies any preceding symptoms. He denies dizziness, sensation of room spinning, lightheadedness, palpitations, chest pain, shortness of breath.  He also denies any numbness tingling or unilateral weakness.  No focal neurological deficits on exam.  Patient does report he has not eaten well over the last several days, on interview patient denies that this is due to any abdominal symptoms, nausea, vomiting.  During the interview patient endorses active chronic auditory hallucinations, he reports there are ~ 5-6 voices that mostly curse at him, no command hallucinations.  States that the voices are bothersome and annoying but do not scare him.   ED Course: On arrival to the ED he was noted to be afebrile temp 37.4C, BP 114/78, HR 61, RR 16, SpO2 100% on room air.  He has had intermittent bradycardia with heart rate as low as 49 though hemodynamically stable throughout the ED stay.  Labs notable for troponin mildly elevated at 21 with a 2-hr repeat of 19 and negative D-dimer. Other lab work unremarkable.  CT head obtained and negative  for acute intracranial abnormality, Chest x-ray also negative.  He was given a 1 L LR bolus and TRH consulted for admission.  Review of Systems: As mentioned in the history of present illness. All other systems reviewed and are negative. Past Medical History:  Diagnosis Date   Anemia    Benign localized prostatic hyperplasia with lower urinary tract symptoms (LUTS)    History of alcohol abuse    History of chest pain    ED visit 01-17-2016 dx unspecified chest pain, GERD, hyponatremia   History of supraventricular tachycardia    ED visit 12-15-2015;  per d/c note by dr Johney Frame (cardiology) to follow up as outpt for ETT and Echo   History of syncope    ED visit 01-27-2017 w/ LOC-- situational (bowel movement) and vasovagal   Schizophrenia, chronic condition (HCC)    Past Surgical History:  Procedure Laterality Date   TRANSURETHRAL RESECTION OF PROSTATE N/A 03/03/2019   Procedure: TRANSURETHRAL RESECTION OF THE PROSTATE (TURP);  Surgeon: Malen Gauze, MD;  Location: [facility];  Service: Urology;  Laterality: N/A;   Social History:  reports that he has never smoked. He has never used smokeless tobacco. He reports that he does not drink alcohol and does not use drugs.  No Known Allergies  Family History  Problem Relation Age of Onset   Heart disease Mother        Pacemaker    Prior to Admission medications   Medication Sig Start Date End Date Taking? Authorizing Provider  atorvastatin (LIPITOR) 10  MG tablet Take 10 mg by mouth daily. 06/05/20   [provider]  cyanocobalamin 100 MCG tablet Take 100 mcg by mouth daily.    [provider]  metoprolol tartrate (LOPRESSOR) 25 MG tablet Take 1 tablet (25 mg total) by mouth as needed (palpitations/high heart rate). 08/08/22   Pricilla Loveless, MD  Multiple Vitamin (MULTIVITAMIN ADULT) TABS as directed Orally    [provider]  risperiDONE (RISPERDAL) 1 MG tablet Take 1 mg by mouth 2 (two) times  daily.     [provider]    Physical Exam: Vitals:   04/11/23 2305 04/12/23 0355 04/12/23 0615 04/12/23 0630  BP:  117/74 114/77   Pulse:  (!) 51    Resp:  17  (!) 22  Temp:  98.7 F (37.1 C)    TempSrc:  Oral    SpO2:  100%    Weight: 73.5 kg     Height: 5\' 7"  (1.702 m)      Constitutional: NAD, calm, comfortable Eyes: PERRL, lids and conjunctivae normal ENMT: Mucous membranes are moist. Posterior pharynx clear of any exudate or lesions. Neck: normal, supple, no masses, no thyromegaly Respiratory: clear to auscultation bilaterally, no wheezing, no crackles. Normal respiratory effort. No accessory muscle use.  Cardiovascular: Regular rate and rhythm, no murmurs / rubs / gallops. No extremity edema. 2+ radial and pedal pulses.   Abdomen: no tenderness, no masses palpated. No hepatosplenomegaly. Bowel sounds positive.  Musculoskeletal: no clubbing / cyanosis. No joint deformity upper and lower extremities. Good ROM, no contractures. Normal muscle tone.  Skin: no rashes, lesions, ulcers.  Neurologic: CN 2-12 grossly intact.  Alert and oriented x 3. Positive for auditory hallucinations  Data Reviewed: CBC    Component Value Date/Time   WBC 4.4 04/11/2023 2327   RBC 4.17 (L) 04/11/2023 2327   HGB 13.8 04/11/2023 2327   HCT 41.2 04/11/2023 2327   HCT 25.7 (L) 03/06/2019 1949   PLT 152 04/11/2023 2327   MCV 98.8 04/11/2023 2327   MCH 33.1 04/11/2023 2327   MCHC 33.5 04/11/2023 2327   RDW 12.4 04/11/2023 2327   LYMPHSABS 1.6 08/08/2022 1053   MONOABS 0.3 08/08/2022 1053   EOSABS 0.0 08/08/2022 1053   BASOSABS 0.0 08/08/2022 1053      Latest Ref Rng & Units 04/11/2023   11:27 PM 08/08/2022   10:53 AM 03/08/2019    4:31 AM  BMP  Glucose 70 - 99 mg/dL 161  096  045   BUN 8 - 23 mg/dL 7  18  10    Creatinine 0.61 - 1.24 mg/dL 4.09  8.11  9.14   Sodium 135 - 145 mmol/L 141  142  139   Potassium 3.5 - 5.1 mmol/L 3.5  3.7  3.9   Chloride 98 - 111 mmol/L 104  104  106    CO2 22 - 32 mmol/L 25  29  27    Calcium 8.9 - 10.3 mg/dL 9.1  9.2  8.4     Cardiac Panel (last 3 results) Recent Labs    04/11/23 2327 04/12/23 0112  TROPONINIHS 21* 19*    DDIMER    Component Value Date/Time   Ddimer, quant <0.27 04/12/2023 0112       Assessment and Plan: #Near Syncope #Fall - Orthostatic vital signs  - ECHO - Cardiac Monitoring - Check Troponin: 21-->19 - CT Head: Negative  - Neuro checks - Encourage PO hydration in setting of nationwide IVF shortage - PT/OT eval and treat -  Check Urinalysis/UDS - Bradycardic in ED, will consider Cardiology consultation after ECHO results.  #Schizophrenia #Active Chronic Auditory Hallucinations - Continue home Risperdal   #Hyperlipidemia -Continue home Atorvastatin  #Alcohol Use Disorder -Check ETOH level -CIWA protocol  VTE prophylaxis: Lovenox  GI prophylaxis:Protonix Diet: Heart Healthy Access: PIV Lines: NONE Code Status: FULL Telemetry: Yes Disposition: Observe on Med-Surg Outside Records Reviewed: 08/21/22 Deboraha Sprang IM note  Advance Care Planning: Code Status: FULL  Consults: Psych  Family Communication: Call placed to sister Junious Dresser, no answer.  Severity of Illness: The appropriate patient status for this patient is OBSERVATION. Observation status is judged to be reasonable and necessary in order to provide the required intensity of service to ensure the patient's safety. The patient's presenting symptoms, physical exam findings, and initial radiographic and laboratory data in the context of their medical condition is felt to place them at decreased risk for further clinical deterioration. Furthermore, it is anticipated that the patient will be medically stable for discharge from the hospital within 2 midnights of admission.   To reach the provider On-Call:   7AM- 7PM see care teams to locate the attending and reach out to them via [email or web address] Password: [email or web address] 7PM-7AM contact  night-coverage If you still have difficulty reaching the appropriate provider, please page the [facility] (Director on Call) for [facility] on amion for assistance  This document was prepared using Conservation officer, historic buildings and may include unintentional dictation errors.  Bishop Limbo FNP-BC, PMHNP-BC Nurse Practitioner [facility]

## 2023-04-12 NOTE — Evaluation (Addendum)
Occupational Therapy Evaluation Patient Details Name: Billy Boyd MRN: [id number] DOB: [birth year] Today's Date: 04/12/2023   History of Present Illness Pt is a 68 y/o M presenting to ED on 10/12 with dizziness and near syncopal episode, fell  with no LOC or head strike. PMH includes SVT, schizphrenia, BPH, alcohol abuse   Clinical Impression   Pt reports ind at baseline with ADLs/functional mobility, lives with sister and her husband who can assist at d/c. Pt set up - min A for ADLs, supervision for bed mobility and CGA for transfers without AD. Pt with decr cognition, oriented to self, place, cues to recall date/time and following commands with incr time. Pt with no dizziness/orthostatic symptoms during session (see BP measures below). Pt presenting with impairments listed below, will follow acutely to maximize safety/ind with ADL/s functional mobility.  BP supine 118/61 (79) BP seated 133/77 (93) BP standing 107/71 (83) BP post ambulation to bathroom 125/73 (90)      If plan is discharge home, recommend the following: Direct supervision/assist for medications management;Direct supervision/assist for financial management;Supervision due to cognitive status    Functional Status Assessment  Patient has had a recent decline in their functional status and demonstrates the ability to make significant improvements in function in a reasonable and predictable amount of time.  Equipment Recommendations  None recommended by OT    Recommendations for Other Services PT consult     Precautions / Restrictions Precautions Precautions: Fall Restrictions Weight Bearing Restrictions: No      Mobility Bed Mobility Overal bed mobility: Needs Assistance Bed Mobility: Supine to Sit, Sit to Supine     Supine to sit: Supervision Sit to supine: Supervision        Transfers Overall transfer level: Needs assistance Equipment used: None Transfers: Sit to/from Stand Sit to Stand: Contact  guard assist                  Balance Overall balance assessment: Mild deficits observed, not formally tested                                         ADL either performed or assessed with clinical judgement   ADL Overall ADL's : Needs assistance/impaired Eating/Feeding: Set up   Grooming: Supervision/safety;Standing Grooming Details (indicate cue type and reason): washing hands at sink Upper Body Bathing: Minimal assistance   Lower Body Bathing: Minimal assistance   Upper Body Dressing : Supervision/safety   Lower Body Dressing: Supervision/safety Lower Body Dressing Details (indicate cue type and reason): donning/doffing socks Toilet Transfer: Contact guard assist;Ambulation;Regular Toilet                   Vision   Vision Assessment?: No apparent visual deficits Additional Comments: reads clock and read sign on wall     Perception Perception: Not tested       Praxis Praxis: Not tested       Pertinent Vitals/Pain Pain Assessment Pain Assessment: No/denies pain     Extremity/Trunk Assessment Upper Extremity Assessment Upper Extremity Assessment: Generalized weakness   Lower Extremity Assessment Lower Extremity Assessment: Defer to PT evaluation   Cervical / Trunk Assessment Cervical / Trunk Assessment: Normal   Communication Communication Communication: No apparent difficulties   Cognition Arousal: Alert Behavior During Therapy: Flat affect Overall Cognitive Status: No family/caregiver present to determine baseline cognitive functioning  General Comments: pt oriented to self and "hospital" able tos tate DOB but does not recall date, recalls month when given 2 options of January/ October     General Comments  not orthostatic, see note for BP measures    Exercises     Shoulder Instructions      Home Living Family/patient expects to be discharged to:: Private  residence Living Arrangements: Other relatives (sister and sister's husband) Available Help at Discharge: Family;Available PRN/intermittently Type of Home: House Home Access: Stairs to enter Entrance Stairs-Number of Steps: 4   Home Layout: Two level;Bed/bath upstairs Alternate Level Stairs-Number of Steps: flight   Bathroom Shower/Tub: Tub/shower unit         Home Equipment: None          Prior Functioning/Environment Prior Level of Function : Independent/Modified Independent;Working/employed             Mobility Comments: no AD use ADLs Comments: works at his [employer], ind with ADLs, sister occasionally assists with meds, does not drive        OT Problem List: Decreased strength;Decreased range of motion;Decreased activity tolerance;Decreased cognition;Decreased safety awareness      OT Treatment/Interventions: Self-care/ADL training;Therapeutic exercise;Energy conservation;DME and/or AE instruction;Cognitive remediation/compensation;Patient/family education;Balance training;Therapeutic activities    OT Goals(Current goals can be found in the care plan section) Acute Rehab OT Goals Patient Stated Goal: none stated OT Goal Formulation: With patient Time For Goal Achievement: 04/26/23 Potential to Achieve Goals: Good  OT Frequency: Min 1X/week    Co-evaluation              AM-PAC OT "6 Clicks" Daily Activity     Outcome Measure Help from another person eating meals?: A Little Help from another person taking care of personal grooming?: A Little Help from another person toileting, which includes using toliet, bedpan, or urinal?: A Little Help from another person bathing (including washing, rinsing, drying)?: A Little Help from another person to put on and taking off regular upper body clothing?: A Little Help from another person to put on and taking off regular lower body clothing?: A Little 6 Click Score: 18   End of Session Equipment Utilized  During Treatment: Gait belt Nurse Communication: Mobility status  Activity Tolerance: Patient tolerated treatment well Patient left: in bed;with call bell/phone within reach  OT Visit Diagnosis: Unsteadiness on feet (R26.81);Other abnormalities of gait and mobility (R26.89);Muscle weakness (generalized) (M62.81)                Time: 6213-0865 OT Time Calculation (min): 19 min Charges:  OT General Charges $OT Visit: 1 Visit OT Evaluation $OT Eval Low Complexity: 1 Low  Sharissa Brierley K, OTD, OTR/L SecureChat Preferred Acute Rehab [phone]   Maida Widger K Koonce 04/12/2023, 2:20 PM

## 2023-04-12 NOTE — Evaluation (Addendum)
Physical Therapy Evaluation Patient Details Name: Billy Boyd MRN: [id number] DOB: [birth year] Today's Date: 04/12/2023  History of Present Illness  Pt is a 67 y/o M presenting to ED on 10/12 with dizziness and near syncopal episode, fell  with no LOC or head strike. PMH includes SVT, schizphrenia, BPH, alcohol abuse  Clinical Impression  Patient presents with mobility likely close to his baseline.  Able to ambulate long distance in hallway to stairwell and negotiated flight of stairs with railing and S only for safety.  Patient did note some stiffness in his joints and had some increasing difficulty descending steps due to R ankle stiffness, though utilized rails and compensatory technique safely.  Patient stable from mobility standpoint for d/c home with intermittent family support.  PT will sign off.     BP pre ambulation 118/77; post ambulation 117/75    If plan is discharge home, recommend the following: Supervision due to cognitive status;Help with stairs or ramp for entrance   Can travel by private vehicle        Equipment Recommendations None recommended by PT  Recommendations for Other Services       Functional Status Assessment Patient has had a recent decline in their functional status and demonstrates the ability to make significant improvements in function in a reasonable and predictable amount of time.     Precautions / Restrictions Precautions Precautions: None Restrictions Weight Bearing Restrictions: No      Mobility  Bed Mobility         Supine to sit: Modified independent (Device/Increase time) Sit to supine: Modified independent (Device/Increase time)        Transfers Overall transfer level: Needs assistance Equipment used: None Transfers: Sit to/from Stand Sit to Stand: Supervision           General transfer comment: assist for safety/lines    Ambulation/Gait Ambulation/Gait assistance: Independent Gait Distance (Feet): 300  Feet Assistive device: None Gait Pattern/deviations: Step-through pattern, Decreased stride length, Antalgic       General Gait Details: c/o joint stiffness esp R ankle  Stairs Stairs: Yes Stairs assistance: Supervision Stair Management: Alternating pattern, One rail Left, Two rails Number of Stairs: 16 General stair comments: step through technique using one versus two rails noted R knee buckling on descent due to pain in ankle  Wheelchair Mobility     Tilt Bed    Modified Rankin (Stroke Patients Only)       Balance Overall balance assessment: No apparent balance deficits (not formally assessed)                                           Pertinent Vitals/Pain Pain Assessment Pain Assessment: Faces Faces Pain Scale: Hurts a little bit Pain Location: stiff ankles Pain Descriptors / Indicators: Discomfort, Grimacing Pain Intervention(s): Monitored during session, Repositioned    Home Living Family/patient expects to be discharged to:: Private residence Living Arrangements: Other relatives (sister and sister's spouse) Available Help at Discharge: Family;Available PRN/intermittently Type of Home: House Home Access: Stairs to enter   Entrance Stairs-Number of Steps: 4 Alternate Level Stairs-Number of Steps: flight Home Layout: Two level;Bed/bath upstairs Home Equipment: None      Prior Function Prior Level of Function : Independent/Modified Independent;Working/employed             Mobility Comments: no AD use ADLs Comments: works at his [employer], ind with ADLs,  sister occasionally assists with meds, does not drive     Extremity/Trunk Assessment   Upper Extremity Assessment Upper Extremity Assessment: Defer to OT evaluation    Lower Extremity Assessment Lower Extremity Assessment: Overall WFL for tasks assessed    Cervical / Trunk Assessment Cervical / Trunk Assessment: Normal  Communication   Communication Communication: No  apparent difficulties  Cognition Arousal: Alert Behavior During Therapy: Flat affect Overall Cognitive Status: No family/caregiver present to determine baseline cognitive functioning                                 General Comments: likely at his baseline        General Comments General comments (skin integrity, edema, etc.): not orthostatic, see note for BP measures    Exercises     Assessment/Plan    PT Assessment Patient does not need any further PT services  PT Problem List         PT Treatment Interventions      PT Goals (Current goals can be found in the Care Plan section)  Acute Rehab PT Goals PT Goal Formulation: All assessment and education complete, DC therapy    Frequency       Co-evaluation               AM-PAC PT "6 Clicks" Mobility  Outcome Measure Help needed turning from your back to your side while in a flat bed without using bedrails?: None Help needed moving from lying on your back to sitting on the side of a flat bed without using bedrails?: None Help needed moving to and from a bed to a chair (including a wheelchair)?: None Help needed standing up from a chair using your arms (e.g., wheelchair or bedside chair)?: None Help needed to walk in hospital room?: None Help needed climbing 3-5 steps with a railing? : A Little 6 Click Score: 23    End of Session Equipment Utilized During Treatment: Gait belt Activity Tolerance: Patient tolerated treatment well Patient left: in bed;with call bell/phone within reach   PT Visit Diagnosis: Muscle weakness (generalized) (M62.81)    Time: 1610-9604 PT Time Calculation (min) (ACUTE ONLY): 18 min   Charges:   PT Evaluation $PT Eval Low Complexity: 1 Low   PT General Charges $$ ACUTE PT VISIT: 1 Visit         Sheran Lawless, PT Acute Rehabilitation Services Office:[phone] 04/12/2023   Elray Mcgregor 04/12/2023, 4:05 PM

## 2023-04-12 NOTE — ED Notes (Signed)
All pt fall risk precautions are in place

## 2023-04-12 NOTE — Discharge Summary (Signed)
Physician Discharge Summary   Patient: Billy Boyd MRN: [id number] DOB: 1955/11/28  Admit date:     04/11/2023  Discharge date: 04/12/23  Discharge Physician: Briant Cedar   PCP: Georgann Housekeeper, MD   Recommendations at discharge:   Follow-up with PCP as scheduled Outpatient psych follow-up   Discharge Diagnoses: Principal Problem:   Near syncope Active Problems:   Schizophrenia [facility])   Hyperlipidemia    Hospital Course: Briefly 67 year old male with history of schizophrenia, SVT, BPH presents to the ED due to possible near syncope. Upon investigation/discussing with sisters at bedside, patient has not been eating/drinking well taking his medication regularly. Patient lives with his sister. Patient just reports he does not feel hungry and denies any dizziness, chest pain, shortness of breath, nausea/vomiting, fever/chills. Patient reported falling backwards onto his buttocks, and did not hit his head and no loss of consciousness. Patient does report active chronic auditory hallucinations, which doesn't seem to be bothersome. Sister reports he has a ton of books at home of which he writes all over the pages, across the pages, and feels people are trying to take away his thoughts. In the ED, vital signs fairly stable, with heart rate ranging mostly in the 60s, and SBP mostly in the 100s. Labs showed minimal elevated troponin with a flat trend, D-dimer negative, CT head, chest x-ray all negative. Echo with normal EF and no other abnormality. PT/OT evaluated patient, noted to be orthostatic negative, with no further recommendations. UA appears to be negative, urine drug screen negative. No abnormal rhythm on telemetry. Ambulated without any dizziness or any new complaints.     Assessment and Plan:  #Near Syncope #Fall Orthostatic vital signs-negative ECHO unremarkable Troponin: 21-->19, flat trend, completely chest pain-free EKG with sinus bradycardia CT Head: Negative  UA,  UDS unremarkable Telemetry with no obvious event    #Schizophrenia #Chronic Auditory Hallucinations Continue home Risperdal, not compliant Recommend outpatient psychiatry evaluation and management   #Hyperlipidemia Continue home Atorvastatin    I discussed extensively with patient's sisters at bedside, all medical workup appeared to be negative. Advised to follow-up with patient's PCP (who has been also managing his psychiatry medication/issues) for possible outpatient referral to psychiatry. Patient has an appointment with PCP on 04/17/2023. Patient needs outpatient psychiatry evaluation and management. Patient denies any suicidal/homicidal ideation. Patient has been pleasant and calm the entire hospital stay. Sisters denied any form of agitation or restlessness or any violence. Reports he mostly spends time in bed sleeping or writing in his books. Advised family to bring him back to the ED if any syncopal event occurs or any other emergency.       Consultants: None Procedures performed: None Disposition: Home Diet recommendation:  Regular diet    DISCHARGE MEDICATION: Allergies as of 04/12/2023   No Known Allergies      Medication List     TAKE these medications    atorvastatin 10 MG tablet Commonly known as: LIPITOR Take 10 mg by mouth daily.   cyanocobalamin 100 MCG tablet Take 100 mcg by mouth daily.   ferrous sulfate 325 (65 FE) MG EC tablet Take 325 mg by mouth daily with breakfast.   FISH OIL PO Take 1 tablet by mouth daily.   Multivitamin Adult Tabs Take 1 tablet by mouth daily.   risperiDONE 1 MG tablet Commonly known as: RISPERDAL Take 1 mg by mouth 2 (two) times daily.        Follow-up Information     Georgann Housekeeper, MD. Schedule  an appointment as soon as possible for a visit in 1 week(s).   Specialty: Internal Medicine Contact information: [address] [place] [address] [phone]                 Discharge Exam: Ceasar Mons Weights   04/11/23 2305  Weight: 73.5 kg   General: NAD, oral tardive dyskinesia  Cardiovascular: S1, S2 present Respiratory: CTAB Abdomen: Soft, nontender, nondistended, bowel sounds present Musculoskeletal: No bilateral pedal edema noted Skin: Normal Psychiatry: Normal mood   Condition at discharge: stable  The results of significant diagnostics from this hospitalization (including imaging, microbiology, ancillary and laboratory) are listed below for reference.   Imaging Studies: ECHOCARDIOGRAM COMPLETE  Result Date: 04/12/2023    ECHOCARDIOGRAM REPORT   Patient Name:   Billy Boyd Date of Exam: 04/12/2023 Medical Rec #:  [id number]     Height:       67.0 in Accession #:    [id number]    Weight:       162.0 lb Date of Birth:  Apr 15, 1956     BSA:          1.849 m Patient Age:    67 years      BP:           130/80 mmHg Patient Gender: M             HR:           49 bpm. Exam Location:  Inpatient Procedure: 2D Echo, Color Doppler and Cardiac Doppler Indications:    Syncope  History:        Patient has prior history of Echocardiogram examinations, most                 recent 03/07/2019. Arrythmias:SVT; Signs/Symptoms:Syncope.  Sonographer:    Milbert Coulter Referring Phys: Lanney Gins FOUST IMPRESSIONS  1. Left ventricular ejection fraction, by estimation, is 60 to 65%. The left ventricle has normal function. The left ventricle has no regional wall motion abnormalities. Left ventricular diastolic parameters were normal.  2. Right ventricular systolic function is normal. The right ventricular size is normal.  3. The mitral valve is normal in structure. No evidence of mitral valve regurgitation. No evidence of mitral stenosis.  4. The aortic valve is tricuspid. Aortic valve regurgitation is not visualized. Aortic valve sclerosis is present, with no evidence of aortic valve stenosis. Comparison(s): A prior study was performed on 03/07/2019. LVEF remains preserved, LA size is now  normal, and no pericardial effusion on current study. FINDINGS  Left Ventricle: Left ventricular ejection fraction, by estimation, is 60 to 65%. The left ventricle has normal function. The left ventricle has no regional wall motion abnormalities. The left ventricular internal cavity size was normal in size. There is  no left ventricular hypertrophy. Left ventricular diastolic parameters were normal. Right Ventricle: The right ventricular size is normal. No increase in right ventricular wall thickness. Right ventricular systolic function is normal. Left Atrium: Left atrial size was normal in size. Right Atrium: Right atrial size was normal in size. Pericardium: There is no evidence of pericardial effusion. Mitral Valve: The mitral valve is normal in structure. No evidence of mitral valve regurgitation. No evidence of mitral valve stenosis. Tricuspid Valve: The tricuspid valve is normal in structure. Tricuspid valve regurgitation is mild . No evidence of tricuspid stenosis. Aortic Valve: The aortic valve is tricuspid. Aortic valve regurgitation is not visualized. Aortic valve sclerosis is present, with no evidence of aortic valve stenosis.  Physician Discharge Summary   Patient: Billy Boyd MRN: [id number] DOB: 1955/11/28  Admit date:     04/11/2023  Discharge date: 04/12/23  Discharge Physician: Briant Cedar   PCP: Georgann Housekeeper, MD   Recommendations at discharge:   Follow-up with PCP as scheduled Outpatient psych follow-up   Discharge Diagnoses: Principal Problem:   Near syncope Active Problems:   Schizophrenia [facility])   Hyperlipidemia    Hospital Course: Briefly 67 year old male with history of schizophrenia, SVT, BPH presents to the ED due to possible near syncope. Upon investigation/discussing with sisters at bedside, patient has not been eating/drinking well taking his medication regularly. Patient lives with his sister. Patient just reports he does not feel hungry and denies any dizziness, chest pain, shortness of breath, nausea/vomiting, fever/chills. Patient reported falling backwards onto his buttocks, and did not hit his head and no loss of consciousness. Patient does report active chronic auditory hallucinations, which doesn't seem to be bothersome. Sister reports he has a ton of books at home of which he writes all over the pages, across the pages, and feels people are trying to take away his thoughts. In the ED, vital signs fairly stable, with heart rate ranging mostly in the 60s, and SBP mostly in the 100s. Labs showed minimal elevated troponin with a flat trend, D-dimer negative, CT head, chest x-ray all negative. Echo with normal EF and no other abnormality. PT/OT evaluated patient, noted to be orthostatic negative, with no further recommendations. UA appears to be negative, urine drug screen negative. No abnormal rhythm on telemetry. Ambulated without any dizziness or any new complaints.     Assessment and Plan:  #Near Syncope #Fall Orthostatic vital signs-negative ECHO unremarkable Troponin: 21-->19, flat trend, completely chest pain-free EKG with sinus bradycardia CT Head: Negative  UA,  UDS unremarkable Telemetry with no obvious event    #Schizophrenia #Chronic Auditory Hallucinations Continue home Risperdal, not compliant Recommend outpatient psychiatry evaluation and management   #Hyperlipidemia Continue home Atorvastatin    I discussed extensively with patient's sisters at bedside, all medical workup appeared to be negative. Advised to follow-up with patient's PCP (who has been also managing his psychiatry medication/issues) for possible outpatient referral to psychiatry. Patient has an appointment with PCP on 04/17/2023. Patient needs outpatient psychiatry evaluation and management. Patient denies any suicidal/homicidal ideation. Patient has been pleasant and calm the entire hospital stay. Sisters denied any form of agitation or restlessness or any violence. Reports he mostly spends time in bed sleeping or writing in his books. Advised family to bring him back to the ED if any syncopal event occurs or any other emergency.       Consultants: None Procedures performed: None Disposition: Home Diet recommendation:  Regular diet    DISCHARGE MEDICATION: Allergies as of 04/12/2023   No Known Allergies      Medication List     TAKE these medications    atorvastatin 10 MG tablet Commonly known as: LIPITOR Take 10 mg by mouth daily.   cyanocobalamin 100 MCG tablet Take 100 mcg by mouth daily.   ferrous sulfate 325 (65 FE) MG EC tablet Take 325 mg by mouth daily with breakfast.   FISH OIL PO Take 1 tablet by mouth daily.   Multivitamin Adult Tabs Take 1 tablet by mouth daily.   risperiDONE 1 MG tablet Commonly known as: RISPERDAL Take 1 mg by mouth 2 (two) times daily.        Follow-up Information     Georgann Housekeeper, MD. Schedule  an appointment as soon as possible for a visit in 1 week(s).   Specialty: Internal Medicine Contact information: [address] [place] [address] [phone]                 Discharge Exam: Ceasar Mons Weights   04/11/23 2305  Weight: 73.5 kg   General: NAD, oral tardive dyskinesia  Cardiovascular: S1, S2 present Respiratory: CTAB Abdomen: Soft, nontender, nondistended, bowel sounds present Musculoskeletal: No bilateral pedal edema noted Skin: Normal Psychiatry: Normal mood   Condition at discharge: stable  The results of significant diagnostics from this hospitalization (including imaging, microbiology, ancillary and laboratory) are listed below for reference.   Imaging Studies: ECHOCARDIOGRAM COMPLETE  Result Date: 04/12/2023    ECHOCARDIOGRAM REPORT   Patient Name:   Billy Boyd Date of Exam: 04/12/2023 Medical Rec #:  [id number]     Height:       67.0 in Accession #:    [id number]    Weight:       162.0 lb Date of Birth:  Apr 15, 1956     BSA:          1.849 m Patient Age:    67 years      BP:           130/80 mmHg Patient Gender: M             HR:           49 bpm. Exam Location:  Inpatient Procedure: 2D Echo, Color Doppler and Cardiac Doppler Indications:    Syncope  History:        Patient has prior history of Echocardiogram examinations, most                 recent 03/07/2019. Arrythmias:SVT; Signs/Symptoms:Syncope.  Sonographer:    Milbert Coulter Referring Phys: Lanney Gins FOUST IMPRESSIONS  1. Left ventricular ejection fraction, by estimation, is 60 to 65%. The left ventricle has normal function. The left ventricle has no regional wall motion abnormalities. Left ventricular diastolic parameters were normal.  2. Right ventricular systolic function is normal. The right ventricular size is normal.  3. The mitral valve is normal in structure. No evidence of mitral valve regurgitation. No evidence of mitral stenosis.  4. The aortic valve is tricuspid. Aortic valve regurgitation is not visualized. Aortic valve sclerosis is present, with no evidence of aortic valve stenosis. Comparison(s): A prior study was performed on 03/07/2019. LVEF remains preserved, LA size is now  normal, and no pericardial effusion on current study. FINDINGS  Left Ventricle: Left ventricular ejection fraction, by estimation, is 60 to 65%. The left ventricle has normal function. The left ventricle has no regional wall motion abnormalities. The left ventricular internal cavity size was normal in size. There is  no left ventricular hypertrophy. Left ventricular diastolic parameters were normal. Right Ventricle: The right ventricular size is normal. No increase in right ventricular wall thickness. Right ventricular systolic function is normal. Left Atrium: Left atrial size was normal in size. Right Atrium: Right atrial size was normal in size. Pericardium: There is no evidence of pericardial effusion. Mitral Valve: The mitral valve is normal in structure. No evidence of mitral valve regurgitation. No evidence of mitral valve stenosis. Tricuspid Valve: The tricuspid valve is normal in structure. Tricuspid valve regurgitation is mild . No evidence of tricuspid stenosis. Aortic Valve: The aortic valve is tricuspid. Aortic valve regurgitation is not visualized. Aortic valve sclerosis is present, with no evidence of aortic valve stenosis.  an appointment as soon as possible for a visit in 1 week(s).   Specialty: Internal Medicine Contact information: [address] [place] [address] [phone]                 Discharge Exam: Ceasar Mons Weights   04/11/23 2305  Weight: 73.5 kg   General: NAD, oral tardive dyskinesia  Cardiovascular: S1, S2 present Respiratory: CTAB Abdomen: Soft, nontender, nondistended, bowel sounds present Musculoskeletal: No bilateral pedal edema noted Skin: Normal Psychiatry: Normal mood   Condition at discharge: stable  The results of significant diagnostics from this hospitalization (including imaging, microbiology, ancillary and laboratory) are listed below for reference.   Imaging Studies: ECHOCARDIOGRAM COMPLETE  Result Date: 04/12/2023    ECHOCARDIOGRAM REPORT   Patient Name:   Billy Boyd Date of Exam: 04/12/2023 Medical Rec #:  [id number]     Height:       67.0 in Accession #:    [id number]    Weight:       162.0 lb Date of Birth:  Apr 15, 1956     BSA:          1.849 m Patient Age:    67 years      BP:           130/80 mmHg Patient Gender: M             HR:           49 bpm. Exam Location:  Inpatient Procedure: 2D Echo, Color Doppler and Cardiac Doppler Indications:    Syncope  History:        Patient has prior history of Echocardiogram examinations, most                 recent 03/07/2019. Arrythmias:SVT; Signs/Symptoms:Syncope.  Sonographer:    Milbert Coulter Referring Phys: Lanney Gins FOUST IMPRESSIONS  1. Left ventricular ejection fraction, by estimation, is 60 to 65%. The left ventricle has normal function. The left ventricle has no regional wall motion abnormalities. Left ventricular diastolic parameters were normal.  2. Right ventricular systolic function is normal. The right ventricular size is normal.  3. The mitral valve is normal in structure. No evidence of mitral valve regurgitation. No evidence of mitral stenosis.  4. The aortic valve is tricuspid. Aortic valve regurgitation is not visualized. Aortic valve sclerosis is present, with no evidence of aortic valve stenosis. Comparison(s): A prior study was performed on 03/07/2019. LVEF remains preserved, LA size is now  normal, and no pericardial effusion on current study. FINDINGS  Left Ventricle: Left ventricular ejection fraction, by estimation, is 60 to 65%. The left ventricle has normal function. The left ventricle has no regional wall motion abnormalities. The left ventricular internal cavity size was normal in size. There is  no left ventricular hypertrophy. Left ventricular diastolic parameters were normal. Right Ventricle: The right ventricular size is normal. No increase in right ventricular wall thickness. Right ventricular systolic function is normal. Left Atrium: Left atrial size was normal in size. Right Atrium: Right atrial size was normal in size. Pericardium: There is no evidence of pericardial effusion. Mitral Valve: The mitral valve is normal in structure. No evidence of mitral valve regurgitation. No evidence of mitral valve stenosis. Tricuspid Valve: The tricuspid valve is normal in structure. Tricuspid valve regurgitation is mild . No evidence of tricuspid stenosis. Aortic Valve: The aortic valve is tricuspid. Aortic valve regurgitation is not visualized. Aortic valve sclerosis is present, with no evidence of aortic valve stenosis.

## 2023-04-12 NOTE — ED Notes (Signed)
Per Briant Cedar, MD hold patient in the ER. Monitor BP for 1 hour.

## 2023-04-12 NOTE — ED Notes (Signed)
Pt given po fluids per order and has tolerated well.

## 2023-04-12 NOTE — ED Provider Notes (Signed)
[place] EMERGENCY DEPARTMENT AT [facility] Provider Note   CSN: [id number] Arrival date & time: 04/11/23  2229     History  Chief Complaint  Patient presents with   Dizziness   Near Syncope    Billy Boyd is a 67 y.o. male.  Patient with a history of SVT, schizophrenia, syncope, BPH, alcohol abuse presenting with episode of near syncope.  Family at bedside contributes to the history.  Patient's sister returned home and noticed that patient not eating dinner around 7 PM.  Patient was in his room and had an episode of feeling like he was going to fall or pass out.  He remembers talking to his sister and then fell backwards onto his buttocks.  Did not hit his head or lose consciousness.  Sister reports he was diaphoretic.  He denies any preceding room spinning dizziness or lightheadedness.  States he did not feel lightheaded but "felt like I was going to fall".  States he feels fine now.  Denies any headache, vision changes, chest pain, shortness of breath, unilateral weakness, numbness, tingling, difficulty breathing or difficulty swallowing.No fever.  Admits to not eating very well the past several days.  Has passed out in the past.  No history of diabetes.  He denies any room spinning dizziness.  Denies feeling lightheaded but states "I felt like he was going to fall".  Denies any headache, chest pain, abdominal pain or back pain.  No visual changes.  The history is provided by the patient and a relative.  Dizziness Associated symptoms: no chest pain, no headaches, no nausea, no shortness of breath and no vomiting   Near Syncope Pertinent negatives include no chest pain, no abdominal pain, no headaches and no shortness of breath.       Home Medications Prior to Admission medications   Medication Sig Start Date End Date Taking? Authorizing Provider  atorvastatin (LIPITOR) 10 MG tablet Take 10 mg by mouth daily. 06/05/20   [provider]  cyanocobalamin 100  MCG tablet Take 100 mcg by mouth daily.    [provider]  metoprolol tartrate (LOPRESSOR) 25 MG tablet Take 1 tablet (25 mg total) by mouth as needed (palpitations/high heart rate). 08/08/22   Pricilla Loveless, MD  Multiple Vitamin (MULTIVITAMIN ADULT) TABS as directed Orally    [provider]  risperiDONE (RISPERDAL) 1 MG tablet Take 1 mg by mouth 2 (two) times daily.     [provider]      Allergies    Patient has no known allergies.    Review of Systems   Review of Systems  Constitutional:  Positive for activity change, appetite change and fatigue. Negative for fever.  HENT:  Negative for rhinorrhea.   Respiratory:  Negative for chest tightness and shortness of breath.   Cardiovascular:  Positive for near-syncope. Negative for chest pain.  Gastrointestinal:  Negative for abdominal pain, nausea and vomiting.  Genitourinary:  Negative for dysuria and hematuria.  Musculoskeletal:  Negative for arthralgias and myalgias.  Skin:  Negative for rash.  Neurological:  Positive for dizziness. Negative for light-headedness and headaches.   all other systems are negative except as noted in the HPI and PMH.    Physical Exam Updated Vital Signs BP 117/74   Pulse (!) 51   Temp 98.7 F (37.1 C) (Oral)   Resp 17   Ht 5\' 7"  (1.702 m)   Wt 73.5 kg   SpO2 100%   BMI 25.38 kg/m  Physical Exam  Vitals and nursing note reviewed.  Constitutional:      General: He is not in acute distress.    Appearance: He is well-developed.  HENT:     Head: Normocephalic and atraumatic.     Mouth/Throat:     Pharynx: No oropharyngeal exudate.  Eyes:     Conjunctiva/sclera: Conjunctivae normal.     Pupils: Pupils are equal, round, and reactive to light.  Neck:     Comments: No meningismus. Cardiovascular:     Rate and Rhythm: Normal rate and regular rhythm.     Heart sounds: Normal heart sounds. No murmur heard. Pulmonary:     Effort: Pulmonary effort is normal. No  respiratory distress.     Breath sounds: Normal breath sounds.  Abdominal:     Palpations: Abdomen is soft.     Tenderness: There is no abdominal tenderness. There is no guarding or rebound.  Musculoskeletal:        General: No tenderness. Normal range of motion.     Cervical back: Normal range of motion and neck supple.  Skin:    General: Skin is warm.  Neurological:     Mental Status: He is alert and oriented to person, place, and time.     Cranial Nerves: No cranial nerve deficit.     Motor: No abnormal muscle tone.     Coordination: Coordination normal.     Comments:  5/5 strength throughout. CN 2-12 intact.Equal grip strength.   Psychiatric:        Behavior: Behavior normal.     ED Results / Procedures / Treatments   Labs (all labs ordered are listed, but only abnormal results are displayed) Labs Reviewed  BASIC METABOLIC PANEL - Abnormal; Notable for the following components:      Result Value   Glucose, Bld 134 (*)    BUN 7 (*)    All other components within normal limits  CBC - Abnormal; Notable for the following components:   RBC 4.17 (*)    All other components within normal limits  TROPONIN I (HIGH SENSITIVITY) - Abnormal; Notable for the following components:   Troponin I (High Sensitivity) 21 (*)    All other components within normal limits  TROPONIN I (HIGH SENSITIVITY) - Abnormal; Notable for the following components:   Troponin I (High Sensitivity) 19 (*)    All other components within normal limits  D-DIMER, QUANTITATIVE    EKG EKG Interpretation Date/Time:  Saturday April 11 2023 23:15:16 EDT Ventricular Rate:  58 PR Interval:  154 QRS Duration:  78 QT Interval:  386 QTC Calculation: 378 R Axis:   38  Text Interpretation: Sinus bradycardia Nonspecific T wave abnormality Abnormal ECG When compared with ECG of 08-Aug-2022 11:10, PREVIOUS ECG IS PRESENT No significant change was found Confirmed by Glynn Octave [phone]) on 04/12/2023 5:14:50  AM  Radiology DG Chest 2 View  Result Date: 04/11/2023 CLINICAL DATA:  Dizziness, presyncope. EXAM: CHEST - 2 VIEW COMPARISON:  08/08/2022. FINDINGS: The heart size and mediastinal contours are within normal limits. There is atherosclerotic calcification of the aorta. No consolidation, effusion, or pneumothorax. Degenerative changes are present in the thoracic spine. No acute osseous abnormality. IMPRESSION: No active cardiopulmonary disease. Electronically Signed   By: Thornell Sartorius M.D.   On: 04/11/2023 23:53    Procedures Procedures    Medications Ordered in ED Medications  lactated ringers bolus 1,000 mL (has no administration in time range)    ED Course/ Medical Decision Making/ A&P  Medical Decision Making Amount and/or Complexity of Data Reviewed Independent Historian: caregiver Labs: ordered. Decision-making details documented in ED Course. Radiology: ordered and independent interpretation performed. Decision-making details documented in ED Course. ECG/medicine tests: ordered and independent interpretation performed. Decision-making details documented in ED Course.  Risk Decision regarding hospitalization.   Possible syncopal episode preceded with lightheadedness and dizziness and diaphoresis.  No chest pain or shortness of breath.  Vitals are stable.  Neurological exam is nonfocal.  EKG is sinus rhythm, no Brugada, no prolonged QT.  Orthostatics are negative.  Heart rate as low as 40s to 60s.  He does take metoprolol at home as needed.  Workup is reassuring.  Troponin is flat.  Low suspicion for ACS.  D-dimer negative.  Low suspicion for PE.  Concern for cardiogenic syncope without prodrome.  Bradycardia may be contributing.  No evidence of high degree heart block.  No evidence of Brugada or prolonged QT.  Will gently hydrate.  Plan admission for syncope in the setting of bradycardia.  Discussed with Dr. Lazarus Salines.         Final  Clinical Impression(s) / ED Diagnoses Final diagnoses:  Near syncope  Bradycardia    Rx / DC Orders ED Discharge Orders     None         Holli Rengel, Jeannett Senior, MD 04/12/23 [phone]

## 2023-04-20 DIAGNOSIS — R7303 Prediabetes: Secondary | ICD-10-CM | POA: Diagnosis not present

## 2023-04-20 DIAGNOSIS — E7800 Pure hypercholesterolemia, unspecified: Secondary | ICD-10-CM | POA: Diagnosis not present

## 2023-04-20 DIAGNOSIS — Z23 Encounter for immunization: Secondary | ICD-10-CM | POA: Diagnosis not present

## 2023-04-20 DIAGNOSIS — R413 Other amnesia: Secondary | ICD-10-CM | POA: Diagnosis not present

## 2023-04-20 DIAGNOSIS — R55 Syncope and collapse: Secondary | ICD-10-CM | POA: Diagnosis not present

## 2023-05-26 ENCOUNTER — Encounter (HOSPITAL_COMMUNITY): Payer: Self-pay | Admitting: Emergency Medicine

## 2023-05-26 ENCOUNTER — Emergency Department (HOSPITAL_COMMUNITY): Payer: Medicare Other

## 2023-05-26 ENCOUNTER — Emergency Department (HOSPITAL_COMMUNITY)
Admission: EM | Admit: 2023-05-26 | Discharge: 2023-05-26 | Disposition: A | Discharge status: Home / Self Care | Payer: Medicare Other | Attending: Emergency Medicine | Admitting: Emergency Medicine

## 2023-05-26 ENCOUNTER — Other Ambulatory Visit: Payer: Self-pay

## 2023-05-26 DIAGNOSIS — F209 Schizophrenia, unspecified: Secondary | ICD-10-CM | POA: Diagnosis not present

## 2023-05-26 DIAGNOSIS — N400 Enlarged prostate without lower urinary tract symptoms: Secondary | ICD-10-CM | POA: Insufficient documentation

## 2023-05-26 DIAGNOSIS — E785 Hyperlipidemia, unspecified: Secondary | ICD-10-CM | POA: Insufficient documentation

## 2023-05-26 DIAGNOSIS — I471 Supraventricular tachycardia, unspecified: Secondary | ICD-10-CM | POA: Insufficient documentation

## 2023-05-26 DIAGNOSIS — D649 Anemia, unspecified: Secondary | ICD-10-CM | POA: Insufficient documentation

## 2023-05-26 DIAGNOSIS — Z79899 Other long term (current) drug therapy: Secondary | ICD-10-CM | POA: Diagnosis not present

## 2023-05-26 DIAGNOSIS — D72819 Decreased white blood cell count, unspecified: Secondary | ICD-10-CM | POA: Diagnosis not present

## 2023-05-26 DIAGNOSIS — R000 Tachycardia, unspecified: Secondary | ICD-10-CM | POA: Diagnosis not present

## 2023-05-26 LAB — CBC WITH DIFFERENTIAL/PLATELET
Abs Immature Granulocytes: 0.01 10*3/uL (ref 0.00–0.07)
Basophils Absolute: 0 10*3/uL (ref 0.0–0.1)
Basophils Relative: 0 %
Eosinophils Absolute: 0 10*3/uL (ref 0.0–0.5)
Eosinophils Relative: 1 %
HCT: 37.2 % — ABNORMAL LOW (ref 39.0–52.0)
Hemoglobin: 13.8 g/dL (ref 13.0–17.0)
Immature Granulocytes: 0 %
Lymphocytes Relative: 42 %
Lymphs Abs: 1.5 10*3/uL (ref 0.7–4.0)
MCH: 38.2 pg — ABNORMAL HIGH (ref 26.0–34.0)
MCHC: 37.1 g/dL — ABNORMAL HIGH (ref 30.0–36.0)
MCV: 103 fL — ABNORMAL HIGH (ref 80.0–100.0)
Monocytes Absolute: 0.3 10*3/uL (ref 0.1–1.0)
Monocytes Relative: 8 %
Neutro Abs: 1.7 10*3/uL (ref 1.7–7.7)
Neutrophils Relative %: 49 %
Platelets: 141 10*3/uL — ABNORMAL LOW (ref 150–400)
RBC: 3.61 MIL/uL — ABNORMAL LOW (ref 4.22–5.81)
RDW: 13.1 % (ref 11.5–15.5)
WBC: 3.4 10*3/uL — ABNORMAL LOW (ref 4.0–10.5)
nRBC: 0 % (ref 0.0–0.2)

## 2023-05-26 LAB — CBG MONITORING, ED: Glucose-Capillary: 199 mg/dL — ABNORMAL HIGH (ref 70–99)

## 2023-05-26 LAB — BASIC METABOLIC PANEL
Anion gap: 7 (ref 5–15)
BUN: 21 mg/dL (ref 8–23)
CO2: 28 mmol/L (ref 22–32)
Calcium: 9.5 mg/dL (ref 8.9–10.3)
Chloride: 106 mmol/L (ref 98–111)
Creatinine, Ser: 1.12 mg/dL (ref 0.61–1.24)
GFR, Estimated: >60 mL/min (ref >60)
Glucose, Bld: 168 mg/dL — ABNORMAL HIGH (ref 70–99)
Potassium: 3.9 mmol/L (ref 3.5–5.1)
Sodium: 141 mmol/L (ref 135–145)

## 2023-05-26 LAB — RAPID URINE DRUG SCREEN, HOSP PERFORMED
Amphetamines: NOT DETECTED
Barbiturates: NOT DETECTED
Benzodiazepines: NOT DETECTED
Cocaine: NOT DETECTED
Opiates: NOT DETECTED
Tetrahydrocannabinol: NOT DETECTED

## 2023-05-26 LAB — TROPONIN I (HIGH SENSITIVITY): Troponin I (High Sensitivity): 15 ng/L (ref <18)

## 2023-05-26 LAB — MAGNESIUM: Magnesium: 2.2 mg/dL (ref 1.7–2.4)

## 2023-05-26 MED ORDER — DILTIAZEM HCL-DEXTROSE 125-5 MG/125ML-% IV SOLN (PREMIX)
5.0000 mg/h | INTRAVENOUS | Status: DC
  Administered 2023-05-26: 5 mg/h via INTRAVENOUS
  Filled 2023-05-26: qty 125

## 2023-05-26 MED ORDER — ADENOSINE 6 MG/2ML IV SOLN
6.0000 mg | Freq: Once | INTRAVENOUS | Status: AC
  Administered 2023-05-26: 6 mg via INTRAVENOUS
  Filled 2023-05-26: qty 2

## 2023-05-26 MED ORDER — LACTATED RINGERS IV BOLUS
1000.0000 mL | Freq: Once | INTRAVENOUS | Status: AC
  Administered 2023-05-26: 1000 mL via INTRAVENOUS

## 2023-05-26 MED ORDER — DILTIAZEM HCL 30 MG PO TABS
30.0000 mg | ORAL_TABLET | Freq: Once | ORAL | Status: AC
  Administered 2023-05-26: 30 mg via ORAL
  Filled 2023-05-26: qty 1

## 2023-05-26 MED ORDER — ADENOSINE 6 MG/2ML IV SOLN
12.0000 mg | INTRAVENOUS | Status: DC | PRN
  Filled 2023-05-26: qty 4

## 2023-05-26 MED ORDER — DILTIAZEM HCL 30 MG PO TABS: 30.0000 mg | ORAL_TABLET | Freq: Once | ORAL | 0 refills | Status: DC | PRN

## 2023-05-26 MED ORDER — DILTIAZEM HCL 30 MG PO TABS: 30.0000 mg | ORAL_TABLET | ORAL | 0 refills | Status: DC | PRN

## 2023-05-26 MED ORDER — DILTIAZEM LOAD VIA INFUSION
20.0000 mg | Freq: Once | INTRAVENOUS | Status: AC
  Administered 2023-05-26: 20 mg via INTRAVENOUS
  Filled 2023-05-26: qty 20

## 2023-05-26 NOTE — ED Triage Notes (Signed)
Pt from home with complains of fast heart rate. HR found in the 180s. Pt has no complains at this time. Hx of SVT and Afib.

## 2023-05-26 NOTE — Discharge Instructions (Signed)
A prescription for medication called diltiazem was sent to your pharmacy.  You can take this as needed if you have return of rapid heart rate that lasts for more than 10 minutes.  Alternatively, you can also return to the emergency department.  A referral was sent to cardiology.  If you do not hear from their office in the next couple days, call the telephone number below to set up a follow-up appointment.  Today, your blood glucose was slightly high.  This may be just a stress response from the rapid heart rate that you had this morning.  You may also have undiagnosed diabetes or prediabetes.  Please follow-up with your primary care doctor for further evaluation of this.

## 2023-05-26 NOTE — ED Provider Notes (Addendum)
[place] EMERGENCY DEPARTMENT AT [facility] Provider Note   CSN: [id number] Arrival date & time: 05/26/23  4259     History  Chief Complaint  Patient presents with   Tachycardia    Billy Boyd is a 67 y.o. male.  HPI Patient presents for cardia.  Medical history includes SVT, BPH, schizophrenia, anemia, HLD.  He arrives in the ED with his caregiver.  Per caregiver, patient has seemed in his normal state of health lately.  She checks his vital signs every morning.  His typical heart rate is in the 60s.  His vital signs were normal up until this morning.  When she checked his heart rate this morning, it was in the range of 180-200.  Patient states that he feels well and denies any current symptoms.  Per chart review, he was seen in the ED last February for SVT.  At the time, he had recurrent SVT while in the ED.  He was put on a diltiazem drip and transition to oral metoprolol.  He was prescribed as needed metoprolol.  His sister/caregiver reports that he has never taken this.  She is not sure if he has any left at home.    Home Medications Prior to Admission medications   Medication Sig Start Date End Date Taking? Authorizing Provider  atorvastatin (LIPITOR) 10 MG tablet Take 10 mg by mouth daily. 06/05/20  Yes [provider]  Cyanocobalamin (VITAMIN B-12 PO) Take 1 tablet by mouth daily.   Yes [provider]  ferrous sulfate 325 (65 FE) MG EC tablet Take 325 mg by mouth daily with breakfast.   Yes [provider]  Multiple Vitamin (MULTIVITAMIN ADULT) TABS Take 1 tablet by mouth daily.   Yes [provider]  Omega-3 Fatty Acids (FISH OIL PO) Take 1 capsule by mouth daily.   Yes [provider]  risperiDONE (RISPERDAL) 2 MG tablet Take 2 mg by mouth 2 (two) times daily.   Yes [provider]  diltiazem (CARDIZEM) 30 MG tablet Take 1 tablet (30 mg total) by mouth once as needed for up to 1 dose (rapid heart rate).  05/26/23   Gloris Manchester, MD      Allergies    Patient has no known allergies.    Review of Systems   Review of Systems  Respiratory:  Negative for shortness of breath.   Cardiovascular:  Negative for chest pain.  All other systems reviewed and are negative.   Physical Exam Updated Vital Signs BP 108/75   Pulse 61   Resp 19   Ht 5\' 7"  (1.702 m)   Wt 72.6 kg   SpO2 100%   BMI 25.06 kg/m  Physical Exam Vitals and nursing note reviewed.  Constitutional:      General: He is not in acute distress.    Appearance: Normal appearance. He is well-developed. He is not ill-appearing, toxic-appearing or diaphoretic.  HENT:     Head: Normocephalic and atraumatic.     Right Ear: External ear normal.     Left Ear: External ear normal.     Nose: Nose normal.     Mouth/Throat:     Mouth: Mucous membranes are moist.  Eyes:     Extraocular Movements: Extraocular movements intact.     Conjunctiva/sclera: Conjunctivae normal.  Cardiovascular:     Rate and Rhythm: Regular rhythm. Tachycardia present.     Heart sounds: No murmur heard. Pulmonary:     Effort: Pulmonary effort is normal. No respiratory  distress.     Breath sounds: Normal breath sounds. No wheezing or rales.  Abdominal:     General: There is no distension.     Palpations: Abdomen is soft.     Tenderness: There is no abdominal tenderness.  Musculoskeletal:        General: No swelling. Normal range of motion.     Cervical back: Normal range of motion and neck supple.  Skin:    General: Skin is warm and dry.     Coloration: Skin is not jaundiced or pale.  Neurological:     General: No focal deficit present.     Mental Status: He is alert and oriented to person, place, and time.  Psychiatric:        Mood and Affect: Mood normal.        Behavior: Behavior normal.     ED Results / Procedures / Treatments   Labs (all labs ordered are listed, but only abnormal results are displayed) Labs Reviewed  BASIC METABOLIC PANEL  - Abnormal; Notable for the following components:      Result Value   Glucose, Bld 168 (*)    All other components within normal limits  CBC WITH DIFFERENTIAL/PLATELET - Abnormal; Notable for the following components:   WBC 3.4 (*)    RBC 3.61 (*)    HCT 37.2 (*)    MCV 103.0 (*)    MCH 38.2 (*)    MCHC 37.1 (*)    Platelets 141 (*)    All other components within normal limits  CBG MONITORING, ED - Abnormal; Notable for the following components:   Glucose-Capillary 199 (*)    All other components within normal limits  MAGNESIUM  RAPID URINE DRUG SCREEN, HOSP PERFORMED  TROPONIN I (HIGH SENSITIVITY)    EKG EKG Interpretation Date/Time:  Tuesday May 26 2023 10:36:41 EST Ventricular Rate:  75 PR Interval:  189 QRS Duration:  86 QT Interval:  347 QTC Calculation: 388 R Axis:   25  Text Interpretation: Sinus rhythm Borderline repolarization abnormality Confirmed by Gloris Manchester [phone]) on 05/26/2023 10:43:36 AM  Radiology DG Chest Portable 1 View  Result Date: 05/26/2023 CLINICAL DATA:  Tachycardia. EXAM: PORTABLE CHEST 1 VIEW COMPARISON:  04/11/2023. FINDINGS: Bilateral lung fields are clear. Bilateral costophrenic angles are clear. Normal cardio-mediastinal silhouette. No acute osseous abnormalities. The soft tissues are within normal limits. IMPRESSION: No active disease. Electronically Signed   By: Jules Schick M.D.   On: 05/26/2023 12:39    Procedures Procedures    Medications Ordered in ED Medications  lactated ringers bolus 1,000 mL (0 mLs Intravenous Stopped 05/26/23 1152)  adenosine (ADENOCARD) 6 MG/2ML injection 6 mg (6 mg Intravenous Given 05/26/23 1022)  diltiazem (CARDIZEM) 1 mg/mL load via infusion 20 mg (20 mg Intravenous Bolus from Bag 05/26/23 1033)  diltiazem (CARDIZEM) tablet 30 mg (30 mg Oral Given 05/26/23 1050)    ED Course/ Medical Decision Making/ A&P                                 Medical Decision Making Amount and/or Complexity of Data  Reviewed Labs: ordered. Radiology: ordered.  Risk Prescription drug management.   This patient presents to the ED for concern of tachycardia, this involves an extensive number of treatment options, and is a complaint that carries with it a high risk of complications and morbidity.  The differential diagnosis includes SVT, other arrhythmia, dehydration, metabolic  derangements, sympathomimetic drug/medications   Co morbidities that complicate the patient evaluation  SVT, BPH, schizophrenia, anemia, HLD   Additional history obtained:  Additional history obtained from patient's sister/caregiver External records from outside source obtained and reviewed including EMR   Lab Tests:  I Ordered, and personally interpreted labs.  The pertinent results include: Normal hemoglobin, baseline leukopenia, normal kidney function, normal electrolytes, normal troponin   Imaging Studies ordered:  I ordered imaging studies including chest x-ray I independently visualized and interpreted imaging which showed no acute findings I agree with the radiologist interpretation   Cardiac Monitoring: / EKG:  The patient was maintained on a cardiac monitor.  I personally viewed and interpreted the cardiac monitored which showed an underlying rhythm of: Initially SVT, subsequently sinus rhythm  Problem List / ED Course / Critical interventions / Medication management  Patient presents for incidental finding of tachycardia this morning.  He has a history of SVT.  EKG today shows narrow complex regular tachycardia with heart rate of 186.  Patient was placed on bedside cardiac monitor.  He is overall well in appearance and denies any current symptoms.  EKG is consistent with SVT.  Pads were placed on patient.  He is normotensive at this time.  He was given 6 mg of adenosine.  He had brief resolution of SVT, which then returned after only a few seconds.  Diltiazem bolus and gtt. were ordered.  Shortly after  starting diltiazem bolus, patient converted to normal sinus rhythm with heart rate in the range of 70.  He received approximately 7 mg of the diltiazem bolus.  At this point, bolus was stopped.  He was given oral dose of diltiazem.  Patient remained in normal sinus rhythm throughout the remainder of his ED observation.  He remained asymptomatic.  On reassessment, he continues to rest comfortably.  Laboratory workup was reassuring.  His blood glucose was mildly elevated.  This may be stress response from his SVT this morning.  He was advised to follow-up with his primary care doctor for evaluation of possible diabetes or prediabetes.  Given his low heart rate at baseline, will not initiate daily AV nodal agent.  Patient was prescribed as needed diltiazem for any recurrence of tachycardia.  He was advised to follow-up with cardiology and to return to the emergency department for any recurrence of concerning symptoms.  He was discharged in stable condition. I ordered medication including adenosine for SVT; diltiazem for rate control; IV fluids for hydration Reevaluation of the patient after these medicines showed that the patient improved I have reviewed the patients home medicines and have made adjustments as needed   Social Determinants of Health:  Lives at home with sister   CRITICAL CARE Performed by: Gloris Manchester   Total critical care time: 32 minutes  Critical care time was exclusive of separately billable procedures and treating other patients.  Critical care was necessary to treat or prevent imminent or life-threatening deterioration.  Critical care was time spent personally by me on the following activities: development of treatment plan with patient and/or surrogate as well as nursing, discussions with consultants, evaluation of patient's response to treatment, examination of patient, obtaining history from patient or surrogate, ordering and performing treatments and interventions, ordering  and review of laboratory studies, ordering and review of radiographic studies, pulse oximetry and re-evaluation of patient's condition.         Final Clinical Impression(s) / ED Diagnoses Final diagnoses:  SVT (supraventricular tachycardia) (HCC)    Rx /  DC Orders ED Discharge Orders          Ordered    Ambulatory referral to Cardiology       Comments: If you have not heard from the Cardiology office within the next 72 hours please call [phone].   05/26/23 1243    diltiazem (CARDIZEM) 30 MG tablet  As needed,   Status:  Discontinued        05/26/23 1245    diltiazem (CARDIZEM) 30 MG tablet  Once PRN        05/26/23 1250              Gloris Manchester, MD 05/26/23 1248    Gloris Manchester, MD 05/26/23 1250

## 2023-05-26 NOTE — ED Notes (Addendum)
Pt's heart rate down to the 71 while receiving the Cardizem bolus ( approx 7mg  ) bolus and drip stopped per  MD verbal order

## 2023-05-27 DIAGNOSIS — I471 Supraventricular tachycardia, unspecified: Secondary | ICD-10-CM | POA: Diagnosis not present

## 2023-06-02 ENCOUNTER — Encounter: Payer: Self-pay | Admitting: Physician Assistant

## 2023-06-02 ENCOUNTER — Ambulatory Visit: Payer: Medicare Other | Attending: Physician Assistant | Admitting: Physician Assistant

## 2023-06-02 VITALS — BP 138/62 | HR 53 | Ht 67.0 in | Wt 167.2 lb

## 2023-06-02 DIAGNOSIS — R001 Bradycardia, unspecified: Secondary | ICD-10-CM

## 2023-06-02 DIAGNOSIS — I471 Supraventricular tachycardia, unspecified: Secondary | ICD-10-CM | POA: Diagnosis not present

## 2023-06-02 DIAGNOSIS — I480 Paroxysmal atrial fibrillation: Secondary | ICD-10-CM

## 2023-06-02 NOTE — Patient Instructions (Signed)
Medication Instructions:   Your physician recommends that you continue on your current medications as directed. Please refer to the Current Medication list given to you today.  *If you need a refill on your cardiac medications before your next appointment, please call your pharmacy*   Lab Work: NONE ORDERED  TODAY    If you have labs (blood work) drawn today and your tests are completely normal, you will receive your results only by: MyChart Message (if you have MyChart) OR A paper copy in the mail If you have any lab test that is abnormal or we need to change your treatment, we will call you to review the results.   Testing/Procedures: NONE ORDERED  TODAY      Follow-Up: At [facility], you and your health needs are our priority.  As part of our continuing mission to provide you with exceptional heart care, we have created designated Provider Care Teams.  These Care Teams include your primary Cardiologist (physician) and Advanced Practice Providers (APPs -  Physician Assistants and Nurse Practitioners) who all work together to provide you with the care you need, when you need it.  We recommend signing up for the patient portal called "MyChart".  Sign up information is provided on this After Visit Summary.  MyChart is used to connect with patients for Virtual Visits (Telemedicine).  Patients are able to view lab/test results, encounter notes, upcoming appointments, etc.  Non-urgent messages can be sent to your provider as well.   To learn more about what you can do with MyChart, go to [email or web address].    Your next appointment:   6 month(s)  Provider:   You may see Will Jorja Loa, MD or one of the following Advanced Practice Providers on your designated Care Team:   Francis Dowse, [place]   Other Instructions

## 2023-06-02 NOTE — Progress Notes (Signed)
Cardiology Office Note:  .   Date:  06/02/2023  ID:  Billy Boyd, DOB [birth year], MRN [id number] PCP: Billy Housekeeper, MD  Billy Boyd Providers Cardiologist:  None Electrophysiologist:  Billy Jorja Loa, MD {  History of Present Illness: .   Billy Boyd is a 67 y.o. male w/PMHx of syncope (described a vasovagal), schizophrenia, ETOH abuse, SVT  He saw Billy Boyd 09/19/21, doing well, no reports of symptoms/complaints.  Reports hx of a short RP SVT, pt had not wanted to pursue ablation, as well as asymptomatic sinus bradycardia.  Transitioned to Billy Boyd, saw him 08/11/22, had an ER visit with his SVT treated w/adenosine/vagal maneuver (perhaps triggered by the passing of his sister the day prior) went home with PRN metoprolol. Billy Boyd noted an EKG from the ER with AFib  Given low risk score and low burden, not started on OAC.  Pt continued to want to avoid procedures.  ER visit 04/11/23, dizziness, near syncope, ? If syncope, negative orthostatics, HRs 40's-60s observed, neg Trop, d-dimer, hydrated, briefly admitted, discharged/same day  01-Jun-2023 reported waking with tachycardia, noting elevated HRs when checking his morning vitals. Treated with adenosine w/ reports of brief interruption of SVT >  dilt gtt with resolution of his SVT Discharged Sister reported that he had not used the PRN metoprolol   Today's visit is scheduled as a post hospital visit  ROS:   He is accompanied by his younger sister that he has lived with now for 12 years The 2 ER episodes wioth SVT the only this year When she checks his vitsl, mostly HR 50's-60's, has dipped to the 40's but when he walks around and she checks again is back up. He has not had any recurrent dizzy or lightheaded spells No CP, SOB, DOE  They both mention that when he has an exacerbation of hearing voices, is upsetting and has been a trigger for his SVT. He is active, and otherwise feeling well They see his  psychologist soon  Arrhythmia/AAD hx No AAD to date  Studies Reviewed: Marland Kitchen    EKGs done June 01, 2023 and reviewed by myself:   SVT 186bpm SR 75bpm, normal intervals  04/12/23: TTE 1. Left ventricular ejection fraction, by estimation, is 60 to 65%. The  left ventricle has normal function. The left ventricle has no regional  wall motion abnormalities. Left ventricular diastolic parameters were  normal.   2. Right ventricular systolic function is normal. The right ventricular  size is normal.   3. The mitral valve is normal in structure. No evidence of mitral valve  regurgitation. No evidence of mitral stenosis.   4. The aortic valve is tricuspid. Aortic valve regurgitation is not  visualized. Aortic valve sclerosis is present, with no evidence of aortic  valve stenosis.    TTE 2020   1. The left ventricle has normal systolic function with an ejection  fraction of 60-65%. The cavity size was normal. Left ventricular diastolic  parameters were normal.   2. The right ventricle has normal systolic function. The cavity was  normal. There is no increase in right ventricular wall thickness. Right  ventricular systolic pressure is normal with an estimated pressure of 24.2  mmHg.   3. Left atrial size was moderately dilated.   4. Trivial pericardial effusion is present.   5. No evidence of mitral valve stenosis.   6. No stenosis of the aortic valve.   7. The aorta is normal unless otherwise noted.    Risk Assessment/Calculations:  Physical Exam:   VS:  There were no vitals taken for this visit.   Wt Readings from Last 3 Encounters:  05/26/23 160 lb (72.6 kg)  04/11/23 162 lb 0.6 oz (73.5 kg)  08/11/22 162 lb (73.5 kg)    GEN: Well nourished, well developed in no acute distress NECK: No JVD; No carotid bruits CARDIAC: RRR, no murmurs, rubs, gallops RESPIRATORY:  CTA b/l without rales, wheezing or rhonchi  ABDOMEN: Soft, non-tender, non-distended EXTREMITIES:  No edema; No  deformity    ASSESSMENT AND PLAN: .    SVT Perhaps triggered by emotional/mental stressors They are both most comfortable with monitoring his burden and avoiding ablation  Asymptomatic bradycardia Discussed symptoms to seek attention for Avoid daily nodal blockers, discussed use of PRN Dilt  Paroxysmal Afib Once that we are aware of CHA2DS2Vasc is one for age, not on OAC      Dispo: back in 6 mo, sooner if needed    Signed, Billy Pigeon, PA-C

## 2023-06-03 DIAGNOSIS — I471 Supraventricular tachycardia, unspecified: Secondary | ICD-10-CM | POA: Diagnosis not present

## 2023-06-03 DIAGNOSIS — R002 Palpitations: Secondary | ICD-10-CM | POA: Diagnosis not present

## 2023-06-17 DIAGNOSIS — R002 Palpitations: Secondary | ICD-10-CM | POA: Diagnosis not present

## 2023-06-18 DIAGNOSIS — R002 Palpitations: Secondary | ICD-10-CM | POA: Diagnosis not present

## 2023-08-13 DIAGNOSIS — D696 Thrombocytopenia, unspecified: Secondary | ICD-10-CM | POA: Diagnosis not present

## 2023-08-13 DIAGNOSIS — R7303 Prediabetes: Secondary | ICD-10-CM | POA: Diagnosis not present

## 2023-08-13 DIAGNOSIS — E7800 Pure hypercholesterolemia, unspecified: Secondary | ICD-10-CM | POA: Diagnosis not present

## 2023-08-13 DIAGNOSIS — I471 Supraventricular tachycardia, unspecified: Secondary | ICD-10-CM | POA: Diagnosis not present

## 2023-08-13 DIAGNOSIS — Z0000 Encounter for general adult medical examination without abnormal findings: Secondary | ICD-10-CM | POA: Diagnosis not present

## 2023-08-13 DIAGNOSIS — D72819 Decreased white blood cell count, unspecified: Secondary | ICD-10-CM | POA: Diagnosis not present

## 2023-08-14 ENCOUNTER — Encounter (HOSPITAL_COMMUNITY): Payer: Self-pay

## 2023-08-14 ENCOUNTER — Emergency Department (HOSPITAL_COMMUNITY)
Admission: EM | Admit: 2023-08-14 | Discharge: 2023-08-14 | Disposition: A | Discharge status: Home / Self Care | Payer: Medicare Other | Attending: Emergency Medicine | Admitting: Emergency Medicine

## 2023-08-14 ENCOUNTER — Ambulatory Visit (HOSPITAL_COMMUNITY)
Admission: EM | Admit: 2023-08-14 | Discharge: 2023-08-14 | Disposition: A | Discharge status: Home / Self Care | Payer: Medicare Other | Attending: Emergency Medicine | Admitting: Emergency Medicine

## 2023-08-14 ENCOUNTER — Emergency Department (HOSPITAL_COMMUNITY): Payer: Medicare Other

## 2023-08-14 DIAGNOSIS — J101 Influenza due to other identified influenza virus with other respiratory manifestations: Secondary | ICD-10-CM | POA: Insufficient documentation

## 2023-08-14 DIAGNOSIS — I471 Supraventricular tachycardia, unspecified: Secondary | ICD-10-CM

## 2023-08-14 DIAGNOSIS — I700 Atherosclerosis of aorta: Secondary | ICD-10-CM | POA: Diagnosis not present

## 2023-08-14 DIAGNOSIS — R079 Chest pain, unspecified: Secondary | ICD-10-CM | POA: Diagnosis not present

## 2023-08-14 DIAGNOSIS — R000 Tachycardia, unspecified: Secondary | ICD-10-CM | POA: Diagnosis present

## 2023-08-14 DIAGNOSIS — J111 Influenza due to unidentified influenza virus with other respiratory manifestations: Secondary | ICD-10-CM

## 2023-08-14 LAB — CBC
HCT: 36.4 % — ABNORMAL LOW (ref 39.0–52.0)
Hemoglobin: 13 g/dL (ref 13.0–17.0)
MCH: 36.9 pg — ABNORMAL HIGH (ref 26.0–34.0)
MCHC: 35.7 g/dL (ref 30.0–36.0)
MCV: 103.4 fL — ABNORMAL HIGH (ref 80.0–100.0)
Platelets: 128 10*3/uL — ABNORMAL LOW (ref 150–400)
RBC: 3.52 MIL/uL — ABNORMAL LOW (ref 4.22–5.81)
RDW: 13.8 % (ref 11.5–15.5)
WBC: 4.3 10*3/uL (ref 4.0–10.5)
nRBC: 0 % (ref 0.0–0.2)

## 2023-08-14 LAB — I-STAT CG4 LACTIC ACID, ED: Lactic Acid, Venous: 1.1 mmol/L (ref 0.5–1.9)

## 2023-08-14 LAB — HEPATIC FUNCTION PANEL
ALT: 24 U/L (ref 0–44)
AST: 36 U/L (ref 15–41)
Albumin: 3.9 g/dL (ref 3.5–5.0)
Alkaline Phosphatase: 48 U/L (ref 38–126)
Bilirubin, Direct: 0.2 mg/dL (ref 0.0–0.2)
Indirect Bilirubin: 0.8 mg/dL (ref 0.3–0.9)
Total Bilirubin: 1 mg/dL (ref 0.0–1.2)
Total Protein: 6.5 g/dL (ref 6.5–8.1)

## 2023-08-14 LAB — BASIC METABOLIC PANEL
Anion gap: 15 (ref 5–15)
BUN: 16 mg/dL (ref 8–23)
CO2: 21 mmol/L — ABNORMAL LOW (ref 22–32)
Calcium: 9.7 mg/dL (ref 8.9–10.3)
Chloride: 106 mmol/L (ref 98–111)
Creatinine, Ser: 1.33 mg/dL — ABNORMAL HIGH (ref 0.61–1.24)
GFR, Estimated: 59 mL/min — ABNORMAL LOW (ref >60)
Glucose, Bld: 104 mg/dL — ABNORMAL HIGH (ref 70–99)
Potassium: 4.1 mmol/L (ref 3.5–5.1)
Sodium: 142 mmol/L (ref 135–145)

## 2023-08-14 LAB — MAGNESIUM: Magnesium: 2.2 mg/dL (ref 1.7–2.4)

## 2023-08-14 LAB — TSH: TSH: 0.737 u[IU]/mL (ref 0.350–4.500)

## 2023-08-14 LAB — RESP PANEL BY RT-PCR (RSV, FLU A&B, COVID)  RVPGX2
Influenza A by PCR: POSITIVE — AB
Influenza B by PCR: NEGATIVE
Resp Syncytial Virus by PCR: NEGATIVE
SARS Coronavirus 2 by RT PCR: NEGATIVE

## 2023-08-14 LAB — TROPONIN I (HIGH SENSITIVITY)
Troponin I (High Sensitivity): 32 ng/L — ABNORMAL HIGH (ref <18)
Troponin I (High Sensitivity): 45 ng/L — ABNORMAL HIGH (ref <18)

## 2023-08-14 MED ORDER — ACETAMINOPHEN 500 MG PO TABS
1000.0000 mg | ORAL_TABLET | Freq: Once | ORAL | Status: AC
  Administered 2023-08-14: 1000 mg via ORAL
  Filled 2023-08-14: qty 2

## 2023-08-14 MED ORDER — SODIUM CHLORIDE 0.9 % IV BOLUS
1000.0000 mL | Freq: Once | INTRAVENOUS | Status: AC
  Administered 2023-08-14: 1000 mL via INTRAVENOUS

## 2023-08-14 MED ORDER — OSELTAMIVIR PHOSPHATE 30 MG PO CAPS: 30.0000 mg | ORAL_CAPSULE | Freq: Two times a day (BID) | ORAL | 0 refills | Status: DC

## 2023-08-14 MED ORDER — DILTIAZEM HCL-DEXTROSE 125-5 MG/125ML-% IV SOLN (PREMIX)
5.0000 mg/h | INTRAVENOUS | Status: DC
  Administered 2023-08-14: 5 mg/h via INTRAVENOUS
  Filled 2023-08-14: qty 125

## 2023-08-14 MED ORDER — DILTIAZEM LOAD VIA INFUSION
10.0000 mg | Freq: Once | INTRAVENOUS | Status: AC
  Administered 2023-08-14: 10 mg via INTRAVENOUS
  Filled 2023-08-14: qty 10

## 2023-08-14 NOTE — ED Notes (Signed)
Pt placed on zoll. Placed on cardiac monitor. VS obtained.

## 2023-08-14 NOTE — ED Notes (Signed)
Noted on EKG SVT-rate 199. [place] Garrison, NP notified. Paatient denies chest pain, but does endorse tingling of his right arm.

## 2023-08-14 NOTE — ED Triage Notes (Signed)
PT tx from [facility] UC for SVT at rate of 196.  Pt is BP is 115/80. Hx of SVT.   Pt had syncopal episode at home. PT brought to UC by sister.   20G R. AC.

## 2023-08-14 NOTE — ED Provider Notes (Signed)
EMERGENCY DEPARTMENT AT [facility] Provider Note   CSN: [id number] Arrival date & time: 08/14/23  1439     History  No chief complaint on file.   Donley Harland is a 68 y.o. male.  HPI 68 year old male history of schizophrenia and recurrent SVT presenting for tachycardia.  At home he had a brief syncopal episode and was slightly dizzy before.  His heart rate was normal at home.  They went to urgent care and he was in SVT with HR in the 190's.  He currently feels well, denies CP, SOB, fever, chills, vomiting, abdominal pain.  He has not missed any medication doses.  No leg swelling.     Home Medications Prior to Admission medications   Medication Sig Start Date End Date Taking? Authorizing Provider  oseltamivir (TAMIFLU) 30 MG capsule Take 1 capsule (30 mg total) by mouth every 12 (twelve) hours. 08/14/23  Yes Laurence Spates, MD  atorvastatin (LIPITOR) 10 MG tablet Take 10 mg by mouth daily. 06/05/20   [provider]  Cyanocobalamin (VITAMIN B-12 PO) Take 1 tablet by mouth daily.    [provider]  diltiazem (CARDIZEM) 30 MG tablet Take 1 tablet (30 mg total) by mouth once as needed for up to 1 dose (rapid heart rate). 05/26/23   Gloris Manchester, MD  ferrous sulfate 325 (65 FE) MG EC tablet Take 325 mg by mouth daily with breakfast.    [provider]  Multiple Vitamin (MULTIVITAMIN ADULT) TABS Take 1 tablet by mouth daily.    [provider]  Omega-3 Fatty Acids (FISH OIL PO) Take 1 capsule by mouth daily.    [provider]  risperiDONE (RISPERDAL) 2 MG tablet Take 2 mg by mouth 2 (two) times daily.    [provider]      Allergies    Patient has no known allergies.    Review of Systems   Review of Systems Review of systems completed and notable as per HPI.  ROS otherwise negative.   Physical Exam Updated Vital Signs BP 100/63   Pulse (!) 54   Temp 99.6 F (37.6 C) (Oral)   Resp 19   Ht 5\' 7"   (1.702 m)   Wt 79.1 kg   SpO2 99%   BMI 27.31 kg/m  Physical Exam Vitals and nursing note reviewed.  Constitutional:      General: He is not in acute distress.    Appearance: He is well-developed.  HENT:     Head: Normocephalic and atraumatic.     Mouth/Throat:     Mouth: Mucous membranes are moist.     Pharynx: Oropharynx is clear.  Eyes:     Extraocular Movements: Extraocular movements intact.     Conjunctiva/sclera: Conjunctivae normal.     Pupils: Pupils are equal, round, and reactive to light.  Cardiovascular:     Rate and Rhythm: Regular rhythm. Tachycardia present.     Pulses: Normal pulses.     Heart sounds: Normal heart sounds. No murmur heard. Pulmonary:     Effort: Pulmonary effort is normal. No respiratory distress.     Breath sounds: Normal breath sounds.  Abdominal:     Palpations: Abdomen is soft.     Tenderness: There is no abdominal tenderness.  Musculoskeletal:        General: No swelling.     Cervical back: Neck supple.     Right lower leg: No edema.     Left lower leg: No edema.  Skin:    General: Skin is warm and dry.     Capillary Refill: Capillary refill takes less than 2 seconds.  Neurological:     General: No focal deficit present.     Mental Status: He is alert and oriented to person, place, and time. Mental status is at baseline.  Psychiatric:        Mood and Affect: Mood normal.     ED Results / Procedures / Treatments   Labs (all labs ordered are listed, but only abnormal results are displayed) Labs Reviewed  RESP PANEL BY RT-PCR (RSV, FLU A&B, COVID)  RVPGX2 - Abnormal; Notable for the following components:      Result Value   Influenza A by PCR POSITIVE (*)    All other components within normal limits  BASIC METABOLIC PANEL - Abnormal; Notable for the following components:   CO2 21 (*)    Glucose, Bld 104 (*)    Creatinine, Ser 1.33 (*)    GFR, Estimated 59 (*)    All other components within normal limits  CBC - Abnormal;  Notable for the following components:   RBC 3.52 (*)    HCT 36.4 (*)    MCV 103.4 (*)    MCH 36.9 (*)    Platelets 128 (*)    All other components within normal limits  TROPONIN I (HIGH SENSITIVITY) - Abnormal; Notable for the following components:   Troponin I (High Sensitivity) 32 (*)    All other components within normal limits  TROPONIN I (HIGH SENSITIVITY) - Abnormal; Notable for the following components:   Troponin I (High Sensitivity) 45 (*)    All other components within normal limits  CULTURE, BLOOD (ROUTINE X 2)  CULTURE, BLOOD (ROUTINE X 2)  HEPATIC FUNCTION PANEL  TSH  MAGNESIUM  I-STAT CG4 LACTIC ACID, ED    EKG EKG Interpretation Date/Time:  Friday August 14 2023 14:42:56 EST Ventricular Rate:  192 PR Interval:    QRS Duration:  72 QT Interval:  243 QTC Calculation: 436 R Axis:   0  Text Interpretation: Supraventricular tachycardia Repolarization abnormality, prob rate related Confirmed by Fulton Reek [phone]) on 08/14/2023 3:05:58 PM  Radiology DG Chest Port 1 View Result Date: 08/14/2023 CLINICAL DATA:  Chest pain EXAM: PORTABLE CHEST 1 VIEW COMPARISON:  05/26/2023 FINDINGS: Atherosclerotic calcification of the aortic arch. Cardiac and mediastinal margins appear normal. The lungs appear clear. No blunting of the costophrenic angles. No significant bony findings. IMPRESSION: 1. No acute findings. 2. Aortic Atherosclerosis (ICD10-I70.0). Electronically Signed   By: Gaylyn Rong M.D.   On: 08/14/2023 17:19    Procedures Procedures    Medications Ordered in ED Medications  diltiazem (CARDIZEM) 1 mg/mL load via infusion 10 mg (10 mg Intravenous Bolus from Bag 08/14/23 1534)    And  diltiazem (CARDIZEM) 125 mg in dextrose 5% 125 mL (1 mg/mL) infusion (0 mg/hr Intravenous Stopped 08/14/23 1547)  acetaminophen (TYLENOL) tablet 1,000 mg (1,000 mg Oral Given 08/14/23 1528)  sodium chloride 0.9 % bolus 1,000 mL (0 mLs Intravenous Stopped 08/14/23 1913)     ED Course/ Medical Decision Making/ A&P Clinical Course as of 08/14/23 2311  Fri Aug 14, 2023  1743 Patient remained stable.  He converted to normal sinus rhythm with diltiazem bolus, no drip was necessary.  He is flu positive which is consistent with his symptoms. [JD]  1946 I spoke with cardiology, they are comfortable with him going home.  Recommend continuing Cardizem as needed for SVT. [JD]  Clinical Course User Index [JD] Laurence Spates, MD                                 Medical Decision Making Amount and/or Complexity of Data Reviewed Labs: ordered. Radiology: ordered.  Risk OTC drugs. Prescription drug management.   Medical Decision Making:   Raiyan Dalesandro is a 68 y.o. male who presented to the ED today with SVT.  Patient arrives in narrow complex tachycardia consistent with SVT.  He is also febrile, he has had mild cough, no other infectious symptoms.  No chest pain or SOB.  Per chart review the last two episodes failed vagal maneuvers and adenosine and eventually responded to diltiazem IV, we will trial 10 mg diltiazem bolus and drip.  Will start infectious workup.   Patient placed on continuous vitals and telemetry monitoring while in ED which was reviewed periodically.  Reviewed and confirmed nursing documentation for past medical history, family history, social history.  Reassessment and Plan:   Patient converted to normal sinus rhythm with diltiazem bolus.  Drip was discontinued.  Lab was reassuring.  His mild troponin elevation likely due to demand.  No chest pain of low concern for ACS.  Lactic acid is normal.  No leukocytosis.  He is flu positive which I think explains his fever.  No signs of pneumonia.  He remains helically stable and remains in sinus rhythm.  I spoke with cardiology, who recommended continuing as needed Cardizem at home and no other change to his medications.  They will help schedule follow-up with the EP.  Patient is comfortable with plan  for discharge home.  Recommend follow-up with his PCP as well.  Strict return precautions given.  Started on Tamiflu for flu.  Patient's presentation is most consistent with acute complicated illness / injury requiring diagnostic workup.           Final Clinical Impression(s) / ED Diagnoses Final diagnoses:  SVT (supraventricular tachycardia) (HCC)  Flu    Rx / DC Orders ED Discharge Orders          Ordered    oseltamivir (TAMIFLU) 30 MG capsule  Every 12 hours        08/14/23 1948              Laurence Spates, MD 08/14/23 2311

## 2023-08-14 NOTE — ED Triage Notes (Signed)
Patient and patient's sister report that the patient "passed out for a few seconds" Patient did not hit his head. No neuro deficits noted.  Patient's sister reports that the patient had a BP 89/65 and HR-99 prior to arrival to the UC.  Patient also reports tingling in his right arm.

## 2023-08-14 NOTE — ED Notes (Signed)
Patient is being discharged from the Urgent Care and sent to the Emergency Department via [facility] . Per [facility], patient is in need of higher level of care due to SVT-rate-199. Patient is aware and verbalizes understanding of plan of care.  Vitals:   08/14/23 1400 08/14/23 1425  BP: 105/86 108/80  Pulse: 100 (!) 199  Resp: 16   SpO2: 100%

## 2023-08-14 NOTE — ED Provider Notes (Signed)
[facility]    CSN: [id number] Arrival date & time: 08/14/23  1352      History   Chief Complaint Chief Complaint  Patient presents with   Loss of Consciousness    HPI Billy Boyd is a 68 y.o. male.   Brought into clinic by sister.  Patient is routinely cared for by his other sister, the sister just stop by today, she lives locally in [place].  She witnessed a syncopal episode from standing, he did not hit his head.  He came to shortly after.  At home the patient had a blood pressure of 89/65.  Patient is unsure if he has taken his Cardizem, of note he does have Cardizem prescribed as needed for palpitations or fast heart rate.  Does not feel like his heart rate is beating fast.  Patient does not appear to be the best historian. Sister reports this is his baseline mentation.   The history is provided by the patient, medical records and a relative.  Loss of Consciousness   Past Medical History:  Diagnosis Date   Anemia    Benign localized prostatic hyperplasia with lower urinary tract symptoms (LUTS)    History of alcohol abuse    History of chest pain    ED visit 01-17-2016 dx unspecified chest pain, GERD, hyponatremia   History of supraventricular tachycardia    ED visit 12-15-2015;  per d/c note by dr Johney Frame (cardiology) to follow up as outpt for ETT and Echo   History of syncope    ED visit 01-27-2017 w/ LOC-- situational (bowel movement) and vasovagal   Schizophrenia, chronic condition [facility])     Patient Active Problem List   Diagnosis Date Noted   Near syncope 04/12/2023   Hyperlipidemia 04/12/2023   Anemia associated with acute blood loss 03/06/2019   S/P TURP 03/06/2019   Schizophrenia (HCC) 03/06/2019   Symptomatic anemia    BPH (benign prostatic hyperplasia) 03/03/2019   SVT (supraventricular tachycardia) (HCC) 12/15/2015    Past Surgical History:  Procedure Laterality Date   TRANSURETHRAL RESECTION OF PROSTATE N/A 03/03/2019   Procedure:  TRANSURETHRAL RESECTION OF THE PROSTATE (TURP);  Surgeon: Malen Gauze, MD;  Location[facility];  Service: Urology;  Laterality: N/A;       Home Medications    Prior to Admission medications   Medication Sig Start Date End Date Taking? Authorizing Provider  atorvastatin (LIPITOR) 10 MG tablet Take 10 mg by mouth daily. 06/05/20   [provider]  Cyanocobalamin (VITAMIN B-12 PO) Take 1 tablet by mouth daily.    [provider]  diltiazem (CARDIZEM) 30 MG tablet Take 1 tablet (30 mg total) by mouth once as needed for up to 1 dose (rapid heart rate). 05/26/23   Gloris Manchester, MD  ferrous sulfate 325 (65 FE) MG EC tablet Take 325 mg by mouth daily with breakfast.    [provider]  Multiple Vitamin (MULTIVITAMIN ADULT) TABS Take 1 tablet by mouth daily.    [provider]  Omega-3 Fatty Acids (FISH OIL PO) Take 1 capsule by mouth daily.    [provider]  risperiDONE (RISPERDAL) 2 MG tablet Take 2 mg by mouth 2 (two) times daily.    [provider]    Family History Family History  Problem Relation Age of Onset   Heart disease Mother        Pacemaker    Social History Social History   Tobacco Use   Smoking status:  Never   Smokeless tobacco: Never  Vaping Use   Vaping status: Never Used  Substance Use Topics   Alcohol use: No   Drug use: No     Allergies   Patient has no known allergies.   Review of Systems Review of Systems  Cardiovascular:  Positive for syncope.   Per HPI   Physical Exam Triage Vital Signs ED Triage Vitals [08/14/23 1400]  Encounter Vitals Group     BP 105/86     Systolic BP Percentile      Diastolic BP Percentile      Pulse Rate 100     Resp 16     Temp      Temp src      SpO2 100 %     Weight      Height      Head Circumference      Peak Flow      Pain Score 0     Pain Loc      Pain Education      Exclude from Growth Chart    No data found.  Updated  Vital Signs BP 108/80   Pulse (!) 199   Resp 16   SpO2 100%   Visual Acuity Right Eye Distance:   Left Eye Distance:   Bilateral Distance:    Right Eye Near:   Left Eye Near:    Bilateral Near:     Physical Exam Vitals reviewed.  HENT:     Head: Normocephalic and atraumatic.     Right Ear: External ear normal.     Left Ear: External ear normal.     Nose: Nose normal.     Mouth/Throat:     Mouth: Mucous membranes are moist.  Eyes:     Conjunctiva/sclera: Conjunctivae normal.  Cardiovascular:     Rate and Rhythm: Tachycardia present.     Heart sounds: Normal heart sounds. No murmur heard. Pulmonary:     Effort: Pulmonary effort is normal. No respiratory distress.     Breath sounds: Normal breath sounds.  Musculoskeletal:        General: Normal range of motion.  Skin:    General: Skin is warm and dry.  Neurological:     Mental Status: He is alert. Mental status is at baseline.  Psychiatric:        Mood and Affect: Mood normal.      UC Treatments / Results  Labs (all labs ordered are listed, but only abnormal results are displayed) Labs Reviewed - No data to display  EKG   Radiology No results found.  Procedures Procedures (including critical care time)  Medications Ordered in UC Medications - No data to display  Initial Impression / Assessment and Plan / UC Course  I have reviewed the triage vital signs and the nursing notes.  Pertinent labs & imaging results that were available during my care of the patient were reviewed by me and considered in my medical decision making (see chart for details).  Vitals and triage reviewed, EKG shows a ventricular rate of 198 bpm.  Patient himself appears to be in no acute distress.  Unclear baseline mentation, sister reports he is at his baseline.  Poor historian and unclear if he has taken any Cardizem recently.   EKG concerning for SVT, history of same.  [facility] called to transport to [facility] emergency  department for further advanced evaluation.  Patient in agreement with plan.  Staff placed 20 G LAC, placed on  ZOLL monitor.       Final Clinical Impressions(s) / UC Diagnoses   Final diagnoses:  SVT (supraventricular tachycardia) (HCC)   Discharge Instructions   None    ED Prescriptions   None    PDMP not reviewed this encounter.   Briya Lookabaugh, [place] N, [place] 08/14/23 1435

## 2023-08-14 NOTE — Discharge Instructions (Signed)
You tested positive for the flu.  He also had an episode of supraventricular tachycardia.  You should continue taking your medications as prescribed.  You are being started on Tamiflu.  You should follow close with your primary care doctor as well as cardiology.  If you develop any chest pain or shortness of breath, or fast heart rate that is not responding to the home Cardizem you should return to the ED.

## 2023-08-14 NOTE — Consult Note (Signed)
CARDIOLOGY CONSULT NOTE       Patient ID: Kuzey Ogata MRN: [id number] DOB/AGE: [birth year] 68 y.o.  Admit date: 08/14/2023 Referring Physician: Earlene Plater [facility] Primary Physician: Georgann Housekeeper, MD Primary Cardiologist: Elberta Fortis Reason for Consultation: SVT  Active Problems:   * No active hospital problems. *   HPI:  68 y.o. seen in ER for recurrent episode of SVT. Narrow complex tachycardia short RP ? AVNRT. First episode noted in 2017 when he saw Dr Johney Frame. Initially Rx with oral beta blockers but tends to have bradycardia with rates in 50's when in NSR. He has 30 mg oral SA cardizem to take when he has episodes. Had an ER visit with same 08/11/22 and again 05/26/23. He has schizophrenia and some mental disability. He lives with one of his sisters. She is out of town on a cruise so he has been staying with two of his other sisters  In ER low grade fever. Dizziness gone when SVT broke with bolus iv cardizem Currently NSR rate 56 bpm. No chest pain or dyspnea Prior TTE;s normal with no structural heart dx. Troponin 45/32 TSH normal Hct 36.4 Respiratory positive for influenza A  CXR NAD   [employer] [phone] Fidel Levy [phone]  ROS All other systems reviewed and negative except as noted above  Past Medical History:  Diagnosis Date   Anemia    Benign localized prostatic hyperplasia with lower urinary tract symptoms (LUTS)    History of alcohol abuse    History of chest pain    ED visit 01-17-2016 dx unspecified chest pain, GERD, hyponatremia   History of supraventricular tachycardia    ED visit 12-15-2015;  per d/c note by dr Johney Frame (cardiology) to follow up as outpt for ETT and Echo   History of syncope    ED visit 01-27-2017 w/ LOC-- situational (bowel movement) and vasovagal   Schizophrenia, chronic condition (HCC)     Family History  Problem Relation Age of Onset   Heart disease Mother        Pacemaker    Social History   Socioeconomic History   Marital status:  Married    Spouse name: Not on file   Number of children: Not on file   Years of education: Not on file   Highest education level: Not on file  Occupational History   Not on file  Tobacco Use   Smoking status: Never   Smokeless tobacco: Never  Vaping Use   Vaping status: Never Used  Substance and Sexual Activity   Alcohol use: No   Drug use: No   Sexual activity: Not on file  Other Topics Concern   Not on file  Social History Narrative   Lives with sister.     Social Drivers of Corporate investment banker Strain: Not on file  Food Insecurity: Not on file  Transportation Needs: Not on file  Physical Activity: Not on file  Stress: Not on file  Social Connections: Not on file  Intimate Partner Violence: Not on file    Past Surgical History:  Procedure Laterality Date   TRANSURETHRAL RESECTION OF PROSTATE N/A 03/03/2019   Procedure: TRANSURETHRAL RESECTION OF THE PROSTATE (TURP);  Surgeon: Malen Gauze, MD;  Location: [facility];  Service: Urology;  Laterality: N/A;      Current Facility-Administered Medications:    [COMPLETED] diltiazem (CARDIZEM) 1 mg/mL load via infusion 10 mg, 10 mg, Intravenous, Once, 10 mg at 08/14/23 1534 **AND** diltiazem (CARDIZEM) 125 mg in dextrose  5% 125 mL (1 mg/mL) infusion, 5-15 mg/hr, Intravenous, Continuous, Laurence Spates, MD, Stopped at 08/14/23 1547  Current Outpatient Medications:    atorvastatin (LIPITOR) 10 MG tablet, Take 10 mg by mouth daily., Disp: , Rfl:    Cyanocobalamin (VITAMIN B-12 PO), Take 1 tablet by mouth daily., Disp: , Rfl:    diltiazem (CARDIZEM) 30 MG tablet, Take 1 tablet (30 mg total) by mouth once as needed for up to 1 dose (rapid heart rate)., Disp: 12 tablet, Rfl: 0   ferrous sulfate 325 (65 FE) MG EC tablet, Take 325 mg by mouth daily with breakfast., Disp: , Rfl:    Multiple Vitamin (MULTIVITAMIN ADULT) TABS, Take 1 tablet by mouth daily., Disp: , Rfl:    Omega-3 Fatty Acids (FISH OIL  PO), Take 1 capsule by mouth daily., Disp: , Rfl:    risperiDONE (RISPERDAL) 2 MG tablet, Take 2 mg by mouth 2 (two) times daily., Disp: , Rfl:    diltiazem (CARDIZEM) infusion Stopped (08/14/23 1547)    Physical Exam: Blood pressure 98/64, pulse (!) 51, temperature 99.5 F (37.5 C), temperature source Oral, resp. rate (!) 21, height 5\' 7"  (1.702 m), weight 79.1 kg, SpO2 100%.    Affect appropriate Healthy:  appears 68 age HEENT: normal Neck supple with no adenopathy JVP normal no bruits no thyromegaly Lungs clear with no wheezing and good diaphragmatic motion Heart:  S1/S2 no murmur, no rub, gallop or click PMI normal Abdomen: benighn, BS positve, no tenderness, no AAA no bruit.  No HSM or HJR Distal pulses intact with no bruits No edema Neuro non-focal Skin warm and dry No muscular weakness   Labs:   Lab Results  Component Value Date   WBC 4.3 08/14/2023   HGB 13.0 08/14/2023   HCT 36.4 (L) 08/14/2023   MCV 103.4 (H) 08/14/2023   PLT 128 (L) 08/14/2023    Recent Labs  Lab 08/14/23 1500  NA 142  K 4.1  CL 106  CO2 21*  BUN 16  CREATININE 1.33*  CALCIUM 9.7  PROT 6.5  BILITOT 1.0  ALKPHOS 48  ALT 24  AST 36  GLUCOSE 104*   Lab Results  Component Value Date   CKTOTAL 169 12/16/2015   TROPONINI <0.03 12/16/2015   No results found for: "CHOL" No results found for: "HDL" No results found for: "LDLCALC" No results found for: "TRIG" No results found for: "CHOLHDL" No results found for: "LDLDIRECT"    Radiology: [facility] Chest Port 1 View Result Date: 08/14/2023 CLINICAL DATA:  Chest pain EXAM: PORTABLE CHEST 1 VIEW COMPARISON:  05/26/2023 FINDINGS: Atherosclerotic calcification of the aortic arch. Cardiac and mediastinal margins appear normal. The lungs appear clear. No blunting of the costophrenic angles. No significant bony findings. IMPRESSION: 1. No acute findings. 2. Aortic Atherosclerosis (ICD10-I70.0). Electronically Signed   By: Gaylyn Rong  M.D.   On: 08/14/2023 17:19    EKG: SVT 190 narrow complex likely AVNRT   ASSESSMENT AND PLAN:   SVT:  recurrent with bradycardia in 50's as baseline NSR rhythm ? Influenza A as trigger this time with conversion IV cardizem. Patient seems more amenable to ablation when discussed. Increase PRN cardizem to 60 mg although patient did not take this time as he was staying with his other sisters.  Will arrange f/u with Dr Elberta Fortis so he can further discuss more definitive Rx Influenza A:  ? Tamiflu per ER doctor CXR NAD WBC normal  Schizophrenia:  On Risperdal seems to have some cognitive  deficits Has good family support from 3 sisters  Signed: Charlton Haws 08/14/2023, 7:24 PM

## 2023-08-19 LAB — CULTURE, BLOOD (ROUTINE X 2): Culture: NO GROWTH

## 2023-09-11 ENCOUNTER — Encounter (HOSPITAL_COMMUNITY): Payer: Self-pay | Admitting: Emergency Medicine

## 2023-09-11 ENCOUNTER — Other Ambulatory Visit: Payer: Self-pay

## 2023-09-11 ENCOUNTER — Emergency Department (HOSPITAL_COMMUNITY)
Admission: EM | Admit: 2023-09-11 | Discharge: 2023-09-11 | Disposition: A | Discharge status: Home / Self Care | Attending: Student | Admitting: Student

## 2023-09-11 DIAGNOSIS — R000 Tachycardia, unspecified: Secondary | ICD-10-CM | POA: Diagnosis present

## 2023-09-11 DIAGNOSIS — I471 Supraventricular tachycardia, unspecified: Secondary | ICD-10-CM | POA: Diagnosis not present

## 2023-09-11 DIAGNOSIS — I499 Cardiac arrhythmia, unspecified: Secondary | ICD-10-CM | POA: Diagnosis not present

## 2023-09-11 DIAGNOSIS — I495 Sick sinus syndrome: Secondary | ICD-10-CM | POA: Diagnosis not present

## 2023-09-11 LAB — COMPREHENSIVE METABOLIC PANEL
ALT: 49 U/L — ABNORMAL HIGH (ref 0–44)
AST: 37 U/L (ref 15–41)
Albumin: 3.8 g/dL (ref 3.5–5.0)
Alkaline Phosphatase: 47 U/L (ref 38–126)
Anion gap: 8 (ref 5–15)
BUN: 16 mg/dL (ref 8–23)
CO2: 23 mmol/L (ref 22–32)
Calcium: 9.2 mg/dL (ref 8.9–10.3)
Chloride: 110 mmol/L (ref 98–111)
Creatinine, Ser: 1.38 mg/dL — ABNORMAL HIGH (ref 0.61–1.24)
GFR, Estimated: 56 mL/min — ABNORMAL LOW (ref >60)
Glucose, Bld: 98 mg/dL (ref 70–99)
Potassium: 4.2 mmol/L (ref 3.5–5.1)
Sodium: 141 mmol/L (ref 135–145)
Total Bilirubin: 1.3 mg/dL — ABNORMAL HIGH (ref 0.0–1.2)
Total Protein: 6.7 g/dL (ref 6.5–8.1)

## 2023-09-11 LAB — CBC WITH DIFFERENTIAL/PLATELET
Abs Immature Granulocytes: 0.01 10*3/uL (ref 0.00–0.07)
Basophils Absolute: 0 10*3/uL (ref 0.0–0.1)
Basophils Relative: 0 %
Eosinophils Absolute: 0 10*3/uL (ref 0.0–0.5)
Eosinophils Relative: 1 %
HCT: 35 % — ABNORMAL LOW (ref 39.0–52.0)
Hemoglobin: 12.9 g/dL — ABNORMAL LOW (ref 13.0–17.0)
Immature Granulocytes: 0 %
Lymphocytes Relative: 43 %
Lymphs Abs: 1.5 10*3/uL (ref 0.7–4.0)
MCH: 38.4 pg — ABNORMAL HIGH (ref 26.0–34.0)
MCHC: 36.9 g/dL — ABNORMAL HIGH (ref 30.0–36.0)
MCV: 104.2 fL — ABNORMAL HIGH (ref 80.0–100.0)
Monocytes Absolute: 0.4 10*3/uL (ref 0.1–1.0)
Monocytes Relative: 13 %
Neutro Abs: 1.5 10*3/uL — ABNORMAL LOW (ref 1.7–7.7)
Neutrophils Relative %: 43 %
Platelets: 120 10*3/uL — ABNORMAL LOW (ref 150–400)
RBC: 3.36 MIL/uL — ABNORMAL LOW (ref 4.22–5.81)
RDW: 14 % (ref 11.5–15.5)
WBC: 3.5 10*3/uL — ABNORMAL LOW (ref 4.0–10.5)
nRBC: 0 % (ref 0.0–0.2)

## 2023-09-11 LAB — TROPONIN I (HIGH SENSITIVITY): Troponin I (High Sensitivity): 28 ng/L — ABNORMAL HIGH (ref <18)

## 2023-09-11 NOTE — ED Provider Notes (Signed)
 [facility] EMERGENCY DEPARTMENT AT [facility] Provider Note  CSN: [id number] Arrival date & time: 09/11/23 1021  Chief Complaint(s) Tachycardia  HPI Billy Boyd is a 68 y.o. male with PMH schizophrenia, alcohol abuse, SVT currently on as needed diltiazem who presents emerged department for evaluation of rapid heart rate.  Patient reported he was seen by his primary care physician this morning for routine follow-up when he was noted to have severely elevated heart rates to 200.  Patient had elevated heart rates last night into the 150s and did receive a  dose of oral diltiazem from his family.  ECG from primary care office is seen below and is in SVT.  By the time of EMS arrival, patient had self converted to normal sinus rhythm.  Patient is asymptomatic during the emergency room with no complaints of chest pain, shortness of breath, abdominal pain, nausea, vomiting or other systemic symptoms.   Past Medical History Past Medical History:  Diagnosis Date   Anemia    Benign localized prostatic hyperplasia with lower urinary tract symptoms (LUTS)    History of alcohol abuse    History of chest pain    ED visit 01-17-2016 dx unspecified chest pain, GERD, hyponatremia   History of supraventricular tachycardia    ED visit 12-15-2015;  per d/c note by dr Johney Frame (cardiology) to follow up as outpt for ETT and Echo   History of syncope    ED visit 01-27-2017 w/ LOC-- situational (bowel movement) and vasovagal   Schizophrenia, chronic condition [facility])    Patient Active Problem List   Diagnosis Date Noted   Near syncope 04/12/2023   Hyperlipidemia 04/12/2023   Anemia associated with acute blood loss 03/06/2019   S/P TURP 03/06/2019   Schizophrenia (HCC) 03/06/2019   Symptomatic anemia    BPH (benign prostatic hyperplasia) 03/03/2019   SVT (supraventricular tachycardia) (HCC) 12/15/2015   Home Medication(s) Prior to Admission medications   Medication Sig Start Date End Date  Taking? Authorizing Provider  atorvastatin (LIPITOR) 10 MG tablet Take 10 mg by mouth daily. 06/05/20   [provider]  Cyanocobalamin (VITAMIN B-12 PO) Take 1 tablet by mouth daily.    [provider]  diltiazem (CARDIZEM) 30 MG tablet Take 1 tablet (30 mg total) by mouth once as needed for up to 1 dose (rapid heart rate). 05/26/23   Gloris Manchester, MD  ferrous sulfate 325 (65 FE) MG EC tablet Take 325 mg by mouth daily with breakfast.    [provider]  Multiple Vitamin (MULTIVITAMIN ADULT) TABS Take 1 tablet by mouth daily.    [provider]  Omega-3 Fatty Acids (FISH OIL PO) Take 1 capsule by mouth daily.    [provider]  oseltamivir (TAMIFLU) 30 MG capsule Take 1 capsule (30 mg total) by mouth every 12 (twelve) hours. 08/14/23   Laurence Spates, MD  risperiDONE (RISPERDAL) 2 MG tablet Take 2 mg by mouth 2 (two) times daily.    [provider]  Past Surgical History Past Surgical History:  Procedure Laterality Date   TRANSURETHRAL RESECTION OF PROSTATE N/A 03/03/2019   Procedure: TRANSURETHRAL RESECTION OF THE PROSTATE (TURP);  Surgeon: Malen Gauze, MD;  Location: [facility];  Service: Urology;  Laterality: N/A;   Family History Family History  Problem Relation Age of Onset   Heart disease Mother        Pacemaker    Social History Social History   Tobacco Use   Smoking status: Never   Smokeless tobacco: Never  Vaping Use   Vaping status: Never Used  Substance Use Topics   Alcohol use: No   Drug use: No   Allergies Patient has no known allergies.  Review of Systems Review of Systems  All other systems reviewed and are negative.   Physical Exam Vital Signs  I have reviewed the triage vital signs BP 111/75   Pulse 77   Temp 98.7 F (37.1 C) (Oral)   Resp (!) 22    Ht 5\' 7"  (1.702 m)   Wt 72.6 kg   SpO2 100%   BMI 25.06 kg/m   Physical Exam Constitutional:      General: He is not in acute distress.    Appearance: Normal appearance.  HENT:     Head: Normocephalic and atraumatic.     Nose: No congestion or rhinorrhea.  Eyes:     General:        Right eye: No discharge.        Left eye: No discharge.     Extraocular Movements: Extraocular movements intact.     Pupils: Pupils are equal, round, and reactive to light.  Cardiovascular:     Rate and Rhythm: Normal rate and regular rhythm.     Heart sounds: No murmur heard. Pulmonary:     Effort: No respiratory distress.     Breath sounds: No wheezing or rales.  Abdominal:     General: There is no distension.     Tenderness: There is no abdominal tenderness.  Musculoskeletal:        General: Normal range of motion.     Cervical back: Normal range of motion.  Skin:    General: Skin is warm and dry.  Neurological:     General: No focal deficit present.     Mental Status: He is alert.     ED Results and Treatments Labs (all labs ordered are listed, but only abnormal results are displayed) Labs Reviewed  COMPREHENSIVE METABOLIC PANEL - Abnormal; Notable for the following components:      Result Value   Creatinine, Ser 1.38 (*)    ALT 49 (*)    Total Bilirubin 1.3 (*)    GFR, Estimated 56 (*)    All other components within normal limits  CBC WITH DIFFERENTIAL/PLATELET - Abnormal; Notable for the following components:   WBC 3.5 (*)    RBC 3.36 (*)    Hemoglobin 12.9 (*)    HCT 35.0 (*)    MCV 104.2 (*)    MCH 38.4 (*)    MCHC 36.9 (*)    Platelets 120 (*)    Neutro Abs 1.5 (*)    All other components within normal limits  TROPONIN I (HIGH SENSITIVITY) - Abnormal; Notable for the following components:   Troponin I (High Sensitivity) 28 (*)    All other components within normal limits  Radiology No results found.  Pertinent labs & imaging results that were available during my care of the patient were reviewed by me and considered in my medical decision making (see MDM for details).  Medications Ordered in ED Medications - No data to display                                                                                                                                   Procedures Procedures  (including critical care time)  Medical Decision Making / ED Course   This patient presents to the ED for concern of arrhythmia, rapid heart rate, this involves an extensive number of treatment options, and is a complaint that carries with it a high risk of complications and morbidity.  The differential diagnosis includes SVT, A-fib with RVR, dehydration, electrolyte abnormality  MDM: Patient seen emergency room for evaluation of rapid heart rate.  Physical exam is unremarkable here in the emergency department today.  Pulse rates are normal.  Laboratory evaluation with a mild leukopenia to 3.5, hemoglobin 12.9, creatinine 1.38, high-sensitivity troponin minimally elevated at 28 likely demand ischemia in the setting of recent SVT.  ECG does not show any evidence of dysrhythmia other than some intermittent PACs.  Spoke with cardiology on-call who is recommending outpatient follow-up for discussion of ablation.  I placed an outpatient referral to his cardiology team but at this time he does not meet inpatient criteria for admission and will be discharged with outpatient follow-up   Additional history obtained: -Additional history obtained from multiple family members -External records from outside source obtained and reviewed including: Chart review including previous notes, labs, imaging, consultation notes   Lab Tests: -I ordered, reviewed, and interpreted labs.   The pertinent results include:   Labs Reviewed  COMPREHENSIVE METABOLIC PANEL - Abnormal;  Notable for the following components:      Result Value   Creatinine, Ser 1.38 (*)    ALT 49 (*)    Total Bilirubin 1.3 (*)    GFR, Estimated 56 (*)    All other components within normal limits  CBC WITH DIFFERENTIAL/PLATELET - Abnormal; Notable for the following components:   WBC 3.5 (*)    RBC 3.36 (*)    Hemoglobin 12.9 (*)    HCT 35.0 (*)    MCV 104.2 (*)    MCH 38.4 (*)    MCHC 36.9 (*)    Platelets 120 (*)    Neutro Abs 1.5 (*)    All other components within normal limits  TROPONIN I (HIGH SENSITIVITY) - Abnormal; Notable for the following components:   Troponin I (High Sensitivity) 28 (*)    All other components within normal limits      EKG   EKG Interpretation Date/Time:  Friday September 11 2023 10:29:54 EDT Ventricular Rate:  62 PR Interval:  163 QRS Duration:  82 QT Interval:  383 QTC Calculation: 389 R Axis:  17  Text Interpretation: Sinus rhythm Atrial premature complex Probable left atrial enlargement Confirmed by Kirat Mezquita ([id number]) on 09/11/2023 11:59:16 AM         Medicines ordered and prescription drug management: No orders of the defined types were placed in this encounter.   -I have reviewed the patients home medicines and have made adjustments as needed  Critical interventions none  Consultations Obtained: I requested consultation with the cardiologist on-call,  and discussed lab and imaging findings as well as pertinent plan - they recommend: Outpatient follow-up   Cardiac Monitoring: The patient was maintained on a cardiac monitor.  I personally viewed and interpreted the cardiac monitored which showed an underlying rhythm of: NSR  Social Determinants of Health:  Factors impacting patients care include: none   Reevaluation: After the interventions noted above, I reevaluated the patient and found that they have :stayed the same  Co morbidities that complicate the patient evaluation  Past Medical History:  Diagnosis Date   Anemia     Benign localized prostatic hyperplasia with lower urinary tract symptoms (LUTS)    History of alcohol abuse    History of chest pain    ED visit 01-17-2016 dx unspecified chest pain, GERD, hyponatremia   History of supraventricular tachycardia    ED visit 12-15-2015;  per d/c note by dr Johney Frame (cardiology) to follow up as outpt for ETT and Echo   History of syncope    ED visit 01-27-2017 w/ LOC-- situational (bowel movement) and vasovagal   Schizophrenia, chronic condition (HCC)       Dispostion: I considered admission for this patient, but at this time he does not meet inpatient criteria for admission and will be discharged with outpatient follow-up     Final Clinical Impression(s) / ED Diagnoses Final diagnoses:  None     @PCDICTATION @    Glendora Score, MD 09/11/23 2225

## 2023-09-11 NOTE — ED Triage Notes (Signed)
 Pt BIB by [facility] from PCP office for run of SVT. Pt was being seen for a routine check. Pt asymptomatic on EMS arrival and SB with HR 50-60s. Pt denies any CP or SHOB. Hx of Afib, on Cardizem.

## 2023-09-18 ENCOUNTER — Emergency Department (HOSPITAL_COMMUNITY): Admission: EM | Admit: 2023-09-18 | Discharge: 2023-09-18 | Disposition: A | Discharge status: Home / Self Care

## 2023-09-18 ENCOUNTER — Emergency Department (HOSPITAL_COMMUNITY)

## 2023-09-18 DIAGNOSIS — R140 Abdominal distension (gaseous): Secondary | ICD-10-CM | POA: Diagnosis not present

## 2023-09-18 DIAGNOSIS — I471 Supraventricular tachycardia, unspecified: Secondary | ICD-10-CM | POA: Insufficient documentation

## 2023-09-18 DIAGNOSIS — I4510 Unspecified right bundle-branch block: Secondary | ICD-10-CM | POA: Diagnosis not present

## 2023-09-18 DIAGNOSIS — R000 Tachycardia, unspecified: Secondary | ICD-10-CM | POA: Diagnosis not present

## 2023-09-18 LAB — COMPREHENSIVE METABOLIC PANEL
ALT: 23 U/L (ref 0–44)
AST: 22 U/L (ref 15–41)
Albumin: 4.4 g/dL (ref 3.5–5.0)
Alkaline Phosphatase: 54 U/L (ref 38–126)
Anion gap: 7 (ref 5–15)
BUN: 14 mg/dL (ref 8–23)
CO2: 29 mmol/L (ref 22–32)
Calcium: 9.7 mg/dL (ref 8.9–10.3)
Chloride: 103 mmol/L (ref 98–111)
Creatinine, Ser: 1.21 mg/dL (ref 0.61–1.24)
GFR, Estimated: >60 mL/min (ref >60)
Glucose, Bld: 171 mg/dL — ABNORMAL HIGH (ref 70–99)
Potassium: 4.2 mmol/L (ref 3.5–5.1)
Sodium: 139 mmol/L (ref 135–145)
Total Bilirubin: 1.4 mg/dL — ABNORMAL HIGH (ref 0.0–1.2)
Total Protein: 7.4 g/dL (ref 6.5–8.1)

## 2023-09-18 LAB — CBC
HCT: 41.1 % (ref 39.0–52.0)
Hemoglobin: 13.8 g/dL (ref 13.0–17.0)
MCH: 34.9 pg — ABNORMAL HIGH (ref 26.0–34.0)
MCHC: 33.6 g/dL (ref 30.0–36.0)
MCV: 104.1 fL — ABNORMAL HIGH (ref 80.0–100.0)
Platelets: 154 10*3/uL (ref 150–400)
RBC: 3.95 MIL/uL — ABNORMAL LOW (ref 4.22–5.81)
RDW: 13.6 % (ref 11.5–15.5)
WBC: 3.3 10*3/uL — ABNORMAL LOW (ref 4.0–10.5)
nRBC: 0 % (ref 0.0–0.2)

## 2023-09-18 LAB — TROPONIN I (HIGH SENSITIVITY): Troponin I (High Sensitivity): 29 ng/L — ABNORMAL HIGH (ref <18)

## 2023-09-18 LAB — MAGNESIUM: Magnesium: 2.4 mg/dL (ref 1.7–2.4)

## 2023-09-18 MED ORDER — DILTIAZEM HCL-DEXTROSE 125-5 MG/125ML-% IV SOLN (PREMIX)
5.0000 mg/h | INTRAVENOUS | Status: DC
  Administered 2023-09-18: 5 mg/h via INTRAVENOUS
  Filled 2023-09-18: qty 125

## 2023-09-18 MED ORDER — DILTIAZEM HCL 30 MG PO TABS: 30.0000 mg | ORAL_TABLET | Freq: Once | ORAL | 0 refills | Status: DC | PRN

## 2023-09-18 MED ORDER — DILTIAZEM HCL 30 MG PO TABS: 30.0000 mg | ORAL_TABLET | Freq: Once | ORAL | Status: DC | PRN

## 2023-09-18 MED ORDER — DILTIAZEM LOAD VIA INFUSION
10.0000 mg | Freq: Once | INTRAVENOUS | Status: AC
  Administered 2023-09-18: 10 mg via INTRAVENOUS
  Filled 2023-09-18: qty 10

## 2023-09-18 MED ORDER — ADENOSINE 6 MG/2ML IV SOLN
6.0000 mg | Freq: Once | INTRAVENOUS | Status: AC
  Administered 2023-09-18: 6 mg via INTRAVENOUS
  Filled 2023-09-18: qty 2

## 2023-09-18 NOTE — ED Provider Notes (Addendum)
 [place] EMERGENCY DEPARTMENT AT [facility] Provider Note   CSN: [id number] Arrival date & time: 09/18/23  8295     History  No chief complaint on file.  HPI Bernadette Armijo is a 68 y.o. male with recurrent SVT, schizophrenia presenting for tachycardia.  Was brought in by family member for elevated heart rate.  Heart rate in triage was found to be 206.  During initial encounter patient denies chest pain, shortness of breath and palpitations.  He did report that he felt palpitations this past week but cannot identify when.  He denies cough and fever. Denies excessive EtOH.  Per his sister, she states that his heart rate has steadily been increasing over the last few days.  They tried the oral diltiazem at home but did not seem to be helping.  HPI     Home Medications Prior to Admission medications   Medication Sig Start Date End Date Taking? Authorizing Provider  atorvastatin (LIPITOR) 10 MG tablet Take 10 mg by mouth daily. 06/05/20   [provider]  Cyanocobalamin (VITAMIN B-12 PO) Take 1 tablet by mouth daily.    [provider]  diltiazem (CARDIZEM) 30 MG tablet Take 1 tablet (30 mg total) by mouth once as needed for up to 1 dose (rapid heart rate). 09/18/23   Gareth Eagle, PA-C  ferrous sulfate 325 (65 FE) MG EC tablet Take 325 mg by mouth daily with breakfast.    [provider]  Multiple Vitamin (MULTIVITAMIN ADULT) TABS Take 1 tablet by mouth daily.    [provider]  Omega-3 Fatty Acids (FISH OIL PO) Take 1 capsule by mouth daily.    [provider]  oseltamivir (TAMIFLU) 30 MG capsule Take 1 capsule (30 mg total) by mouth every 12 (twelve) hours. 08/14/23   Laurence Spates, MD  risperiDONE (RISPERDAL) 2 MG tablet Take 2 mg by mouth 2 (two) times daily.    [provider]      Allergies    Patient has no known allergies.    Review of Systems   See HPI  Physical Exam Updated Vital Signs BP 123/64    Pulse 75   Temp 97.8 F (36.6 C) (Oral)   Resp 15   SpO2 100%  Physical Exam Vitals and nursing note reviewed.  HENT:     Head: Normocephalic and atraumatic.     Mouth/Throat:     Mouth: Mucous membranes are moist.  Eyes:     General:        Right eye: No discharge.        Left eye: No discharge.     Conjunctiva/sclera: Conjunctivae normal.  Cardiovascular:     Rate and Rhythm: Regular rhythm. Tachycardia present.     Pulses:          Carotid pulses are 1+ on the right side and 1+ on the left side.    Heart sounds: Normal heart sounds.  Pulmonary:     Effort: Pulmonary effort is normal.     Breath sounds: Normal breath sounds.  Abdominal:     General: Abdomen is flat.     Palpations: Abdomen is soft.  Skin:    General: Skin is warm and dry.  Neurological:     General: No focal deficit present.  Psychiatric:        Mood and Affect: Mood normal.     ED Results / Procedures / Treatments   Labs (all labs ordered are listed, but only  abnormal results are displayed) Labs Reviewed  CBC - Abnormal; Notable for the following components:      Result Value   WBC 3.3 (*)    RBC 3.95 (*)    MCV 104.1 (*)    MCH 34.9 (*)    All other components within normal limits  COMPREHENSIVE METABOLIC PANEL - Abnormal; Notable for the following components:   Glucose, Bld 171 (*)    Total Bilirubin 1.4 (*)    All other components within normal limits  TROPONIN I (HIGH SENSITIVITY) - Abnormal; Notable for the following components:   Troponin I (High Sensitivity) 29 (*)    All other components within normal limits  MAGNESIUM    EKG EKG Interpretation Date/Time:  Friday September 18 2023 09:49:04 EDT Ventricular Rate:  216 PR Interval:    QRS Duration:  110 QT Interval:  216 QTC Calculation: 409 R Axis:   264  Text Interpretation: Supraventricular tachycardia Right bundle branch block T wave abnormality, consider inferior ischemia Abnormal ECG When compared with ECG of 11-Sep-2023  10:29, PREVIOUS ECG IS PRESENT Confirmed by Estanislado Pandy [id number]) on 09/18/2023 10:36:11 AM  Radiology DG Chest Port 1 View Result Date: 09/18/2023 CLINICAL DATA:  68 year old male with tachycardia. EXAM: PORTABLE CHEST 1 VIEW COMPARISON:  Portable chest 08/14/2023 and earlier. FINDINGS: Portable AP semi upright view at 1028 hours. Left chest pacer or resuscitation pads. Lung volumes and mediastinal contours are within normal limits. Visualized tracheal air column is within normal limits. Allowing for portable technique the lungs are clear. No pneumothorax or pleural effusion. No acute osseous abnormality identified. Paucity of bowel gas. IMPRESSION: Negative portable chest. Electronically Signed   By: Odessa Fleming M.D.   On: 09/18/2023 10:43    Procedures .Critical Care  Performed by: Gareth Eagle, PA-C Authorized by: Gareth Eagle, PA-C   Critical care provider statement:    Critical care time (minutes):  30   Critical care was necessary to treat or prevent imminent or life-threatening deterioration of the following conditions: SVT requiring IV adenosine and subsequent IV diltiazem bolus and infusion.   Critical care was time spent personally by me on the following activities:  Development of treatment plan with patient or surrogate, discussions with consultants, evaluation of patient's response to treatment, examination of patient, ordering and review of laboratory studies, ordering and review of radiographic studies, ordering and performing treatments and interventions, pulse oximetry, re-evaluation of patient's condition and review of old charts     Medications Ordered in ED Medications  diltiazem (CARDIZEM) tablet 30 mg (has no administration in time range)  adenosine (ADENOCARD) 6 MG/2ML injection 6 mg (6 mg Intravenous Given 09/18/23 1017)  diltiazem (CARDIZEM) 1 mg/mL load via infusion 10 mg (10 mg Intravenous Bolus from Bag 09/18/23 1040)    ED Course/ Medical Decision Making/  A&P Clinical Course as of 09/18/23 1233  Fri Sep 18, 2023  8929 68 year old with history of SVT presented for palpitations.  Currently denies symptoms.  EKG appears to be SVT with a rate of 216.  He is hemodynamically stable.  Vagal maneuvers by PA failed.  Will trial adenosine. If fails, will give diltiazem as he has had success in the past.  [TY]    Clinical Course User Index [TY] Coral Spikes, DO                                 Medical Decision Making Amount  and/or Complexity of Data Reviewed Labs: ordered. Radiology: ordered.  Risk Prescription drug management.   Initial Impression and Ddx 68 year old well-appearing male presenting for tachycardia.  Exam revealed initial heart rate of 206 but otherwise unremarkable.  Initial concern was SVT.  Initially attempted vagal maneuvers which were unsuccessful.  Pushed 6 mg of IV adenosine and did convert to a slower rhythm for a few seconds but quickly returned to a heart rate of low 200s.  Initiated diltiazem bolus and infusion.  Ddx includes SVT, A-fib, other arrhythmia, PE, ACS, CHF exacerbation, other. Patient PMH that increases complexity of ED encounter:  recurrent SVT, schizophrenia   Interpretation of Diagnostics - I independent reviewed and interpreted the labs as followed: Elevated troponin (near baseline), mild hyperperglycemia  - I independently visualized the following imaging with scope of interpretation limited to determining acute life threatening conditions related to emergency care: CXR, which revealed acute derangement  -I personally reviewed and interpreted EKG which revealed SVT  Patient Reassessment and Ultimate Disposition/Management On reassessment after initiation of diltiazem, patient had converted back to normal sinus rhythm with a heart rate in the 80s.  Discontinued  Cardizem infusion and gave him  dose of his home dose of diltiazem.  He remained chest pain-free.  On second reassessment, patient remained  chest pain-free and heart rate was in the 60s and he was resting comfortably in the bed.  Suspect elevated troponin is likely secondary to demand.  Given that patient has remained asymptomatic throughout encounter and converted with diltiazem bolus and is remained in normal sinus rhythm for a couple of hours, feel he is appropriate to discharge.  Advised him to follow-up with his cardiologist.  Sent a refill of his p.o. diltiazem to his pharmacy.  Discussed return precautions.  Discharged good condition.  Patient management required discussion with the following services or consulting groups:  None  Complexity of Problems Addressed Acute complicated illness or Injury  Additional Data Reviewed and Analyzed Further history obtained from: Past medical history and medications listed in the EMR and Prior ED visit notes  Patient Encounter Risk Assessment None         Final Clinical Impression(s) / ED Diagnoses Final diagnoses:  SVT (supraventricular tachycardia) (HCC)    Rx / DC Orders ED Discharge Orders          Ordered    diltiazem (CARDIZEM) 30 MG tablet  Once PRN        09/18/23 1231                 Gareth Eagle, PA-C 09/18/23 1236    Coral Spikes, DO 09/18/23 1453

## 2023-09-18 NOTE — ED Notes (Signed)
 Pt understood d/c instructions and when to return to the ED. IV d/c and pts sister was called to pick pt up. Prescriptions were reviewed

## 2023-09-18 NOTE — Discharge Instructions (Signed)
 Eval patient today was overall reassuring.  Workup today revealed that you were in SVT.  You responded well to IV diltiazem and have successfully converted to normal sinus rhythm.  Please follow-up with your cardiologist.  If you develop chest pain, shortness of breath or palpitations at home please return emergency department for further evaluation.  I sent in a fill of your oral diltiazem to your pharmacy.

## 2023-09-19 ENCOUNTER — Other Ambulatory Visit: Payer: Self-pay

## 2023-09-19 ENCOUNTER — Encounter (HOSPITAL_COMMUNITY): Payer: Self-pay

## 2023-09-19 ENCOUNTER — Emergency Department (HOSPITAL_COMMUNITY)
Admission: EM | Admit: 2023-09-19 | Discharge: 2023-09-19 | Disposition: A | Discharge status: Home / Self Care | Attending: Emergency Medicine | Admitting: Emergency Medicine

## 2023-09-19 DIAGNOSIS — I471 Supraventricular tachycardia, unspecified: Secondary | ICD-10-CM | POA: Diagnosis not present

## 2023-09-19 DIAGNOSIS — R000 Tachycardia, unspecified: Secondary | ICD-10-CM | POA: Diagnosis present

## 2023-09-19 DIAGNOSIS — R410 Disorientation, unspecified: Secondary | ICD-10-CM | POA: Diagnosis not present

## 2023-09-19 DIAGNOSIS — R6889 Other general symptoms and signs: Secondary | ICD-10-CM | POA: Diagnosis not present

## 2023-09-19 DIAGNOSIS — R1110 Vomiting, unspecified: Secondary | ICD-10-CM | POA: Diagnosis not present

## 2023-09-19 DIAGNOSIS — I499 Cardiac arrhythmia, unspecified: Secondary | ICD-10-CM | POA: Diagnosis not present

## 2023-09-19 LAB — CBC WITH DIFFERENTIAL/PLATELET
Abs Immature Granulocytes: 0.01 10*3/uL (ref 0.00–0.07)
Basophils Absolute: 0 10*3/uL (ref 0.0–0.1)
Basophils Relative: 1 %
Eosinophils Absolute: 0 10*3/uL (ref 0.0–0.5)
Eosinophils Relative: 0 %
HCT: 38.1 % — ABNORMAL LOW (ref 39.0–52.0)
Hemoglobin: 12.8 g/dL — ABNORMAL LOW (ref 13.0–17.0)
Immature Granulocytes: 0 %
Lymphocytes Relative: 27 %
Lymphs Abs: 1.1 10*3/uL (ref 0.7–4.0)
MCH: 34.9 pg — ABNORMAL HIGH (ref 26.0–34.0)
MCHC: 33.6 g/dL (ref 30.0–36.0)
MCV: 103.8 fL — ABNORMAL HIGH (ref 80.0–100.0)
Monocytes Absolute: 0.4 10*3/uL (ref 0.1–1.0)
Monocytes Relative: 10 %
Neutro Abs: 2.4 10*3/uL (ref 1.7–7.7)
Neutrophils Relative %: 62 %
Platelets: 156 10*3/uL (ref 150–400)
RBC: 3.67 MIL/uL — ABNORMAL LOW (ref 4.22–5.81)
RDW: 13.6 % (ref 11.5–15.5)
WBC: 3.9 10*3/uL — ABNORMAL LOW (ref 4.0–10.5)
nRBC: 0 % (ref 0.0–0.2)

## 2023-09-19 LAB — BASIC METABOLIC PANEL
Anion gap: 7 (ref 5–15)
BUN: 17 mg/dL (ref 8–23)
CO2: 26 mmol/L (ref 22–32)
Calcium: 9 mg/dL (ref 8.9–10.3)
Chloride: 110 mmol/L (ref 98–111)
Creatinine, Ser: 1.15 mg/dL (ref 0.61–1.24)
GFR, Estimated: >60 mL/min (ref >60)
Glucose, Bld: 102 mg/dL — ABNORMAL HIGH (ref 70–99)
Potassium: 4.6 mmol/L (ref 3.5–5.1)
Sodium: 143 mmol/L (ref 135–145)

## 2023-09-19 LAB — MAGNESIUM: Magnesium: 2.4 mg/dL (ref 1.7–2.4)

## 2023-09-19 MED ORDER — DILTIAZEM HCL ER COATED BEADS 120 MG PO CP24: 120.0000 mg | ORAL_CAPSULE | Freq: Every day | ORAL | 1 refills | Status: DC

## 2023-09-19 NOTE — ED Provider Notes (Signed)
 [place] EMERGENCY DEPARTMENT AT [facility] Provider Note   CSN: [id number] Arrival date & time: 09/19/23  1030     History  Chief Complaint  Patient presents with   Tachycardia    Billy Boyd is a 68 y.o. male.  68 year old male presents today for concern of SVT.  He was given multiple doses of adenosine en route as well as required 10 mg of diltiazem IVP.  According to family at bedside he has had significant stress which they feel to be the trigger for these episodes.  Specially these episodes are now occurring more frequently.  He takes 30 mg of Cardizem as needed.  Currently without chest pain, shortness of breath, or other complaints.  The history is provided by the patient. No language interpreter was used.       Home Medications Prior to Admission medications   Medication Sig Start Date End Date Taking? Authorizing Provider  atorvastatin (LIPITOR) 10 MG tablet Take 10 mg by mouth daily. 06/05/20   [provider]  Cyanocobalamin (VITAMIN B-12 PO) Take 1 tablet by mouth daily.    [provider]  diltiazem (CARDIZEM) 30 MG tablet Take 1 tablet (30 mg total) by mouth once as needed for up to 1 dose (rapid heart rate). 09/18/23   Gareth Eagle, PA-C  ferrous sulfate 325 (65 FE) MG EC tablet Take 325 mg by mouth daily with breakfast.    [provider]  Multiple Vitamin (MULTIVITAMIN ADULT) TABS Take 1 tablet by mouth daily.    [provider]  Omega-3 Fatty Acids (FISH OIL PO) Take 1 capsule by mouth daily.    [provider]  oseltamivir (TAMIFLU) 30 MG capsule Take 1 capsule (30 mg total) by mouth every 12 (twelve) hours. 08/14/23   Laurence Spates, MD  risperiDONE (RISPERDAL) 2 MG tablet Take 2 mg by mouth 2 (two) times daily.    [provider]      Allergies    Patient has no known allergies.    Review of Systems   Review of Systems  Constitutional:  Negative for chills and fever.  Respiratory:   Negative for shortness of breath.   Cardiovascular:  Positive for palpitations. Negative for chest pain.  All other systems reviewed and are negative.   Physical Exam Updated Vital Signs BP 98/61   Pulse (!) 55   Temp 97.7 F (36.5 C)   Resp (!) 22   Ht 5\' 7"  (1.702 m)   Wt 72.6 kg   SpO2 100%   BMI 25.07 kg/m  Physical Exam Vitals and nursing note reviewed.  Constitutional:      General: He is not in acute distress.    Appearance: Normal appearance. He is not ill-appearing.  HENT:     Head: Normocephalic and atraumatic.     Nose: Nose normal.  Eyes:     Conjunctiva/sclera: Conjunctivae normal.  Cardiovascular:     Rate and Rhythm: Normal rate and regular rhythm.  Pulmonary:     Effort: Pulmonary effort is normal. No respiratory distress.  Musculoskeletal:        General: No deformity. Normal range of motion.     Cervical back: Normal range of motion.  Skin:    Findings: No rash.  Neurological:     Mental Status: He is alert.     ED Results / Procedures / Treatments   Labs (all labs ordered are listed, but only abnormal results are displayed) Labs Reviewed  CBC  WITH DIFFERENTIAL/PLATELET - Abnormal; Notable for the following components:      Result Value   WBC 3.9 (*)    RBC 3.67 (*)    Hemoglobin 12.8 (*)    HCT 38.1 (*)    MCV 103.8 (*)    MCH 34.9 (*)    All other components within normal limits  BASIC METABOLIC PANEL - Abnormal; Notable for the following components:   Glucose, Bld 102 (*)    All other components within normal limits  MAGNESIUM    EKG None  Radiology DG Chest Port 1 View Result Date: 09/18/2023 CLINICAL DATA:  68 year old male with tachycardia. EXAM: PORTABLE CHEST 1 VIEW COMPARISON:  Portable chest 08/14/2023 and earlier. FINDINGS: Portable AP semi upright view at 1028 hours. Left chest pacer or resuscitation pads. Lung volumes and mediastinal contours are within normal limits. Visualized tracheal air column is within normal  limits. Allowing for portable technique the lungs are clear. No pneumothorax or pleural effusion. No acute osseous abnormality identified. Paucity of bowel gas. IMPRESSION: Negative portable chest. Electronically Signed   By: Odessa Fleming M.D.   On: 09/18/2023 10:43    Procedures Procedures    Medications Ordered in ED Medications - No data to display  ED Course/ Medical Decision Making/ A&P                                 Medical Decision Making Amount and/or Complexity of Data Reviewed Labs: ordered.   Medical Decision Making / ED Course   This patient presents to the ED for concern of palpitations, this involves an extensive number of treatment options, and is a complaint that carries with it a high risk of complications and morbidity.  The differential diagnosis includes SVT, A-fib, sinus tach  MDM: 68 year old male with past medical history of SVT presents today for concern of palpitations.  He was found to be in SVT with EMS.  Given multiple rounds of medication with success of conversion to sinus rhythm ultimately.  CBC without acute concerns.  BMP with glucose of 102 otherwise unremarkable, magnesium 2.4. EKG normal sinus rhythm.  Discussed with Dr. Lalla Brothers of cardiology who recommends increasing patient's dose of diltiazem to 120 to take daily.  Will give patient cardiology referral.  Family is in agreement with this plan.  Patient discharged in stable condition.  Return precautions discussed.  Lab Tests: -I ordered, reviewed, and interpreted labs.   The pertinent results include:   Labs Reviewed  CBC WITH DIFFERENTIAL/PLATELET - Abnormal; Notable for the following components:      Result Value   WBC 3.9 (*)    RBC 3.67 (*)    Hemoglobin 12.8 (*)    HCT 38.1 (*)    MCV 103.8 (*)    MCH 34.9 (*)    All other components within normal limits  BASIC METABOLIC PANEL - Abnormal; Notable for the following components:   Glucose, Bld 102 (*)    All other components  within normal limits  MAGNESIUM      EKG  EKG Interpretation Date/Time:    Ventricular Rate:    PR Interval:    QRS Duration:    QT Interval:    QTC Calculation:   R Axis:      Text Interpretation:          Medicines ordered and prescription drug management: No orders of the defined types were placed in this encounter.   -  I have reviewed the patients home medicines and have made adjustments as needed   Consultations Obtained: I requested consultation with the cardiology,  and discussed lab and imaging findings as well as pertinent plan - they recommend: As above   Cardiac Monitoring: The patient was maintained on a cardiac monitor.  I personally viewed and interpreted the cardiac monitored which showed an underlying rhythm of: Normal sinus rhythm  Social Determinants of Health:  Factors impacting patients care include: Good family support, dementia, questionable medication compliance   Reevaluation: After the interventions noted above, I reevaluated the patient and found that they have :resolved  Co morbidities that complicate the patient evaluation  Past Medical History:  Diagnosis Date   Anemia    Benign localized prostatic hyperplasia with lower urinary tract symptoms (LUTS)    History of alcohol abuse    History of chest pain    ED visit 01-17-2016 dx unspecified chest pain, GERD, hyponatremia   History of supraventricular tachycardia    ED visit 12-15-2015;  per d/c note by dr Johney Frame (cardiology) to follow up as outpt for ETT and Echo   History of syncope    ED visit 01-27-2017 w/ LOC-- situational (bowel movement) and vasovagal   Schizophrenia, chronic condition (HCC)       Dispostion: Discharged in stable condition.  Return precaution discussed.  Patient voices understanding and is in agreement with plan.  Final Clinical Impression(s) / ED Diagnoses Final diagnoses:  SVT (supraventricular tachycardia) (HCC)    Rx / DC Orders ED Discharge  Orders          Ordered    diltiazem (CARDIZEM CD) 120 MG 24 hr capsule  Daily        09/19/23 1431    Ambulatory referral to Cardiology       Comments: If you have not heard from the Cardiology office within the next 72 hours please call [phone].   09/19/23 1432              Marita Kansas, PA-C 09/19/23 1439    Lonell Grandchild, MD 09/19/23 1537

## 2023-09-19 NOTE — ED Triage Notes (Signed)
 Patient coming from home. Patient here recently for same. Patient woke up this morning and had a run of palpitations. Patient found to be in SVT of a rate 212 for [facility]. 30mg  of adenosine 10 of cardizem given by EMS. Now patient HR is 80s

## 2023-09-19 NOTE — Discharge Instructions (Signed)
 Your blood work was overall reassuring.  I discussed with cardiologist who recommended increasing your diltiazem dose to 120 mg daily.  Follow-up with the cardiologist.  Return for any emergent symptoms.

## 2023-10-06 NOTE — Progress Notes (Unsigned)
  Electrophysiology Office Note:   Date:  10/07/2023  ID:  Billy Boyd, DOB 1956-03-01, MRN [id number]  Primary Cardiologist: None Primary Heart Failure: None Electrophysiologist: Kendelle Schweers Jorja Loa, MD      History of Present Illness:   Billy Boyd is a 68 y.o. male with h/o SVT seen today for routine electrophysiology followup.   He has had multiple ER visits for episodes of SVT.  He most recently presented to the hospital with palpitations and dizziness.  Dizziness stopped when SVT terminated and he resumed sinus rhythm.  He was given both adenosine and diltiazem which resulted in termination of tachycardia.  Since last being seen in our clinic the patient reports doing well since his last emergency room visit.  He has been switched to diltiazem 120 mg daily.  Since switching to this medication, he has had no further episodes of SVT.  When he was having episodes, he felt significant palpitations.  he denies chest pain, palpitations, dyspnea, PND, orthopnea, nausea, vomiting, dizziness, syncope, edema, weight gain, or early satiety.   Review of systems complete and found to be negative unless listed in HPI.   EP Information / Studies Reviewed:    EKG is ordered today. Personal review as below.  EKG Interpretation Date/Time:  Wednesday October 07 2023 08:29:27 EDT Ventricular Rate:  60 PR Interval:  158 QRS Duration:  80 QT Interval:  396 QTC Calculation: 396 R Axis:   39  Text Interpretation: Sinus rhythm with Premature atrial complexes Abnormal QRS-T angle, consider primary T wave abnormality When compared with ECG of 19-Sep-2023 10:42, No significant change since last tracing Confirmed by Conception Doebler ([id number]) on 10/07/2023 8:32:33 AM     Risk Assessment/Calculations:    CHA2DS2-VASc Score = 1   This indicates a 0.6% annual risk of stroke. The patient's score is based upon: CHF History: 0 HTN History: 0 Diabetes History: 0 Stroke History: 0 Vascular Disease History:  0 Age Score: 1 Gender Score: 0             Physical Exam:   VS:  BP 120/84 (BP Location: Left Arm, Patient Position: Sitting, Cuff Size: Normal)   Pulse 60   Ht 5\' 7"  (1.702 m)   Wt 165 lb (74.8 kg)   SpO2 98%   BMI 25.84 kg/m    Wt Readings from Last 3 Encounters:  10/07/23 165 lb (74.8 kg)  09/19/23 160 lb 0.9 oz (72.6 kg)  09/11/23 160 lb (72.6 kg)     GEN: Well nourished, well developed in no acute distress NECK: No JVD; No carotid bruits CARDIAC: Regular rate and rhythm, no murmurs, rubs, gallops RESPIRATORY:  Clear to auscultation without rales, wheezing or rhonchi  ABDOMEN: Soft, non-tender, non-distended EXTREMITIES:  No edema; No deformity   ASSESSMENT AND PLAN:    1.  SVT: Documented short RP tachycardia.  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  He is now on diltiazem 120 mg daily.  He has not had any further episodes of SVT since being on diltiazem.  We discussed continuing this medication versus ablation.  For now, they would like to continue his diltiazem.  If he has further episodes, ablation would be reasonable.  2.  Paroxysmal atrial fibrillation: Noted on EMS records and in the emergency room.  Minimal episodes.  Low stroke risk and thus no need for anticoagulation.  Follow up with EP APP in 3 months  Signed, Delvis Kau Jorja Loa, MD

## 2023-10-07 ENCOUNTER — Ambulatory Visit: Attending: Cardiology | Admitting: Cardiology

## 2023-10-07 ENCOUNTER — Encounter: Payer: Self-pay | Admitting: Cardiology

## 2023-10-07 VITALS — BP 120/84 | HR 60 | Ht 67.0 in | Wt 165.0 lb

## 2023-10-07 DIAGNOSIS — I480 Paroxysmal atrial fibrillation: Secondary | ICD-10-CM

## 2023-10-07 DIAGNOSIS — R001 Bradycardia, unspecified: Secondary | ICD-10-CM | POA: Diagnosis not present

## 2023-10-07 DIAGNOSIS — I471 Supraventricular tachycardia, unspecified: Secondary | ICD-10-CM | POA: Diagnosis not present

## 2023-10-07 NOTE — Patient Instructions (Signed)
 Medication Instructions:  Your physician recommends that you continue on your current medications as directed. Please refer to the Current Medication list given to you today.  *If you need a refill on your cardiac medications before your next appointment, please call your pharmacy*  Lab Work: None ordered.  If you have labs (blood work) drawn today and your tests are completely normal, you will receive your results only by: MyChart Message (if you have MyChart) OR A paper copy in the mail If you have any lab test that is abnormal or we need to change your treatment, we will call you to review the results.  Testing/Procedures: None ordered.   Follow-Up: At [facility], you and your health needs are our priority.  As part of our continuing mission to provide you with exceptional heart care, our providers are all part of one team.  This team includes your primary Cardiologist (physician) and Advanced Practice Providers or APPs (Physician Assistants and Nurse Practitioners) who all work together to provide you with the care you need, when you need it.  Your next appointment:   3 months with Dr Elberta Fortis PA      1st Floor: - Lobby - Registration  - Pharmacy  - Lab - Cafe  2nd Floor: - PV Lab - Diagnostic Testing (echo, CT, nuclear med)  3rd Floor: - Vacant  4th Floor: - TCTS (cardiothoracic surgery) - AFib Clinic - Structural Heart Clinic - Vascular Surgery  - Vascular Ultrasound  5th Floor: - HeartCare Cardiology (general and EP) - Clinical Pharmacy for coumadin, hypertension, lipid, weight-loss medications, and med management appointments    Valet parking services will be available as well.

## 2023-10-12 ENCOUNTER — Ambulatory Visit: Payer: Medicare Other | Admitting: Cardiology

## 2023-11-18 DIAGNOSIS — I471 Supraventricular tachycardia, unspecified: Secondary | ICD-10-CM | POA: Diagnosis not present

## 2023-11-18 DIAGNOSIS — D696 Thrombocytopenia, unspecified: Secondary | ICD-10-CM | POA: Diagnosis not present

## 2023-11-18 DIAGNOSIS — R413 Other amnesia: Secondary | ICD-10-CM | POA: Diagnosis not present

## 2023-11-26 ENCOUNTER — Encounter: Payer: Self-pay | Admitting: Physician Assistant

## 2023-11-26 ENCOUNTER — Other Ambulatory Visit

## 2023-11-26 ENCOUNTER — Ambulatory Visit: Admitting: Physician Assistant

## 2023-11-26 ENCOUNTER — Ambulatory Visit

## 2023-11-26 VITALS — BP 72/48 | HR 69 | Resp 20 | Ht 67.0 in | Wt 170.0 lb

## 2023-11-26 DIAGNOSIS — R413 Other amnesia: Secondary | ICD-10-CM | POA: Diagnosis not present

## 2023-11-26 LAB — VITAMIN B12: Vitamin B-12: 574 pg/mL (ref 200–1100)

## 2023-11-26 NOTE — Patient Instructions (Signed)
 It was a pleasure to see you today at our office.   Recommendations:   MRI of the brain, the radiology office will call you to arrange you appointment   Check labs today   Follow up in 4 months    For assessment of decision of mental capacity and competency:  Call Dr. Laverne Potter, geriatric psychiatrist at [phone] Counseling regarding caregiver distress, including caregiver depression, anxiety and issues regarding community resources, adult day care programs, adult living facilities, or memory care questions:  please contact your  Primary Doctor's Social Worker   FOR Memory  decline, memory medications: Call our office [phone]    [email or web address]   RECOMMENDATIONS FOR ALL PATIENTS WITH MEMORY PROBLEMS: 1. Continue to exercise (Recommend 30 minutes of walking everyday, or 3 hours every week) 2. Increase social interactions - continue going to [place] and enjoy social gatherings with friends and family 3. Eat healthy, avoid fried foods and eat more fruits and vegetables 4. Maintain adequate blood pressure, blood sugar, and blood cholesterol level. Reducing the risk of stroke and cardiovascular disease also helps promoting better memory. 5. Avoid stressful situations. Live a simple life and avoid aggravations. Organize your time and prepare for the next day in anticipation. 6. Sleep well, avoid any interruptions of sleep and avoid any distractions in the bedroom that may interfere with adequate sleep quality 7. Avoid sugar, avoid sweets as there is a strong link between excessive sugar intake, diabetes, and cognitive impairment We discussed the Mediterranean diet, which has been shown to help patients reduce the risk of progressive memory disorders and reduces cardiovascular risk. This includes eating fish, eat fruits and green leafy vegetables, nuts like almonds and hazelnuts, walnuts, and also use olive oil. Avoid fast  foods and fried foods as much as possible. Avoid sweets and sugar as sugar use has been linked to worsening of memory function.  There is always a concern of gradual progression of memory problems. If this is the case, then we may need to adjust level of care according to patient needs. Support, both to the patient and caregiver, should then be put into place.       FALL PRECAUTIONS: Be cautious when walking. Scan the area for obstacles that may increase the risk of trips and falls. When getting up in the mornings, sit up at the edge of the bed for a few minutes before getting out of bed. Consider elevating the bed at the head end to avoid drop of blood pressure when getting up. Walk always in a well-lit room (use night lights in the walls). Avoid area rugs or power cords from appliances in the middle of the walkways. Use a walker or a cane if necessary and consider physical therapy for balance exercise. Get your eyesight checked regularly.  FINANCIAL OVERSIGHT: Supervision, especially oversight when making financial decisions or transactions is also recommended.  HOME SAFETY: Consider the safety of the kitchen when operating appliances like stoves, microwave oven, and blender. Consider having supervision and share cooking responsibilities until no longer able to participate in those. Accidents with firearms and other hazards in the house should be identified and addressed as well.   ABILITY TO BE LEFT ALONE: If patient is unable to contact 911 operator, consider using LifeLine, or when the need is there, arrange for someone to stay with patients. Smoking is a fire hazard, consider supervision or cessation. Risk of wandering should be assessed by caregiver and if detected at any point, supervision and safe proof  recommendations should be instituted.  MEDICATION SUPERVISION: Inability to self-administer medication needs to be constantly addressed. Implement a mechanism to ensure safe administration of  the medications.      Mediterranean Diet A Mediterranean diet refers to food and lifestyle choices that are based on the traditions of countries located on the [place]. This way of eating has been shown to help prevent certain conditions and improve outcomes for people who have chronic diseases, like kidney disease and heart disease. What are tips for following this plan? Lifestyle  Cook and eat meals together with your family, when possible. Drink enough fluid to keep your urine clear or pale yellow. Be physically active every day. This includes: Aerobic exercise like running or swimming. Leisure activities like gardening, walking, or housework. Get 7-8 hours of sleep each night. If recommended by your health care provider, drink red wine in moderation. This means 1 glass a day for nonpregnant women and 2 glasses a day for men. A glass of wine equals 5 oz (150 mL). Reading food labels  Check the serving size of packaged foods. For foods such as rice and pasta, the serving size refers to the amount of cooked product, not dry. Check the total fat in packaged foods. Avoid foods that have saturated fat or trans fats. Check the ingredients list for added sugars, such as corn syrup. Shopping  At the grocery store, buy most of your food from the areas near the walls of the store. This includes: Fresh fruits and vegetables (produce). Grains, beans, nuts, and seeds. Some of these may be available in unpackaged forms or large amounts (in bulk). Fresh seafood. Poultry and eggs. Low-fat dairy products. Buy whole ingredients instead of prepackaged foods. Buy fresh fruits and vegetables in-season from local farmers markets. Buy frozen fruits and vegetables in resealable bags. If you do not have access to quality fresh seafood, buy precooked frozen shrimp or canned fish, such as tuna, salmon, or sardines. Buy small amounts of raw or cooked vegetables, salads, or olives from the deli or  salad bar at your store. Stock your pantry so you always have certain foods on hand, such as olive oil, canned tuna, canned tomatoes, rice, pasta, and beans. Cooking  Cook foods with extra-virgin olive oil instead of using butter or other vegetable oils. Have meat as a side dish, and have vegetables or grains as your main dish. This means having meat in small portions or adding small amounts of meat to foods like pasta or stew. Use beans or vegetables instead of meat in common dishes like chili or lasagna. Experiment with different cooking methods. Try roasting or broiling vegetables instead of steaming or sauteing them. Add frozen vegetables to soups, stews, pasta, or rice. Add nuts or seeds for added healthy fat at each meal. You can add these to yogurt, salads, or vegetable dishes. Marinate fish or vegetables using olive oil, lemon juice, garlic, and fresh herbs. Meal planning  Plan to eat 1 vegetarian meal one day each week. Try to work up to 2 vegetarian meals, if possible. Eat seafood 2 or more times a week. Have healthy snacks readily available, such as: Vegetable sticks with hummus. Greek yogurt. Fruit and nut trail mix. Eat balanced meals throughout the week. This includes: Fruit: 2-3 servings a day Vegetables: 4-5 servings a day Low-fat dairy: 2 servings a day Fish, poultry, or lean meat: 1 serving a day Beans and legumes: 2 or more servings a week Nuts and seeds: 1-2 servings a  day Whole grains: 6-8 servings a day Extra-virgin olive oil: 3-4 servings a day Limit red meat and sweets to only a few servings a month What are my food choices? Mediterranean diet Recommended Grains: Whole-grain pasta. Brown rice. Bulgar wheat. Polenta. Couscous. Whole-wheat bread. Dwyane Glad. Vegetables: Artichokes. Beets. Broccoli. Cabbage. Carrots. Eggplant. Green beans. Chard. Kale. Spinach. Onions. Leeks. Peas. Squash. Tomatoes. Peppers. Radishes. Fruits: Apples. Apricots. Avocado.  Berries. Bananas. Cherries. Dates. Figs. Grapes. Lemons. Melon. Oranges. Peaches. Plums. Pomegranate. Meats and other protein foods: Beans. Almonds. Sunflower seeds. Pine nuts. Peanuts. Cod. Salmon. Scallops. Shrimp. Tuna. Tilapia. Clams. Oysters. Eggs. Dairy: Low-fat milk. Cheese. Greek yogurt. Beverages: Water. Red wine. Herbal tea. Fats and oils: Extra virgin olive oil. Avocado oil. Grape seed oil. Sweets and desserts: [place] yogurt with honey. Baked apples. Poached pears. Trail mix. Seasoning and other foods: Basil. Cilantro. Coriander. Cumin. Mint. Parsley. Sage. Rosemary. Tarragon. Garlic. Oregano. Thyme. Pepper. Balsalmic vinegar. Tahini. Hummus. Tomato sauce. Olives. Mushrooms. Limit these Grains: Prepackaged pasta or rice dishes. Prepackaged cereal with added sugar. Vegetables: Deep fried potatoes (french fries). Fruits: Fruit canned in syrup. Meats and other protein foods: Beef. Pork. Lamb. Poultry with skin. Hot dogs. Helene Loader. Dairy: Ice cream. Sour cream. Whole milk. Beverages: Juice. Sugar-sweetened soft drinks. Beer. Liquor and spirits. Fats and oils: Butter. Canola oil. Vegetable oil. Beef fat (tallow). Lard. Sweets and desserts: Cookies. Cakes. Pies. Candy. Seasoning and other foods: Mayonnaise. Premade sauces and marinades. The items listed may not be a complete list. Talk with your dietitian about what dietary choices are right for you. Summary The Mediterranean diet includes both food and lifestyle choices. Eat a variety of fresh fruits and vegetables, beans, nuts, seeds, and whole grains. Limit the amount of red meat and sweets that you eat. Talk with your health care provider about whether it is safe for you to drink red wine in moderation. This means 1 glass a day for nonpregnant women and 2 glasses a day for men. A glass of wine equals 5 oz (150 mL). This information is not intended to replace advice given to you by your health care provider. Make sure you discuss any  questions you have with your health care provider. Document Released: 02/07/2016 Document Revised: 03/11/2016 Document Reviewed: 02/07/2016 Elsevier Interactive Patient Education  2017 [employer].

## 2023-11-26 NOTE — Progress Notes (Signed)
 Assessment/Plan:   Billy Boyd is a very pleasant 68 y.o. year old RH male with a history of hypertension, prediabetes, hyperlipidemia, SVT, macrocytic anemia (MCV 103), GERD, seen today for evaluation of memory loss. MoCA today is 11/30. Etiology of memory loss is unclear, workup is in progress.  Memory Impairment of unclear etiology  MRI brain without contrast to assess for underlying structural abnormality and assess vascular load  Check B12  Continue to control mood as per PCP Recommend good control of cardiovascular risk factors. He was informed to have very low BP, although he is asymptomatic for it. Folllow up 4 months   Subjective:   The patient is accompanied by his 2 sisters who supplement the history.   How long did patient have memory difficulties? "For the last 1.5 years"-he says. Sisters noticed memory issues for the last 4 months. Reports some difficulty remembering new information, conversations and names. There are times that he just does not engage in conversation. 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 He does Development worker, international aid. Likes to go to [employer]. Enjoys word puzzles and even made his own puzzles.  repeats oneself?  Endorsed Disoriented when walking into a room?  Patient denies.  Leaving objects in unusual places? Denies.   Wandering behavior?  denies .  Any personality changes?  Denies.  "He is quieter than he normally would be"-sisters say Any history of depression?:  He has a history of schizophrenia, but "he is nice and sweet"-sister says.   Hallucinations or paranoia? He hears things from a male voice sometimes, telling him"come back and talk to me". Denies visual hallucinations. He has a therapist at [facility] whom he sees every 3 months  Seizures?  Denies    Any sleep changes?   Sleeps well. Denies vivid dreams, REM behavior or sleepwalking   Sleep apnea?  Denies   Any  hygiene concerns?  Denies. Occasionally he needs to be reminded to bathe.   Independent of bathing and dressing?  Endorsed  Does the patient needs help with medications? Patient is in charge   Who is in charge of the finances? Patient is in charge     Any changes in appetite?  Denies     Patient have trouble swallowing? Denies.   Does the patient cook? No      Any kitchen accidents such as leaving the stove on? Denies.   Any history of headaches?   Denies.   Chronic pain ? Denies.   Ambulates with difficulty?  Denies. Likes to walk frequently.   Recent falls or head injuries? 50 years ago he fell of a ladder, no LOC.  Vision changes? Denies.   Any stroke like symptoms? Denies.   Any tremors?   Denies.   Any anosmia?  Denies.   Any incontinence of urine? Denies.   Any bowel dysfunction? Denies.      Patient lives with his sisters  History of heavy alcohol intake? Socially, once a month. HE is concerned that there is a noted diagnosis of alcohol abuse which he adamantly denies, so do his sisters.  History of heavy tobacco use? Denies.   Family history of dementia? Denies.  Does patient drive? Yes  He had different jobs, in the past he worked on the Nurse, adult. He works at [employer].    Past Medical History:  Diagnosis Date   Anemia    Benign localized prostatic  hyperplasia with lower urinary tract symptoms (LUTS)    History of alcohol abuse    History of chest pain    ED visit 01-17-2016 dx unspecified chest pain, GERD, hyponatremia   History of supraventricular tachycardia    ED visit 12-15-2015;  per d/c note by dr Nunzio Belch (cardiology) to follow up as outpt for ETT and Echo   History of syncope    ED visit 01-27-2017 w/ LOC-- situational (bowel movement) and vasovagal   Schizophrenia, chronic condition [facility])      Past Surgical History:  Procedure Laterality Date   TRANSURETHRAL RESECTION OF PROSTATE N/A 03/03/2019   Procedure: TRANSURETHRAL RESECTION OF THE PROSTATE  (TURP);  Surgeon: Marco Severs, MD;  [facility]: [facility];  Service: Urology;  Laterality: N/A;     No Known Allergies  Current Outpatient Medications  Medication Instructions   atorvastatin  (LIPITOR) 10 mg, Daily   Cyanocobalamin  (VITAMIN B-12 PO) 1 tablet, Daily   diltiazem  (CARDIZEM  CD) 120 mg, Oral, Daily   diltiazem  (CARDIZEM ) 30 mg, Oral, Once PRN   ferrous sulfate 325 mg, Daily with breakfast   Multiple Vitamin (MULTIVITAMIN ADULT) TABS 1 tablet, Daily   Omega-3 Fatty Acids (FISH OIL PO) 1 capsule, Daily   oseltamivir  (TAMIFLU ) 30 mg, Oral, Every 12 hours   risperiDONE  (RISPERDAL ) 2 mg, 2 times daily     VITALS:   Vitals:   11/26/23 1315  BP: (!) 72/48  Pulse: 69  Resp: 20  SpO2: 98%  Weight: 170 lb (77.1 kg)  Height: 5\' 7"  (1.702 m)      PHYSICAL EXAM   HEENT:  Normocephalic, atraumatic. The superficial temporal arteries are without ropiness or tenderness. Cardiovascular: Regular rate and rhythm. Lungs: Clear to auscultation bilaterally. Neck: There are no carotid bruits noted bilaterally.  NEUROLOGICAL:    11/26/2023    5:00 PM  Montreal Cognitive Assessment   Visuospatial/ Executive (0/5) 1  Naming (0/3) 2  Attention: Read list of digits (0/2) 1  Attention: Read list of letters (0/1) 1  Attention: Serial 7 subtraction starting at 100 (0/3) 1  Language: Repeat phrase (0/2) 1  Language : Fluency (0/1) 1  Abstraction (0/2) 0  Delayed Recall (0/5) 0  Orientation (0/6) 3  Total 11  Adjusted Score (based on education) 11        No data to display           Orientation:  Alert and oriented to person,not to  place and time. No aphasia or dysarthria. Fund of knowledge is reduced. Recent and remote memory impaired.  Attention and concentration are  reduced.  Able to name objects and unable to repeat phrases. Delayed recall 0/5 Cranial nerves: There is good facial symmetry.Flat affect. Extraocular muscles are intact and visual  fields are full to confrontational testing. Speech is fluent and clear. No tongue deviation. Hearing is intact to conversational tone. Tone: Tone is good throughout. Sensation: Sensation is intact to light touch. Vibration is intact at the bilateral big toe.  Coordination: The patient has no difficulty with RAM's or FNF bilaterally. Normal finger to nose  Motor: Strength is 5/5 in the bilateral upper and lower extremities. There is no pronator drift. There are no fasciculations noted. DTR's: Deep tendon reflexes are 2/4 bilaterally. Gait and Station: The patient is able to ambulate without difficulty The patient is able to heel toe walk. Gait is cautious and narrow. The patient is able to ambulate in a tandem fashion.  Thank you for allowing us  the opportunity to participate in the care of this nice patient. Please do not hesitate to contact us  for any questions or concerns.   Total time spent on today's visit was 60 minutes dedicated to this patient today, preparing to see patient, examining the patient, ordering tests and/or medications and counseling the patient, documenting clinical information in the EHR or other health record, independently interpreting results and communicating results to the patient/family, discussing treatment and goals, answering patient's questions and coordinating care.  Cc:  Jearldine Mina, MD  Tex Filbert 11/26/2023 5:05 PM

## 2023-11-27 ENCOUNTER — Ambulatory Visit: Payer: Self-pay | Admitting: Physician Assistant

## 2023-12-12 ENCOUNTER — Ambulatory Visit
Admission: RE | Admit: 2023-12-12 | Discharge: 2023-12-12 | Disposition: A | Discharge status: Home / Self Care | Source: Ambulatory Visit | Attending: Physician Assistant | Admitting: Physician Assistant

## 2023-12-12 DIAGNOSIS — R413 Other amnesia: Secondary | ICD-10-CM | POA: Diagnosis not present

## 2023-12-15 NOTE — Progress Notes (Signed)
 I left message to call office to discuss results at 10:36 12/15/2023

## 2023-12-15 NOTE — Telephone Encounter (Signed)
 Pt's sister called back in returning Christy's call

## 2024-01-04 NOTE — Progress Notes (Unsigned)
 Cardiology Office Note:  .   Date:  01/04/2024  ID:  Billy Boyd, DOB 1955/08/17, MRN [id number] PCP: Ransom Other, MD  [facility] Providers Cardiologist:  None Electrophysiologist:  Will Gladis Norton, MD {  History of Present Illness: .   Billy Boyd is a 68 y.o. male w/PMHx of  syncope (described a vasovagal),  schizophrenia, ETOH abuse,  SVT  He saw Dr. Kelsie 09/19/21, doing well, no reports of symptoms/complaints.  Reports hx of a short RP SVT, pt had not wanted to pursue ablation, as well as asymptomatic sinus bradycardia.  Transitioned to Dr. Norton, saw him 08/11/22, had an ER visit with his SVT treated w/adenosine /vagal maneuver (perhaps triggered by the passing of his sister the day prior) went home with PRN metoprolol . Dr. Norton noted an EKG from the ER with AFib  Given low risk score and low burden, not started on OAC.  Pt continued to want to avoid procedures.  ER visit 04/11/23, dizziness, near syncope, ? If syncope, negative orthostatics, HRs 40's-60s observed, neg Trop, d-dimer, hydrated, briefly admitted, discharged/same day  05/26/23 reported waking with tachycardia, noting elevated HRs when checking his morning vitals. Treated with adenosine  w/ reports of brief interruption of SVT >  dilt gtt with resolution of his SVT Discharged Sister reported that he had not used the PRN metoprolol   I saw him 06/02/23 He is accompanied by his younger sister that he has lived with now for 12 years The 2 ER episodes wioth SVT the only this year When she checks his vitsl, mostly HR 50's-60's, has dipped to the 40's but when he walks around and she checks again is back up. He has not had any recurrent dizzy or lightheaded spells No CP, SOB, DOE They both mention that when he has an exacerbation of hearing voices, is upsetting and has been a trigger for his SVT. He is active, and otherwise feeling well They see his psychologist soon In d/w them, they both were most  comfortable with ongoing watchful waiting  ER 08/14/23 : syncope, hypotensive, SVT 190's > IV dilt > SR + Flu A Seen by cards > PRN dilt dose increased  ER 09/11/23 > ER w/SVT > took his PRN dilt > did eventually stop 09/18/23 > ER > increased burden of SVT, in SVT > adenosine  brief slowing > dilt > SR 09/19/23 > ER w/SVT in d/w EP on call > dilt 120mg  daily  Saw Dr. Norton 10/07/23, on daily dilt > no recurrent SVTs, discussed ablation, pt/sister preferred continuing dilt  Today's visit is scheduled as a 54mo visit ROS:   He is accompanied by his sister He is doing really well Reports one short lived episode of fast rate since his last visit He is active, busy His sister reports on the move every day, eats well/healthy He denies any kind of CP, SOB, DOE No near syncope or syncope   Arrhythmia/AAD hx No AAD to date  Studies Reviewed: SABRA    EKGs not done today   04/12/23: TTE 1. Left ventricular ejection fraction, by estimation, is 60 to 65%. The  left ventricle has normal function. The left ventricle has no regional  wall motion abnormalities. Left ventricular diastolic parameters were  normal.   2. Right ventricular systolic function is normal. The right ventricular  size is normal.   3. The mitral valve is normal in structure. No evidence of mitral valve  regurgitation. No evidence of mitral stenosis.   4. The aortic valve is  tricuspid. Aortic valve regurgitation is not  visualized. Aortic valve sclerosis is present, with no evidence of aortic  valve stenosis.    TTE 2020   1. The left ventricle has normal systolic function with an ejection  fraction of 60-65%. The cavity size was normal. Left ventricular diastolic  parameters were normal.   2. The right ventricle has normal systolic function. The cavity was  normal. There is no increase in right ventricular wall thickness. Right  ventricular systolic pressure is normal with an estimated pressure of 24.2  mmHg.   3.  Left atrial size was moderately dilated.   4. Trivial pericardial effusion is present.   5. No evidence of mitral valve stenosis.   6. No stenosis of the aortic valve.   7. The aorta is normal unless otherwise noted.    Risk Assessment/Calculations:    Physical Exam:   VS:  There were no vitals taken for this visit.   Wt Readings from Last 3 Encounters:  11/26/23 170 lb (77.1 kg)  10/07/23 165 lb (74.8 kg)  09/19/23 160 lb 0.9 oz (72.6 kg)    GEN: Well nourished, well developed in no acute distress NECK: No JVD; No carotid bruits CARDIAC: *** RRR, no murmurs, rubs, gallops RESPIRATORY: *** CTA b/l without rales, wheezing or rhonchi  ABDOMEN: Soft, non-tender, non-distended EXTREMITIES: *** No edema; No deformity    ASSESSMENT AND PLAN: .    SVT Perhaps triggered by emotional/mental stressors   Paroxysmal Afib Once that we are aware of CHA2DS2Vasc remains one for age, not on OAC      Dispo: they would like to keep Q4 mo, sooner if needed    Signed, Charlies Macario Arthur, PA-C

## 2024-01-07 ENCOUNTER — Encounter: Payer: Self-pay | Admitting: Physician Assistant

## 2024-01-07 ENCOUNTER — Ambulatory Visit: Attending: Physician Assistant | Admitting: Physician Assistant

## 2024-01-07 VITALS — BP 100/58 | HR 55 | Ht 67.0 in | Wt 167.0 lb

## 2024-01-07 DIAGNOSIS — I471 Supraventricular tachycardia, unspecified: Secondary | ICD-10-CM

## 2024-01-07 DIAGNOSIS — I480 Paroxysmal atrial fibrillation: Secondary | ICD-10-CM | POA: Diagnosis not present

## 2024-01-07 MED ORDER — DILTIAZEM HCL 30 MG PO TABS: 30.0000 mg | ORAL_TABLET | Freq: Once | ORAL | 0 refills | Status: DC | PRN

## 2024-01-07 NOTE — Patient Instructions (Addendum)
 Medication Instructions:   Your physician recommends that you continue on your current medications as directed. Please refer to the Current Medication list given to you today.   *If you need a refill on your cardiac medications before your next appointment, please call your pharmacy*  Lab Work: NONE ORDERED  TODAY   If you have labs (blood work) drawn today and your tests are completely normal, you will receive your results only by: MyChart Message (if you have MyChart) OR A paper copy in the mail If you have any lab test that is abnormal or we need to change your treatment, we will call you to review the results.  Testing/Procedures: NONE ORDERED  TODAY   Follow-Up: At [facility], you and your health needs are our priority.  As part of our continuing mission to provide you with exceptional heart care, our providers are all part of one team.  This team includes your primary Cardiologist (physician) and Advanced Practice Providers or APPs (Physician Assistants and Nurse Practitioners) who all work together to provide you with the care you need, when you need it.  Your next appointment:    4 month(s) ( CONTACT  CASSIE HALL/ ANGELINE HAMMER FOR EP SCHEDULING ISSUES )   Provider:   You may see Will Cortland Ding, MD or one of the following Advanced Practice Providers on your designated Care Team:   Mertha Abrahams, [place]    We recommend signing up for the patient portal called "MyChart".  Sign up information is provided on this After Visit Summary.  MyChart is used to connect with patients for Virtual Visits (Telemedicine).  Patients are able to view lab/test results, encounter notes, upcoming appointments, etc.  Non-urgent messages can be sent to your provider as well.   To learn more about what you can do with MyChart, go to [email or web address].   Other Instructions

## 2024-02-12 DIAGNOSIS — R7303 Prediabetes: Secondary | ICD-10-CM | POA: Diagnosis not present

## 2024-02-12 DIAGNOSIS — I471 Supraventricular tachycardia, unspecified: Secondary | ICD-10-CM | POA: Diagnosis not present

## 2024-02-12 DIAGNOSIS — D696 Thrombocytopenia, unspecified: Secondary | ICD-10-CM | POA: Diagnosis not present

## 2024-02-12 DIAGNOSIS — E7800 Pure hypercholesterolemia, unspecified: Secondary | ICD-10-CM | POA: Diagnosis not present

## 2024-02-25 ENCOUNTER — Emergency Department (HOSPITAL_BASED_OUTPATIENT_CLINIC_OR_DEPARTMENT_OTHER)
Admission: EM | Admit: 2024-02-25 | Discharge: 2024-02-25 | Disposition: A | Discharge status: Home / Self Care | Source: Home / Self Care | Attending: Emergency Medicine | Admitting: Emergency Medicine

## 2024-02-25 ENCOUNTER — Encounter (HOSPITAL_COMMUNITY): Payer: Self-pay

## 2024-02-25 ENCOUNTER — Observation Stay (HOSPITAL_COMMUNITY)
Admission: EM | Admit: 2024-02-25 | Discharge: 2024-02-27 | Disposition: A | Discharge status: Home / Self Care | Attending: Emergency Medicine | Admitting: Emergency Medicine

## 2024-02-25 ENCOUNTER — Other Ambulatory Visit: Payer: Self-pay

## 2024-02-25 ENCOUNTER — Other Ambulatory Visit (HOSPITAL_COMMUNITY): Payer: Self-pay

## 2024-02-25 DIAGNOSIS — R2689 Other abnormalities of gait and mobility: Secondary | ICD-10-CM | POA: Insufficient documentation

## 2024-02-25 DIAGNOSIS — Z7901 Long term (current) use of anticoagulants: Secondary | ICD-10-CM | POA: Diagnosis not present

## 2024-02-25 DIAGNOSIS — N401 Enlarged prostate with lower urinary tract symptoms: Secondary | ICD-10-CM | POA: Diagnosis not present

## 2024-02-25 DIAGNOSIS — I499 Cardiac arrhythmia, unspecified: Secondary | ICD-10-CM | POA: Diagnosis not present

## 2024-02-25 DIAGNOSIS — R55 Syncope and collapse: Secondary | ICD-10-CM | POA: Diagnosis not present

## 2024-02-25 DIAGNOSIS — Z743 Need for continuous supervision: Secondary | ICD-10-CM | POA: Diagnosis not present

## 2024-02-25 DIAGNOSIS — D62 Acute posthemorrhagic anemia: Secondary | ICD-10-CM | POA: Diagnosis not present

## 2024-02-25 DIAGNOSIS — F0390 Unspecified dementia without behavioral disturbance: Secondary | ICD-10-CM | POA: Insufficient documentation

## 2024-02-25 DIAGNOSIS — I471 Supraventricular tachycardia, unspecified: Secondary | ICD-10-CM | POA: Diagnosis not present

## 2024-02-25 DIAGNOSIS — E785 Hyperlipidemia, unspecified: Secondary | ICD-10-CM | POA: Diagnosis not present

## 2024-02-25 DIAGNOSIS — Z79899 Other long term (current) drug therapy: Secondary | ICD-10-CM | POA: Insufficient documentation

## 2024-02-25 DIAGNOSIS — R42 Dizziness and giddiness: Secondary | ICD-10-CM | POA: Diagnosis not present

## 2024-02-25 DIAGNOSIS — I498 Other specified cardiac arrhythmias: Principal | ICD-10-CM | POA: Insufficient documentation

## 2024-02-25 DIAGNOSIS — R6889 Other general symptoms and signs: Secondary | ICD-10-CM | POA: Diagnosis not present

## 2024-02-25 DIAGNOSIS — R404 Transient alteration of awareness: Secondary | ICD-10-CM | POA: Diagnosis not present

## 2024-02-25 DIAGNOSIS — N400 Enlarged prostate without lower urinary tract symptoms: Secondary | ICD-10-CM | POA: Diagnosis present

## 2024-02-25 DIAGNOSIS — F209 Schizophrenia, unspecified: Secondary | ICD-10-CM | POA: Diagnosis not present

## 2024-02-25 DIAGNOSIS — I4891 Unspecified atrial fibrillation: Secondary | ICD-10-CM | POA: Diagnosis not present

## 2024-02-25 LAB — COMPREHENSIVE METABOLIC PANEL WITH GFR
ALT: 14 U/L (ref 0–44)
ALT: 15 U/L (ref 0–44)
AST: 22 U/L (ref 15–41)
AST: 23 U/L (ref 15–41)
Albumin: 3.6 g/dL (ref 3.5–5.0)
Albumin: 4 g/dL (ref 3.5–5.0)
Alkaline Phosphatase: 47 U/L (ref 38–126)
Alkaline Phosphatase: 47 U/L (ref 38–126)
Anion gap: 6 (ref 5–15)
Anion gap: 10 (ref 5–15)
BUN: 11 mg/dL (ref 8–23)
BUN: 11 mg/dL (ref 8–23)
CO2: 25 mmol/L (ref 22–32)
CO2: 26 mmol/L (ref 22–32)
Calcium: 9.4 mg/dL (ref 8.9–10.3)
Calcium: 9.6 mg/dL (ref 8.9–10.3)
Chloride: 111 mmol/L (ref 98–111)
Chloride: 107 mmol/L (ref 98–111)
Creatinine, Ser: 1.09 mg/dL (ref 0.61–1.24)
Creatinine, Ser: 1.21 mg/dL (ref 0.61–1.24)
GFR, Estimated: >60 mL/min (ref >60)
GFR, Estimated: >60 mL/min (ref >60)
Glucose, Bld: 86 mg/dL (ref 70–99)
Glucose, Bld: 112 mg/dL — ABNORMAL HIGH (ref 70–99)
Potassium: 4.2 mmol/L (ref 3.5–5.1)
Potassium: 4.2 mmol/L (ref 3.5–5.1)
Sodium: 142 mmol/L (ref 135–145)
Sodium: 143 mmol/L (ref 135–145)
Total Bilirubin: 1.3 mg/dL — ABNORMAL HIGH (ref 0.0–1.2)
Total Bilirubin: 1.2 mg/dL (ref 0.0–1.2)
Total Protein: 6.4 g/dL — ABNORMAL LOW (ref 6.5–8.1)
Total Protein: 7.2 g/dL (ref 6.5–8.1)

## 2024-02-25 LAB — CBC WITH DIFFERENTIAL/PLATELET
Abs Immature Granulocytes: 0.01 K/uL (ref 0.00–0.07)
Abs Immature Granulocytes: 0.01 K/uL (ref 0.00–0.07)
Basophils Absolute: 0 K/uL (ref 0.0–0.1)
Basophils Absolute: 0 K/uL (ref 0.0–0.1)
Basophils Relative: 0 %
Basophils Relative: 1 %
Eosinophils Absolute: 0 K/uL (ref 0.0–0.5)
Eosinophils Absolute: 0 K/uL (ref 0.0–0.5)
Eosinophils Relative: 0 %
Eosinophils Relative: 0 %
HCT: 36 % — ABNORMAL LOW (ref 39.0–52.0)
HCT: 36.4 % — ABNORMAL LOW (ref 39.0–52.0)
Hemoglobin: 12.2 g/dL — ABNORMAL LOW (ref 13.0–17.0)
Hemoglobin: 13 g/dL (ref 13.0–17.0)
Immature Granulocytes: 0 %
Immature Granulocytes: 0 %
Lymphocytes Relative: 19 %
Lymphocytes Relative: 24 %
Lymphs Abs: 0.9 K/uL (ref 0.7–4.0)
Lymphs Abs: 1 K/uL (ref 0.7–4.0)
MCH: 33.9 pg (ref 26.0–34.0)
MCH: 36.3 pg — ABNORMAL HIGH (ref 26.0–34.0)
MCHC: 33.9 g/dL (ref 30.0–36.0)
MCHC: 35.7 g/dL (ref 30.0–36.0)
MCV: 100 fL (ref 80.0–100.0)
MCV: 101.7 fL — ABNORMAL HIGH (ref 80.0–100.0)
Monocytes Absolute: 0.4 K/uL (ref 0.1–1.0)
Monocytes Absolute: 0.3 K/uL (ref 0.1–1.0)
Monocytes Relative: 8 %
Monocytes Relative: 8 %
Neutro Abs: 3.4 K/uL (ref 1.7–7.7)
Neutro Abs: 2.7 K/uL (ref 1.7–7.7)
Neutrophils Relative %: 73 %
Neutrophils Relative %: 67 %
Platelets: 164 K/uL (ref 150–400)
Platelets: 169 K/uL (ref 150–400)
RBC: 3.6 MIL/uL — ABNORMAL LOW (ref 4.22–5.81)
RBC: 3.58 MIL/uL — ABNORMAL LOW (ref 4.22–5.81)
RDW: 13.2 % (ref 11.5–15.5)
RDW: 13.4 % (ref 11.5–15.5)
WBC: 4.6 K/uL (ref 4.0–10.5)
WBC: 4 K/uL (ref 4.0–10.5)
nRBC: 0 % (ref 0.0–0.2)
nRBC: 0 % (ref 0.0–0.2)

## 2024-02-25 LAB — URINALYSIS, ROUTINE W REFLEX MICROSCOPIC
Bilirubin Urine: NEGATIVE
Glucose, UA: NEGATIVE mg/dL
Hgb urine dipstick: NEGATIVE
Ketones, ur: NEGATIVE mg/dL
Leukocytes,Ua: NEGATIVE
Nitrite: NEGATIVE
Protein, ur: NEGATIVE mg/dL
Specific Gravity, Urine: 1.006 (ref 1.005–1.030)
pH: 6 (ref 5.0–8.0)

## 2024-02-25 LAB — PROTIME-INR
INR: 1.2 (ref 0.8–1.2)
Prothrombin Time: 15.3 s — ABNORMAL HIGH (ref 11.4–15.2)

## 2024-02-25 MED ORDER — FLECAINIDE ACETATE 50 MG PO TABS
50.0000 mg | ORAL_TABLET | Freq: Two times a day (BID) | ORAL | 1 refills | Status: DC
  Filled 2024-02-25: qty 60, 30d supply, fill #0

## 2024-02-25 MED ORDER — DILTIAZEM HCL ER COATED BEADS 180 MG PO CP24
180.0000 mg | ORAL_CAPSULE | Freq: Every day | ORAL | 1 refills | Status: DC
  Filled 2024-02-25: qty 30, 30d supply, fill #0

## 2024-02-25 MED ORDER — AMIODARONE LOAD VIA INFUSION
150.0000 mg | Freq: Once | INTRAVENOUS | Status: AC
  Administered 2024-02-25: 150 mg via INTRAVENOUS
  Filled 2024-02-25: qty 83.34

## 2024-02-25 MED ORDER — AMIODARONE HCL IN DEXTROSE 360-4.14 MG/200ML-% IV SOLN
30.0000 mg/h | INTRAVENOUS | Status: DC
  Administered 2024-02-26: 30 mg/h via INTRAVENOUS

## 2024-02-25 MED ORDER — AMIODARONE HCL IN DEXTROSE 360-4.14 MG/200ML-% IV SOLN
60.0000 mg/h | INTRAVENOUS | Status: DC
  Administered 2024-02-25 – 2024-02-26 (×2): 60 mg/h via INTRAVENOUS
  Filled 2024-02-25 (×2): qty 200

## 2024-02-25 MED ORDER — APIXABAN 5 MG PO TABS
5.0000 mg | ORAL_TABLET | Freq: Two times a day (BID) | ORAL | 1 refills | Status: DC
  Filled 2024-02-25: qty 60, 30d supply, fill #0

## 2024-02-25 NOTE — ED Triage Notes (Signed)
 Had an episode of dizziness with near syncope. Was seen at this facility early today for the same.  Presently without complaints.

## 2024-02-25 NOTE — ED Provider Notes (Signed)
 [place] [facility] Provider Note   CSN: [id number] Arrival date & time: 02/25/24  1751   Patient presents with: No chief complaint on file.   Billy Boyd is a 68 y.o. male.   The history is provided by the patient and medical records.   68 year old male with history of SVT, A-fib, anemia, schizophrenia, history of syncopal episodes, presenting to the ED after episode of dizziness and near syncope at home.  Patient was seen in the ED this morning for same, evaluated by cardiology with suspected SVT that converted to A-fib.  Medications were adjusted by cardiology but he has not yet started those.  Upon returning home he went upstairs back to his room and once he sat down on his bed he started feeling some palpitations again, family noticed that he seemed like he was going to pass out again.  He never lost consciousness and remainder awake/alert.  Patient states currently he is not feeling dizzy.    Prior to Admission medications   Medication Sig Start Date End Date Taking? Authorizing Provider  apixaban  (ELIQUIS ) 5 MG TABS tablet Take 1 tablet (5 mg total) by mouth 2 (two) times daily. 02/25/24   Trudy Birmingham, PA-C  atorvastatin  (LIPITOR) 10 MG tablet Take 10 mg by mouth daily. 06/05/20   [provider]  Cyanocobalamin  (VITAMIN B-12 PO) Take 1 tablet by mouth daily.    [provider]  diltiazem  (CARDIZEM  CD) 180 MG 24 hr capsule Take 1 capsule (180 mg total) by mouth daily. 02/25/24   Trudy Birmingham, PA-C  diltiazem  (CARDIZEM ) 30 MG tablet Take 1 tablet (30 mg total) by mouth once as needed for up to 1 dose (rapid heart rate). 01/07/24   Ursuy, Renee Lynn, PA-C  ferrous sulfate 325 (65 FE) MG EC tablet Take 325 mg by mouth daily with breakfast.    [provider]  flecainide  (TAMBOCOR ) 50 MG tablet Take 1 tablet (50 mg total) by mouth 2 (two) times daily. 02/25/24   Williams, Evan, PA-C  Multiple Vitamin (MULTIVITAMIN ADULT)  TABS Take 1 tablet by mouth daily.    [provider]  Omega-3 Fatty Acids (FISH OIL PO) Take 1 capsule by mouth daily.    [provider]  risperiDONE  (RISPERDAL ) 2 MG tablet Take 2 mg by mouth 2 (two) times daily.    [provider]    Allergies: Patient has no known allergies.    Review of Systems  Neurological:  Positive for dizziness.  All other systems reviewed and are negative.   Updated Vital Signs BP 95/65   Pulse (!) 58   Temp 98 F (36.7 C) (Oral)   Resp 15   Ht 5' 7 (1.702 m)   Wt 74.8 kg   SpO2 100%   BMI 25.83 kg/m   Physical Exam Vitals and nursing note reviewed.  Constitutional:      Appearance: He is well-developed.  HENT:     Head: Normocephalic and atraumatic.  Eyes:     Conjunctiva/sclera: Conjunctivae normal.     Pupils: Pupils are equal, round, and reactive to light.  Cardiovascular:     Rate and Rhythm: Normal rate and regular rhythm.     Heart sounds: Normal heart sounds.     Comments: Noted to have runs of VT vs SVT on monitoring at bedside Pulmonary:     Effort: Pulmonary effort is normal.     Breath sounds: Normal breath sounds.  Abdominal:  General: Bowel sounds are normal.     Palpations: Abdomen is soft.     Tenderness: There is no guarding.     Hernia: No hernia is present.  Musculoskeletal:        General: Normal range of motion.     Cervical back: Normal range of motion.  Skin:    General: Skin is warm and dry.  Neurological:     Mental Status: He is alert and oriented to person, place, and time.     (all labs ordered are listed, but only abnormal results are displayed) Labs Reviewed  CBC WITH DIFFERENTIAL/PLATELET - Abnormal; Notable for the following components:      Result Value   RBC 3.58 (*)    HCT 36.4 (*)    MCV 101.7 (*)    MCH 36.3 (*)    All other components within normal limits  COMPREHENSIVE METABOLIC PANEL WITH GFR - Abnormal; Notable for the following components:   Glucose,  Bld 112 (*)    All other components within normal limits  MAGNESIUM    EKG: EKG Interpretation Date/Time:  Thursday February 25 2024 18:03:59 EDT Ventricular Rate:  81 PR Interval:  160 QRS Duration:  74 QT Interval:  384 QTC Calculation: 446 R Axis:   7  Text Interpretation: Sinus rhythm with Premature ventricular complexes or Fusion complexes Otherwise normal ECG When compared with ECG of 25-Feb-2024 09:25, PREVIOUS ECG IS PRESENT Confirmed by Carita Senior [phone]) on 02/25/2024 11:40:08 PM  Radiology: No results found.   Procedures   CRITICAL CARE Performed by: Olam CHRISTELLA Slocumb   Total critical care time: 45 minutes  Critical care time was exclusive of separately billable procedures and treating other patients.  Critical care was necessary to treat or prevent imminent or life-threatening deterioration.  Critical care was time spent personally by me on the following activities: development of treatment plan with patient and/or surrogate as well as nursing, discussions with consultants, evaluation of patient's response to treatment, examination of patient, obtaining history from patient or surrogate, ordering and performing treatments and interventions, ordering and review of laboratory studies, ordering and review of radiographic studies, pulse oximetry and re-evaluation of patient's condition.   Medications Ordered in the ED  amiodarone  (NEXTERONE ) 1.8 mg/mL load via infusion 150 mg (150 mg Intravenous Bolus from Bag 02/25/24 2355)    Followed by  amiodarone  (NEXTERONE  PREMIX) 360-4.14 MG/200ML-% (1.8 mg/mL) IV infusion (60 mg/hr Intravenous New Bag/Given 02/25/24 2356)    Followed by  amiodarone  (NEXTERONE  PREMIX) 360-4.14 MG/200ML-% (1.8 mg/mL) IV infusion (has no administration in time range)  apixaban  (ELIQUIS ) tablet 5 mg (5 mg Oral Given 02/26/24 0012)  sodium chloride  0.9 % bolus 1,000 mL (1,000 mLs Intravenous New Bag/Given 02/26/24 0132)                                     Medical Decision Making Amount and/or Complexity of Data Reviewed Labs: ordered. ECG/medicine tests: ordered and independent interpretation performed.  Risk Prescription drug management. Decision regarding hospitalization.   68 y.o. M here with recurrent near syncope.  Seen this AM in ED for same, cardiology evaluated and made some medication adjustments but not yet started those.  Was home for about 1 hour and had recurrent symptoms so family brought back to the ED.  Denies chest pain or SOB.  Admits to some palpitations prior to recurrent episode of dizziness PTA.  Repeat labs today reassuring.  11:37 PM On cardiac monitoring, patient observed to be having non-sustained runs of Vtach vs SVT with aberrancy on monitoring, seeming to occur every 10-15 seconds.  I suspect this may be contributing to his symptoms.   Will discuss with cardiology.  Will load with IV amiodarone .  Mg+ added to labs.  12:05 AM Spoke with cardiology, Dr. Vonda-- agrees with IV amiodarone  for now, also recommended to go head and start his eliquis  in case cardioversion needed in the next few days.  He will provide consult and team will follow along.  Agrees with medical admission for now.  Spoke with hospitalist, Dr. Segars-- will admit for ongoing care.  Final diagnoses:  Other cardiac arrhythmia    ED Discharge Orders     None          Jarold Olam HERO, PA-C 02/26/24 0149    Carita Senior, MD 02/27/24 [phone]

## 2024-02-25 NOTE — ED Triage Notes (Signed)
 Patient BIB EMS from home c/o dizziness and a near syncope event. Patient's sister states that patient became cool, clammy, and diaphoretic and then it appeared his eyes rolled into the back of his head, patient did not fall, he lowered himself onto the ground. Patient's sister checked his blood pressure and states he was hypotensive. Patient was originally 98/62 upon EMS arrival and in a-fib. Patient then went from a-fib to SVT, back into a-fib then to sinus brady and then back into a-fib with EMS. Patient does have a history of these heart rhythms.

## 2024-02-25 NOTE — Consult Note (Addendum)
 ELECTROPHYSIOLOGY CONSULT NOTE    Patient ID: Billy Boyd MRN: [id number], DOB/AGE: 02/28/1956 68 y.o.  Admit date: 02/25/2024 Date of Consult: 02/25/2024  Primary Physician: Ransom Other, MD Primary Cardiologist: None  Electrophysiologist: Dr. Inocencio   Referring Provider: Dr. Yolande  Patient Profile: Billy Boyd is a 68 y.o. male with a history of syncope (described a vasovagal),  schizophrenia, ETOH abuse, SVT, paroxysmal atrial fibrillation who is being seen today for the evaluation of tachycardia and bradycardia in the setting of atrial fibrillation at the request of Dr. Yolande.  HPI:  Billy Boyd is a 67 y.o. male with above noted medical history. He has had multiple ER visits in the past with SVT, responsive to adenosine  and diltiazem . Has recent history of paroxysmal atrial fibrillation as noted on EMS records and in the ED. Has so far not been on OAC with limited duration and low stroke risk. Most recently, his arrhythmias have been managed with Diltiazem  120mg  daily.  Patient presented to the ED today with EMS. Today patient experienced dizziness and near syncope with diaphoresis. Per family, patient became cool, clammy, and sweaty with eyes appearing to roll back in his head. Patient was found to have low BP by family and EMS was activated. With EMS patient noted to have afib, possible SVT, sinus bradycardia, recurrent afib.   In the ED, patient in afib with ventricular rates in the low 100s. He is overall feeling better, though still has sensation of palpitations and awareness of irregular heart beat.  Labs       PLT  164 (08/28 1010) HGB  12.2* (08/28 1010) WBC 4.6 (08/28 1010)  .    Past Medical History:  Diagnosis Date   Anemia    Benign localized prostatic hyperplasia with lower urinary tract symptoms (LUTS)    History of alcohol abuse    History of chest pain    ED visit 01-17-2016 dx unspecified chest pain, GERD, hyponatremia   History of  supraventricular tachycardia    ED visit 12-15-2015;  per d/c note by dr kelsie (cardiology) to follow up as outpt for ETT and Echo   History of syncope    ED visit 01-27-2017 w/ LOC-- situational (bowel movement) and vasovagal   Schizophrenia, chronic condition [facility])      Surgical History:  Past Surgical History:  Procedure Laterality Date   TRANSURETHRAL RESECTION OF PROSTATE N/A 03/03/2019   Procedure: TRANSURETHRAL RESECTION OF THE PROSTATE (TURP);  Surgeon: Sherrilee Belvie CROME, MD;  Location: [facility];  Service: Urology;  Laterality: N/A;     (Not in a hospital admission)   Inpatient Medications:   Allergies: No Known Allergies  Family History  Problem Relation Age of Onset   Heart disease Mother        Pacemaker     Physical Exam: Vitals:   02/25/24 0926 02/25/24 0927 02/25/24 0928 02/25/24 1009  BP:  118/86    Pulse:  73    Resp:  (!) 21    Temp:  97.7 F (36.5 C)    TempSrc:  Oral    SpO2: 100% 100%  100%  Weight:   74.8 kg   Height:   5' 7 (1.702 m)     GEN- NAD, A&O x 3, normal affect HEENT: Normocephalic, atraumatic Lungs- CTAB, Normal effort.  Heart- Irregularly irregular rhythm with tachycardia. No M/G/R.  GI- Soft, NT, ND.  Extremities- No clubbing, cyanosis, or edema   Radiology/Studies: No results found.  EKG:ED ECG with  coarse afib, ventricular rates low 100s.  (personally reviewed)  TELEMETRY: atrial fibrillation with variable ventricular rates 90s-120. (personally reviewed)      DEVICE HISTORY: n/a  Assessment/Plan:  Paroxysmal atrial fibrillation Post conversion sinus bradycardia SVT In the ED, patient back in afib with ventricular rates 90s-low 100s. EMS runsheet shows tachycardia, SVT vs afib (slower than patient's historical SVT rates) as well as coarse afib and sinus bradycardia. Overall feeling improved from earlier today though still has awareness of palpitations and irregular rhythm. Labs in the ED  unremarkable. Discussed rate and rhythm management strategies with patient. Increase Dilitiazem to 180mg  daily. Continue PRN Diltiazem  30mg  for breakthrough tachycardia.  Start Flecaidine 50mg  BID. Billy Boyd concordantly start Eliquis  5mg  BID (in sinus rhythm today as seen on EMS strips above). Consider ETT once fully loaded on Flecainide . May need to consider Eliquis  alternative if cost-prohibitive after first 30 days free (insurance estimate cost appears high).  Afib clinic follow up in 2 weeks.    For questions or updates, please contact [facility] Please consult [email or web address] for contact info under     Signed, Artist Pouch, PA-C  02/25/2024, 12:48 PM       I have seen and examined this patient with Artist Pouch.  Agree with above, note added to reflect my findings.  Patient called EMS earlier today due to palpitations.  Upon EMS arrival, he was found to have a rapid heart rate.  He converted to sinus rhythm without intervention and then went into atrial fibrillation.  Currently, he has mild palpitations.  He felt well yesterday and was without complaint and unaware of palpitations.  He is in atrial fibrillation in the emergency room with heart rates in the low 100s.  GEN: No acute distress.   Neck: No JVD Cardiac: Irregular Respiratory: Normal work of breathing. GI: Soft, nontender, non-distended  MS: No edema; No deformity. Neuro:  Nonfocal  Skin: warm and dry Psych: Normal affect    SVT Paroxysmal atrial fibrillation Patient presented with likely SVT.  He then converted to sinus rhythm and he went into atrial fibrillation.  He has a history of both of these arrhythmias.  Billy Boyd increase diltiazem  to 180 mg daily and start flecainide  50 mg twice daily.  Billy Boyd also start Eliquis  5 mg twice daily.  Billy Boyd arrange for follow-up in atrial fibrillation clinic, and if he is still in atrial fibrillation we Billy Boyd plan for cardioversion.  Billy Boyd M. Kashon Kraynak MD 02/25/2024 2:37 PM

## 2024-02-25 NOTE — Discharge Instructions (Addendum)
 As we discussed, you have been seen by cardiology in the emergency department and they have made recommendations for medication changes and additions. These have been sent to the [facility] pharmacy. Please pick this up at the hospital pharmacy located on the 2nd floor near the Central elevators prior to going home today.  Your follow up appointment with the cardiologist has been scheduled and is September 11th as stated in your discharge paperwork.

## 2024-02-25 NOTE — ED Provider Triage Note (Signed)
 Emergency Medicine Provider Triage Evaluation Note  Billy Boyd , a 68 y.o. male  was evaluated in triage.  Pt complains of episodes of dizziness.  Patient recently discharged in the emergency department today with new medications.  Patient had cardiology consult completed at this time who made medication recommendations.  Patient states that since leaving the hospital he had another episode of dizziness, denies syncope, chest pain, shortness of breath, abdominal pain, nausea, vomiting.  In triage patient denies dizziness stating that right now he feels good.  Review of Systems  Positive: Dizzy episodes Negative: Chest pain, shortness of breath, syncope, headache, visual disturbances  Physical Exam  BP 108/74   Pulse (!) 52   Temp 97.6 F (36.4 C)   Resp 16   Ht 5' 7 (1.702 m)   Wt 74.8 kg   SpO2 100%   BMI 25.83 kg/m  Gen:   Awake, no distress   Resp:  Normal effort  MSK:   Moves extremities without difficulty  Other:  No obvious abnormality with auscultation of heart or lungs  Medical Decision Making  Medically screening exam initiated at 6:18 PM.  Appropriate orders placed.  Absalom Aro was informed that the remainder of the evaluation will be completed by another provider, this initial triage assessment does not replace that evaluation, and the importance of remaining in the ED until their evaluation is complete.  Orders: CBC, CMP, UA, EKG   [name], [place] 02/25/24 1820

## 2024-02-25 NOTE — ED Notes (Signed)
 NT assisted pt with getting cleaned up from an accident using the urinal. NT changed pt bed and gown as well as gave pt a pair of paper scrubs to wear home when discharged. Also gave pt a sandwich and water.

## 2024-02-25 NOTE — ED Notes (Signed)
 Redraw light green and blue top sent down

## 2024-02-25 NOTE — ED Provider Notes (Signed)
 [place] EMERGENCY DEPARTMENT AT [facility] Provider Note   CSN: [id number] Arrival date & time: 02/25/24  9083     Patient presents with: Atrial Fibrillation   Billy Boyd is a 68 y.o. male.   Patient to ED via EMS called by sister, with who he lives, for near syncopal episode today. Patient with history of memory loss/dementia unable to reliably contribute to history. Per EMS, sister states he became dizzy and nearly passed out. No fall or injury as the patient went to the floor gently. Sister reported his blood pressure was low that time and EMS reports BP 98/62 and he was in A-fib. EMS reports he was in and out of a-fib converting to SVT intermittently, as well as one episode bradycardia. EMS reports history of same.   The history is provided by the patient. No language interpreter was used.  Atrial Fibrillation       Prior to Admission medications   Medication Sig Start Date End Date Taking? Authorizing Provider  apixaban  (ELIQUIS ) 5 MG TABS tablet Take 1 tablet (5 mg total) by mouth 2 (two) times daily. 02/25/24  Yes Williams, Evan, PA-C  flecainide  (TAMBOCOR ) 50 MG tablet Take 1 tablet (50 mg total) by mouth 2 (two) times daily. 02/25/24  Yes Trudy Birmingham, PA-C  atorvastatin  (LIPITOR) 10 MG tablet Take 10 mg by mouth daily. 06/05/20   [provider]  Cyanocobalamin  (VITAMIN B-12 PO) Take 1 tablet by mouth daily.    [provider]  diltiazem  (CARDIZEM  CD) 180 MG 24 hr capsule Take 1 capsule (180 mg total) by mouth daily. 02/25/24   Trudy Birmingham, PA-C  diltiazem  (CARDIZEM ) 30 MG tablet Take 1 tablet (30 mg total) by mouth once as needed for up to 1 dose (rapid heart rate). 01/07/24   Ursuy, Renee Lynn, PA-C  ferrous sulfate 325 (65 FE) MG EC tablet Take 325 mg by mouth daily with breakfast.    [provider]  Multiple Vitamin (MULTIVITAMIN ADULT) TABS Take 1 tablet by mouth daily.    [provider]  Omega-3 Fatty Acids (FISH  OIL PO) Take 1 capsule by mouth daily.    [provider]  risperiDONE  (RISPERDAL ) 2 MG tablet Take 2 mg by mouth 2 (two) times daily.    [provider]    Allergies: Patient has no known allergies.    Review of Systems  Updated Vital Signs BP 97/62   Pulse 95   Temp 97.7 F (36.5 C) (Oral)   Resp 18   Ht 5' 7 (1.702 m)   Wt 74.8 kg   SpO2 100%   BMI 25.84 kg/m   Physical Exam Constitutional:      Appearance: He is well-developed.  HENT:     Head: Normocephalic.  Cardiovascular:     Rate and Rhythm: Tachycardia present. Rhythm irregular.     Heart sounds: No murmur heard. Pulmonary:     Effort: Pulmonary effort is normal.     Breath sounds: Normal breath sounds. No wheezing, rhonchi or rales.  Abdominal:     General: Bowel sounds are normal.     Palpations: Abdomen is soft.     Tenderness: There is no abdominal tenderness. There is no guarding or rebound.  Musculoskeletal:        General: Normal range of motion.     Cervical back: Normal range of motion and neck supple.     Right lower leg: No edema.     Left lower leg:  No edema.  Skin:    General: Skin is warm and dry.  Neurological:     General: No focal deficit present.     Mental Status: He is alert and oriented to person, place, and time.     (all labs ordered are listed, but only abnormal results are displayed) Labs Reviewed  CBC WITH DIFFERENTIAL/PLATELET - Abnormal; Notable for the following components:      Result Value   RBC 3.60 (*)    Hemoglobin 12.2 (*)    HCT 36.0 (*)    All other components within normal limits  URINALYSIS, ROUTINE W REFLEX MICROSCOPIC - Abnormal; Notable for the following components:   Color, Urine STRAW (*)    All other components within normal limits  COMPREHENSIVE METABOLIC PANEL WITH GFR - Abnormal; Notable for the following components:   Total Protein 6.4 (*)    Total Bilirubin 1.3 (*)    All other components within normal limits  PROTIME-INR -  Abnormal; Notable for the following components:   Prothrombin Time 15.3 (*)    All other components within normal limits    EKG: EKG Interpretation Date/Time:  Thursday February 25 2024 09:25:37 EDT Ventricular Rate:  106 PR Interval:    QRS Duration:  82 QT Interval:  337 QTC Calculation: 448 R Axis:   20  Text Interpretation: atrial flutter variable conduction Borderline T abnormalities, inferior leads baseline artifact Confirmed by Yolande Charleston [id number]) on 02/25/2024 9:29:40 AM  Radiology: No results found.   Procedures   Medications Ordered in the ED - No data to display  Clinical Course as of 02/25/24 1501  Thu Feb 25, 2024  1016 Attempted to contact caregiver sister, Billy Boyd. Not available, no option to leave message. [SU]  1056 Two sisters are now at bedside - not Billy Boyd who is main caregiver - however, they would like to  confirm that his memory loss or dementia is not the result of any history of alcohol abuse. They report he has never abused alcohol and the diagnosis as to the cause of memory loss is unknown, being followed by neurology.  [SU]  1128 CHA2DS2-VASc score remains low at 1, as previously estimated by cardiology.  [SU]    Clinical Course User Index [SU] Odell Balls, PA-C                                 Medical Decision Making This patient presents to the ED for concern of near syncope, this involves an extensive number of treatment options, and is a complaint that carries with it a high risk of complications and morbidity.  The differential diagnosis includes arrhythmia, CVA/TIA, dehydration, vasovagal orthostasis, sepsis/infection, PE   Co morbidities that complicate the patient evaluation  H/O PAF/SVT, schizophrenia, dementia, HLD   Additional history obtained:  Additional history and/or information obtained from chart review, notable for  Cardiology notation of office visit 12/2023: He saw Dr. Kelsie 09/19/21, doing well, no reports of  symptoms/complaints.  Reports hx of a short RP SVT, pt had not wanted to pursue ablation, as well as asymptomatic sinus bradycardia.   Transitioned to Dr. Inocencio, saw him 08/11/22, had an ER visit with his SVT treated w/adenosine /vagal maneuver (perhaps triggered by the passing of his sister the day prior) went home with PRN metoprolol . Dr. Inocencio noted an EKG from the ER with AFib  Given low risk score and low burden, not started on OAC.  Pt  continued to want to avoid procedures.   ER visit 04/11/23, dizziness, near syncope, ? If syncope, negative orthostatics, HRs 40's-60s observed, neg Trop, d-dimer, hydrated, briefly admitted, discharged/same day   05/26/23 reported waking with tachycardia, noting elevated HRs when checking his morning vitals. Treated with adenosine  w/ reports of brief interruption of SVT >  dilt gtt with resolution of his SVT Discharged Sister reported that he had not used the PRN metoprolol    I saw him 06/02/23 He is accompanied by his younger sister that he has lived with now for 12 years The 2 ER episodes wioth SVT the only this year When she checks his vitsl, mostly HR 50's-60's, has dipped to the 40's but when he walks around and she checks again is back up. He has not had any recurrent dizzy or lightheaded spells No CP, SOB, DOE They both mention that when he has an exacerbation of hearing voices, is upsetting and has been a trigger for his SVT. He is active, and otherwise feeling well They see his psychologist soon In d/w them, they both were most comfortable with ongoing watchful waiting   ER 08/14/23 : syncope, hypotensive, SVT 190's > IV dilt > SR + Flu A Seen by cards > PRN dilt dose increased   ER 09/11/23 > ER w/SVT > took his PRN dilt > did eventually stop 09/18/23 > ER > increased burden of SVT, in SVT > adenosine  brief slowing > dilt > SR 09/19/23 > ER w/SVT in d/w EP on call > dilt 120mg  daily   Saw Dr. Inocencio 10/07/23, on daily dilt > no recurrent  SVTs, discussed ablation, pt/sister preferred continuing dilt   Today's visit is scheduled as a 10mo visit ROS:    He is accompanied by his sister He is doing really well Reports one short lived episode of fast rate since his last visit He is active, busy His sister reports on the move every day, eats well/healthy He denies any kind of CP, SOB, DOE No near syncope or syncope     Arrhythmia/AAD hx No AAD to date    Lab Tests:  I Ordered, and personally interpreted labs.  The pertinent results include:       Imaging Studies ordered:  I ordered imaging studies including n/a I independently visualized and interpreted imaging which showed n/a I agree with the radiologist interpretation   Cardiac Monitoring:  The patient was maintained on a cardiac monitor.  I personally viewed and interpreted the cardiac monitored which showed an underlying rhythm of:  EKG Interpretation Date/Time:  Thursday February 25 2024 09:25:37 EDT Ventricular Rate:  106 PR Interval:    QRS Duration:  82 QT Interval:  337 QTC Calculation: 448 R Axis:   20  Text Interpretation: atrial flutter variable conduction Borderline T abnormalities, inferior leads baseline artifact Confirmed by Yolande Charleston [phone]) on 02/25/2024 9:29:40 AM    Test Considered:  N/a   Critical Interventions:  N/a   Consultations Obtained:  I requested consultation with the cardiology,  and discussed lab and imaging findings as well as pertinent plan - they recommend: they will see in the ED and make recommendation   Problem List / ED Course:  Here with near syncope and, per EMS, intermittent A-fib w/RVR and SVT, also noted episode bradycardia A-fib in the ED, normal rate. Hemodynamically stable. Labs pending Discussed with cardiology CardMaster, Trish, who advises they will provide bedside consult and recommend treatment options.  Patient seen by Dr. Shelton of cardiology who  has made medication  recommendations for start of Flecainide , increase to Diltiazem  and start Eliquis . These medications were called into the [facility] pharmacy and outpatient follow up in the atrial fibrillation clinic is arranged.    Reevaluation:  After the interventions noted above, I reevaluated the patient and found that they have :stayed the same   Social Determinants of Health:  Never a smoker; NO history of alcohol abuse per sisters.    Disposition:  After consideration of the diagnostic results and the patients response to treatment, I feel that the patient would benefit from discharge home. .   Amount and/or Complexity of Data Reviewed Labs: ordered.        Final diagnoses:  Atrial fibrillation with RVR [facility])    ED Discharge Orders          Ordered    diltiazem  (CARDIZEM  CD) 180 MG 24 hr capsule  Daily        02/25/24 1425    apixaban  (ELIQUIS ) 5 MG TABS tablet  2 times daily        02/25/24 1425    flecainide  (TAMBOCOR ) 50 MG tablet  2 times daily        02/25/24 1425               Odell Balls, PA-C 02/25/24 1501    Yolande Lamar BROCKS, MD 03/01/24 [phone]

## 2024-02-26 ENCOUNTER — Encounter (HOSPITAL_COMMUNITY): Payer: Self-pay | Admitting: Internal Medicine

## 2024-02-26 ENCOUNTER — Telehealth (HOSPITAL_COMMUNITY): Payer: Self-pay

## 2024-02-26 ENCOUNTER — Other Ambulatory Visit (HOSPITAL_COMMUNITY): Payer: Self-pay

## 2024-02-26 DIAGNOSIS — R55 Syncope and collapse: Secondary | ICD-10-CM | POA: Diagnosis not present

## 2024-02-26 DIAGNOSIS — I480 Paroxysmal atrial fibrillation: Secondary | ICD-10-CM | POA: Diagnosis not present

## 2024-02-26 DIAGNOSIS — I471 Supraventricular tachycardia, unspecified: Secondary | ICD-10-CM

## 2024-02-26 DIAGNOSIS — I4719 Other supraventricular tachycardia: Secondary | ICD-10-CM | POA: Diagnosis not present

## 2024-02-26 LAB — BASIC METABOLIC PANEL WITH GFR
Anion gap: 8 (ref 5–15)
BUN: 11 mg/dL (ref 8–23)
CO2: 26 mmol/L (ref 22–32)
Calcium: 8.7 mg/dL — ABNORMAL LOW (ref 8.9–10.3)
Chloride: 109 mmol/L (ref 98–111)
Creatinine, Ser: 1.2 mg/dL (ref 0.61–1.24)
GFR, Estimated: >60 mL/min (ref >60)
Glucose, Bld: 81 mg/dL (ref 70–99)
Potassium: 3.9 mmol/L (ref 3.5–5.1)
Sodium: 143 mmol/L (ref 135–145)

## 2024-02-26 LAB — TSH: TSH: 4.238 u[IU]/mL (ref 0.350–4.500)

## 2024-02-26 LAB — MAGNESIUM: Magnesium: 2.2 mg/dL (ref 1.7–2.4)

## 2024-02-26 LAB — HIV ANTIBODY (ROUTINE TESTING W REFLEX): HIV Screen 4th Generation wRfx: NONREACTIVE

## 2024-02-26 LAB — PHOSPHORUS: Phosphorus: 4.1 mg/dL (ref 2.5–4.6)

## 2024-02-26 MED ORDER — SODIUM CHLORIDE 0.9 % IV BOLUS
1000.0000 mL | Freq: Once | INTRAVENOUS | Status: AC
  Administered 2024-02-26: 1000 mL via INTRAVENOUS

## 2024-02-26 MED ORDER — FLECAINIDE ACETATE 50 MG PO TABS
50.0000 mg | ORAL_TABLET | Freq: Two times a day (BID) | ORAL | Status: DC
  Administered 2024-02-26 – 2024-02-27 (×3): 50 mg via ORAL
  Filled 2024-02-26 (×3): qty 1

## 2024-02-26 MED ORDER — ATORVASTATIN CALCIUM 10 MG PO TABS
10.0000 mg | ORAL_TABLET | Freq: Every day | ORAL | Status: DC
  Administered 2024-02-26 – 2024-02-27 (×2): 10 mg via ORAL
  Filled 2024-02-26 (×2): qty 1

## 2024-02-26 MED ORDER — APIXABAN 5 MG PO TABS
5.0000 mg | ORAL_TABLET | Freq: Two times a day (BID) | ORAL | Status: DC
  Administered 2024-02-26 – 2024-02-27 (×3): 5 mg via ORAL
  Filled 2024-02-26 (×3): qty 1

## 2024-02-26 MED ORDER — DILTIAZEM HCL ER COATED BEADS 180 MG PO CP24
180.0000 mg | ORAL_CAPSULE | Freq: Every day | ORAL | Status: DC
  Filled 2024-02-26: qty 1

## 2024-02-26 MED ORDER — RISPERIDONE 2 MG PO TABS
2.0000 mg | ORAL_TABLET | Freq: Two times a day (BID) | ORAL | Status: DC
  Administered 2024-02-26 – 2024-02-27 (×3): 2 mg via ORAL
  Filled 2024-02-26 (×4): qty 1

## 2024-02-26 MED ORDER — SODIUM CHLORIDE 0.9 % IV SOLN: INTRAVENOUS | Status: DC

## 2024-02-26 MED ORDER — DILTIAZEM HCL ER COATED BEADS 120 MG PO CP24
120.0000 mg | ORAL_CAPSULE | Freq: Every day | ORAL | Status: DC
  Administered 2024-02-26 – 2024-02-27 (×2): 120 mg via ORAL
  Filled 2024-02-26 (×2): qty 1

## 2024-02-26 MED ORDER — APIXABAN 5 MG PO TABS
5.0000 mg | ORAL_TABLET | Freq: Once | ORAL | Status: AC
  Administered 2024-02-26: 5 mg via ORAL
  Filled 2024-02-26: qty 1

## 2024-02-26 MED ORDER — SODIUM CHLORIDE 0.9% FLUSH
3.0000 mL | Freq: Two times a day (BID) | INTRAVENOUS | Status: DC
  Administered 2024-02-26 (×2): 3 mL via INTRAVENOUS

## 2024-02-26 MED ORDER — LACTATED RINGERS IV BOLUS
1000.0000 mL | Freq: Once | INTRAVENOUS | Status: AC
  Administered 2024-02-26: 1000 mL via INTRAVENOUS

## 2024-02-26 NOTE — Consult Note (Signed)
 ELECTROPHYSIOLOGY CONSULT NOTE    Patient ID: Billy Boyd MRN: [id number], DOB/AGE: 10-06-1955 68 y.o.  Admit date: 02/25/2024 Date of Consult: 02/26/2024  Primary Physician: Ransom Other, MD Primary Cardiologist: None  Electrophysiologist: Dr. Inocencio   Referring Provider: Dr. Odell Castor  Patient Profile: Billy Boyd is a 68 y.o. male with a history of syncope (described a vasovagal), schizophrenia, ETOH abuse, SVT, paroxysmal atrial fibrillation who is being seen today for the evaluation of AF and near syncope in the setting of atrial fibrillation at the request of Dr. Dr. Odell Castor  HPI:   Billy Boyd is a 68 y.o. male with above noted medical history. He has had multiple ER visits in the past with SVT, responsive to adenosine  and diltiazem . Has recent history of paroxysmal atrial fibrillation as noted on EMS records and in the ED. Has so far not been on OAC with limited duration and low stroke risk. Most recently, his arrhythmias have been managed with Diltiazem  120mg  daily.   Patient presented to the ED 8/28 via EMS. Patient experienced dizziness and near syncope with diaphoresis. Per family, patient became cool, clammy, and sweaty with eyes appearing to roll back in his head. Patient was found to have low BP by family and EMS was activated. With EMS patient noted to have afib, possible SVT, sinus bradycardia, recurrent afib.   Seen by EP and started on flecainide . He is not sure if he retrieved medication from [facility] prior to discharge.   He presented back to ED with NEAR syncope, denies full LOC. Noted to have paroxysmal SVT into the 180s and with aberrancy and concerns for possible VT started on amiodarone .   This am he is feeling great. Back at it he states. Lives at home with his sister.  Asymptomatic walking around the room. + orthostatics this am with mild dizziness. Has h/o syncope felt to be vasovagal.   Labs Potassium3.9 (08/29 0510) Magnesium  2.2 (08/29  0510) Creatinine, ser  1.20 (08/29 0510) PLT  169 (08/28 1818) HGB  13.0 (08/28 1818) WBC 4.0 (08/28 1818)  .    Past Medical History:  Diagnosis Date   Anemia    Benign localized prostatic hyperplasia with lower urinary tract symptoms (LUTS)    History of alcohol abuse    History of chest pain    ED visit 01-17-2016 dx unspecified chest pain, GERD, hyponatremia   History of supraventricular tachycardia    ED visit 12-15-2015;  per d/c note by dr kelsie (cardiology) to follow up as outpt for ETT and Echo   History of syncope    ED visit 01-27-2017 w/ LOC-- situational (bowel movement) and vasovagal   Schizophrenia, chronic condition [facility])      Surgical History:  Past Surgical History:  Procedure Laterality Date   TRANSURETHRAL RESECTION OF PROSTATE N/A 03/03/2019   Procedure: TRANSURETHRAL RESECTION OF THE PROSTATE (TURP);  Surgeon: Sherrilee Belvie CROME, MD;  Location[facility];  Service: Urology;  Laterality: N/A;     Medications Prior to Admission  Medication Sig Dispense Refill Last Dose/Taking   apixaban  (ELIQUIS ) 5 MG TABS tablet Take 1 tablet (5 mg total) by mouth 2 (two) times daily. 60 tablet 1 02/26/2024 at 12:12 AM   atorvastatin  (LIPITOR) 10 MG tablet Take 10 mg by mouth daily.   02/25/2024 Morning   Cyanocobalamin  (VITAMIN B-12 PO) Take 1 tablet by mouth daily.   Past Week   diltiazem  (CARDIZEM ) 30 MG tablet Take 1 tablet (30 mg total)  by mouth once as needed for up to 1 dose (rapid heart rate). 90 tablet 0 Unknown   ferrous sulfate 325 (65 FE) MG EC tablet Take 325 mg by mouth daily with breakfast.   02/25/2024 Morning   Multiple Vitamin (MULTIVITAMIN ADULT) TABS Take 1 tablet by mouth daily.   Unknown   Omega-3 Fatty Acids (FISH OIL PO) Take 1 capsule by mouth daily.   Unknown   risperiDONE  (RISPERDAL ) 2 MG tablet Take 2 mg by mouth 2 (two) times daily.   02/25/2024 Evening   diltiazem  (CARDIZEM  CD) 180 MG 24 hr capsule Take 1 capsule (180 mg total) by  mouth daily. (Patient not taking: Reported on 02/26/2024) 30 capsule 1 Not Taking   flecainide  (TAMBOCOR ) 50 MG tablet Take 1 tablet (50 mg total) by mouth 2 (two) times daily. (Patient not taking: Reported on 02/26/2024) 60 tablet 1 Not Taking    Inpatient Medications:   apixaban   5 mg Oral BID   atorvastatin   10 mg Oral Daily   diltiazem   180 mg Oral Daily   risperiDONE   2 mg Oral BID   sodium chloride  flush  3 mL Intravenous Q12H    Allergies: No Known Allergies  Family History  Problem Relation Age of Onset   Heart disease Mother        Pacemaker     Physical Exam: Vitals:   02/26/24 0545 02/26/24 0600 02/26/24 0628 02/26/24 0800  BP: 105/67 (!) 99/51 125/71 114/65  Pulse: 89 (!) 56 (!) 47 (!) 58  Resp: 15 16 15 18   Temp:   98.2 F (36.8 C) 98.4 F (36.9 C)  TempSrc:   Oral Oral  SpO2: 100% 100% 100% 100%  Weight:   73.8 kg   Height:   5' 7 (1.702 m)     GEN- NAD, A&O x 3, normal affect HEENT: Normocephalic, atraumatic Lungs- CTAB, Normal effort.  Heart- Regular rate and rhythm, No M/G/R.  GI- Soft, NT, ND.  Extremities- No clubbing, cyanosis, or edema   Radiology/Studies: No results found.  EKG: on arrival showed AFL with RVR at 106 bpm   (personally reviewed)  TELEMETRY: NSR 60-70s this am with bursts of SVT/AT overnight (personally reviewed)   Assessment/Plan:  SVT/AT Paroxysmal Atrial fibrillation Paroxysmal Atrial flutter Episodes from overnight appear bursts of AT/SVT with intermittent aberrancy. Continue diltiazem  180 mg daily Resume flecainide  50 mg BID Continue eliquis  5 mg BID  Would observe through the morning with dose of flecainide , and then can go home this afternoon if remains stable.   H/o Vasovagal syncope Orthostatic hypotension Per primary Would not expect diltiazem  or flecainide  to contribute significantly.   ETOH abuse Abstinence encouraged.  For questions or updates, please contact [facility] Please consult  [email or web address] for contact info under     Signed, Ozell Prentice Passey, PA-C  02/26/2024, 9:31 AM

## 2024-02-26 NOTE — TOC CM/SW Note (Signed)
 Transition of Care [facility]) - Inpatient Brief Assessment   Patient Details  Name: Billy Boyd MRN: [id number] Date of Birth: 1956/03/30  Transition of Care [facility]) CM/SW Contact:    Sudie Erminio Deems, RN Phone Number: 02/26/2024, 1:58 PM   Clinical Narrative: Patient presented for syncopal episode. Sister at the bedside during the visit. PTA patient states he was independent from home with his sister and her husband. Patient states he does not use any DME in the home. Patient states he has a PCP and his sisters take him to all appointments. Patient has no issues with medication cost. No home needs identified at this time. Case Manager will continue to follow for additional needs as he progresses.     Transition of Care Asessment: Insurance and Status: Insurance coverage has been reviewed Patient has primary care physician: Yes Home environment has been reviewed: reviewed Prior level of function:: independent Prior/Current Home Services: No current home services Social Drivers of Health Review: SDOH reviewed no interventions necessary Readmission risk has been reviewed: Yes Transition of care needs: no transition of care needs at this time

## 2024-02-26 NOTE — Consult Note (Signed)
 Cardiology Consultation   Patient ID: Billy Boyd MRN: [id number]; DOB: 11-26-1955  Admit date: 02/25/2024 Date of Consult: 02/26/2024  PCP:  Ransom Other, MD   [facility] Providers Cardiologist:  None  Electrophysiologist:  Will Gladis Norton, MD       Patient Profile: Billy Boyd is a 68 y.o. male with a hx of  68 y.o. male with a history of syncope (described a vasovagal),  schizophrenia, ETOH abuse, SVT, paroxysmal atrial fibrillation who is being seen today for the evaluation of tachycardia and bradycardia in the setting of SVT at request of ED.   History of Present Illness: Billy Boyd has a history of paroxysmal SVT responsive to adenosine  and diltiazem  and, paroxysmal A-fib not on anticoagulation managed with diltiazem  who presented to the ED early this morning with dizziness, near syncope with diaphoresis and hypotension.  EMS found the patient to be in A-fib with possible SVT with postconversion sinus bradycardia.  He was evaluated by our team/EP with plan to increase diltiazem  to 180 mg daily with 30 mg as needed breakthrough.  Start flecainide  50 mg twice daily as well as Eliquis .  He was unfortunately only home about an hour and before able to pick up medicines had recurrent palpitations, lightheadedness and syncope for which he re presented to the emergency department.  In the ED he was afebrile and hemodynamically stable with heart rates from the 50s to paroxysmal SVT to the 180s.  Repeat labs were unremarkable.  EKG with sinus bradycardia with paroxysmal SVT with rate dependent right bundle branch block In addition to coarse A-fib versus a flutter with variable block.  He was started on amiodarone  due to possible concern for VT with improvement in SVT.  On my evaluation he is feeling well with no acute complaints.  Past Medical History:  Diagnosis Date   Anemia    Benign localized prostatic hyperplasia with lower urinary tract symptoms (LUTS)    History  of alcohol abuse    History of chest pain    ED visit 01-17-2016 dx unspecified chest pain, GERD, hyponatremia   History of supraventricular tachycardia    ED visit 12-15-2015;  per d/c note by dr kelsie (cardiology) to follow up as outpt for ETT and Echo   History of syncope    ED visit 01-27-2017 w/ LOC-- situational (bowel movement) and vasovagal   Schizophrenia, chronic condition (HCC)    Past Surgical History:  Procedure Laterality Date   TRANSURETHRAL RESECTION OF PROSTATE N/A 03/03/2019   Procedure: TRANSURETHRAL RESECTION OF THE PROSTATE (TURP);  Surgeon: Sherrilee Belvie CROME, MD;  Location: [facility];  Service: Urology;  Laterality: N/A;     Home Medications:  Prior to Admission medications   Medication Sig Start Date End Date Taking? Authorizing Provider  apixaban  (ELIQUIS ) 5 MG TABS tablet Take 1 tablet (5 mg total) by mouth 2 (two) times daily. 02/25/24  Yes Trudy Birmingham, PA-C  atorvastatin  (LIPITOR) 10 MG tablet Take 10 mg by mouth daily. 06/05/20  Yes [provider]  Cyanocobalamin  (VITAMIN B-12 PO) Take 1 tablet by mouth daily.   Yes [provider]  diltiazem  (CARDIZEM ) 30 MG tablet Take 1 tablet (30 mg total) by mouth once as needed for up to 1 dose (rapid heart rate). 01/07/24  Yes Leverne Charlies Helling, PA-C  ferrous sulfate 325 (65 FE) MG EC tablet Take 325 mg by mouth daily with breakfast.   Yes [provider]  Multiple Vitamin (MULTIVITAMIN ADULT) TABS Take 1  tablet by mouth daily.   Yes [provider]  Omega-3 Fatty Acids (FISH OIL PO) Take 1 capsule by mouth daily.   Yes [provider]  risperiDONE  (RISPERDAL ) 2 MG tablet Take 2 mg by mouth 2 (two) times daily.   Yes [provider]  diltiazem  (CARDIZEM  CD) 180 MG 24 hr capsule Take 1 capsule (180 mg total) by mouth daily. Patient not taking: Reported on 02/26/2024 02/25/24   Trudy Birmingham, PA-C  flecainide  (TAMBOCOR ) 50 MG tablet Take 1 tablet (50  mg total) by mouth 2 (two) times daily. Patient not taking: Reported on 02/26/2024 02/25/24   Trudy Birmingham, PA-C    Scheduled Meds:  Continuous Infusions:  amiodarone  60 mg/hr (02/25/24 2356)   Followed by   amiodarone      PRN Meds:   Allergies:   No Known Allergies  Social History:   Social History   Socioeconomic History   Marital status: Married    Spouse name: Not on file   Number of children: 1   Years of education: 12   Highest education level: Not on file  Occupational History   Not on file  Tobacco Use   Smoking status: Never   Smokeless tobacco: Never  Vaping Use   Vaping status: Never Used  Substance and Sexual Activity   Alcohol use: No   Drug use: No   Sexual activity: Not on file  Other Topics Concern   Not on file  Social History Narrative   Lives with sister.     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  All other ROS reviewed and negative.     Physical Exam/Data: Vitals:   02/25/24 2230 02/25/24 2330 02/25/24 2345 02/26/24 0000  BP: 95/65 (!) 93/58 112/60 93/60  Pulse: (!) 58 (!) 181 68 (!) 53  Resp:  13 16 15   Temp:      TempSrc:      SpO2: 100% 100% 100% 100%  Weight:      Height:       No intake or output data in the 24 hours ending 02/26/24 0033    02/25/2024    5:53 PM 02/25/2024    9:28 AM 01/07/2024    7:56 AM  Last 3 Weights  Weight (lbs) 164 lb 14.5 oz 165 lb 167 lb  Weight (kg) 74.8 kg 74.844 kg 75.751 kg     Body mass index is 25.83 kg/m.  General:  Well nourished, well developed, in no acute  distress HEENT: normal Neck: no JVD Vascular: No carotid bruits; Distal pulses 2+ bilaterally Cardiac:  normal S1, S2; RRR; no murmur  Lungs:  clear to auscultation bilaterally, no wheezing, rhonchi or rales  Abd: soft, nontender, no hepatomegaly  Ext: no edema Musculoskeletal:  No deformities, BUE and BLE strength normal and equal Skin: warm and dry  Neuro:  CNs 2-12 intact, no focal abnormalities noted Psych:  Normal affect   EKG:  The EKG was  personally reviewed and demonstrates: Sinus bradycardia with paroxysmal SVT with right bundle branch block.  Other EKG demonstrating coarse A-fib versus variable a flutter with RVR  Telemetry:  Telemetry was personally reviewed and demonstrates: Same  Relevant CV Studies: TTE 2024 IMPRESSIONS     1. Left ventricular ejection fraction, by estimation, is 60 to 65%. The  left ventricle has normal function. The left ventricle has no regional  wall motion abnormalities. Left ventricular diastolic parameters were  normal.   2. Right ventricular systolic function is normal. The right ventricular  size is normal.   3. The mitral valve is normal in structure. No evidence of mitral valve  regurgitation. No evidence of mitral stenosis.   4. The aortic valve is tricuspid. Aortic valve regurgitation is not  visualized. Aortic valve sclerosis is present, with no evidence of aortic  valve stenosis.   Laboratory Data: High Sensitivity Troponin:  No results for input(s): TROPONINIHS in the last 720 hours.   Chemistry Recent Labs  Lab 02/25/24 1227 02/25/24 2011  NA 142 143  K 4.2 4.2  CL 111 107  CO2 25 26  GLUCOSE 86 112*  BUN 11 11  CREATININE 1.09 1.21  CALCIUM  9.4 9.6  GFRNONAA >60 >60  ANIONGAP 6 10    Recent Labs  Lab 02/25/24 1227 02/25/24 2011  PROT 6.4* 7.2  ALBUMIN 3.6 4.0  AST 22 23  ALT 14 15  ALKPHOS 47 47  BILITOT 1.3* 1.2   Lipids No results for input(s): CHOL, TRIG, HDL, LABVLDL, LDLCALC, CHOLHDL in  the last 168 hours.  Hematology Recent Labs  Lab 02/25/24 1010 02/25/24 1818  WBC 4.6 4.0  RBC 3.60* 3.58*  HGB 12.2* 13.0  HCT 36.0* 36.4*  MCV 100.0 101.7*  MCH 33.9 36.3*  MCHC 33.9 35.7  RDW 13.2 13.4  PLT 164 169   Thyroid  No results for input(s): TSH, FREET4 in the last 168 hours.  BNPNo results for input(s): BNP, PROBNP in the last 168 hours.  DDimer No results for input(s): DDIMER in the last 168 hours.  Radiology/Studies:  No results found.   Assessment and Plan: Paroxysmal A-fib/flutter with postconversion sinus bradycardia SVT with rate dependent RBBB Unfortunately patient returned with recurrent symptomatology and paroxysms of SVT.  He was started on amiodarone  given concern for possible VT but on my review I think his wide-complex tachycardia is rate dependent bundle branch block vs. aberrancy, plan to admit to medicine.  Okay to continue amiodarone  for now but will need a washout prior to further consideration of additional 1c antiarrhythmics.  Will start Eliquis  for anticoagulation. - Continue amiodarone  overnight, we may plan to stop tomorrow - Continue telemetry - Continue dilt 180 mg daily starting tomorrow - Final antiarrythmic plan per EP - start eliquis  5 mg BID   Risk Assessment/Risk Scores:         CHA2DS2-VASc Score = 1   This indicates a 0.6% annual risk of stroke. The patient's score is based upon: CHF History: 0 HTN History: 0 Diabetes History: 0 Stroke History: 0 Vascular Disease History: 0 Age Score: 1 Gender Score: 0         For questions or updates, please contact [facility] Please consult [email or web address] for contact info under    Signed, Ozell Bushman, MD  02/26/2024 12:33 AM

## 2024-02-26 NOTE — Progress Notes (Signed)
 Orthostatic vital signs done per order and charted in flowsheets. Also received instruction to chart in progress note.   Lying: HR 66, BP 114/65 Sitting: HR 73, BP 103/64 Standing: HR 80, BP 109/66 Standing for 3 minutes: HR 72, BP 83/63

## 2024-02-26 NOTE — Care Management Obs Status (Signed)
 MEDICARE OBSERVATION STATUS NOTIFICATION   Patient Details  Name: Billy Boyd MRN: [id number] Date of Birth: March 14, 1956   Medicare Observation Status Notification Given:  Yes    Vonzell Arrie Sharps 02/26/2024, 10:40 AM

## 2024-02-26 NOTE — H&P (Signed)
 History and Physical    Billy Boyd FMW:[id number] DOB: [birth year] DOA: 02/25/2024  PCP: Ransom Other, MD   Patient coming from: Home   Chief Complaint: ? Syncope   HPI:  Billy Boyd is a 68 y.o. male with hx of syncope, SVT, paroxysmal Afib, HLD, BPH, schizophrenia, ETOH abuse, was seen in ED twice yesterday for syncope. On initial evaluation had SVT and intermittent bradycardia, EP was consulted and felt having SVT and coarse Afib, post conversion bradycardia, planned medication changes including Diltiazem  planned to increase to 180 mg daily , and 30 mg for breakthrough. And planned to start on Flecainide  50 mg BID, Eliquis  5 mg BID. He was discharged however, he had recurrent syncope at home so returned to the ED.   On my interview denies true syncope, but reports near syncopal episode which happened yesterday. Notes both occurred while he was upright and had been walking (not immediately after a transition). Began to feel dizzy described as lightheadedness. Denies fall or true syncope. Went away in about 3-4 minutes. Denies any chest pain or palpitations. Maybe some shortness of breath associated. He denies N/V, diaphoresis. No recent illness. Reports good PO intake recently.    Review of Systems:  ROS complete and negative except as marked above   No Known Allergies  Prior to Admission medications   Medication Sig Start Date End Date Taking? Authorizing Provider  apixaban  (ELIQUIS ) 5 MG TABS tablet Take 1 tablet (5 mg total) by mouth 2 (two) times daily. 02/25/24  Yes Trudy Birmingham, PA-C  atorvastatin  (LIPITOR) 10 MG tablet Take 10 mg by mouth daily. 06/05/20  Yes [provider]  Cyanocobalamin  (VITAMIN B-12 PO) Take 1 tablet by mouth daily.   Yes [provider]  diltiazem  (CARDIZEM ) 30 MG tablet Take 1 tablet (30 mg total) by mouth once as needed for up to 1 dose (rapid heart rate). 01/07/24  Yes Leverne Charlies Helling, PA-C  ferrous sulfate 325 (65 FE) MG EC  tablet Take 325 mg by mouth daily with breakfast.   Yes [provider]  Multiple Vitamin (MULTIVITAMIN ADULT) TABS Take 1 tablet by mouth daily.   Yes [provider]  Omega-3 Fatty Acids (FISH OIL PO) Take 1 capsule by mouth daily.   Yes [provider]  risperiDONE  (RISPERDAL ) 2 MG tablet Take 2 mg by mouth 2 (two) times daily.   Yes [provider]  diltiazem  (CARDIZEM  CD) 180 MG 24 hr capsule Take 1 capsule (180 mg total) by mouth daily. Patient not taking: Reported on 02/26/2024 02/25/24   Trudy Birmingham, PA-C  flecainide  (TAMBOCOR ) 50 MG tablet Take 1 tablet (50 mg total) by mouth 2 (two) times daily. Patient not taking: Reported on 02/26/2024 02/25/24   Trudy Birmingham, PA-C    Past Medical History:  Diagnosis Date   Anemia    Benign localized prostatic hyperplasia with lower urinary tract symptoms (LUTS)    History of alcohol abuse    History of chest pain    ED visit 01-17-2016 dx unspecified chest pain, GERD, hyponatremia   History of supraventricular tachycardia    ED visit 12-15-2015;  per d/c note by dr kelsie (cardiology) to follow up as outpt for ETT and Echo   History of syncope    ED visit 01-27-2017 w/ LOC-- situational (bowel movement) and vasovagal   Schizophrenia, chronic condition [facility])     Past Surgical History:  Procedure Laterality Date   TRANSURETHRAL RESECTION OF PROSTATE N/A 03/03/2019   Procedure: TRANSURETHRAL RESECTION  OF THE PROSTATE (TURP);  Surgeon: Sherrilee Belvie CROME, MD;  Location: [facility];  Service: Urology;  Laterality: N/A;     reports that he has never smoked. He has never used smokeless tobacco. He reports that he does not drink alcohol and does not use drugs.  Family History  Problem Relation Age of Onset   Heart disease Mother        Pacemaker     Physical Exam: Vitals:   02/26/24 0246 02/26/24 0315 02/26/24 0345 02/26/24 0415  BP:  91/65 104/70 (!) 86/53  Pulse:  (!) 48 (!) 53 (!)  46  Resp:  17 15 11   Temp: (!) 97.4 F (36.3 C)     TempSrc: Temporal     SpO2:  100% 100% 100%  Weight:      Height:        Gen: Awake, alert, NAD   CV: Bradycardic, with ectopy. normal S1, S2, no murmurs  Resp: Normal WOB, CTAB  Abd: Flat, normoactive, nontender MSK: Symmetric, no edema  Skin: No rashes or lesions to exposed skin  Neuro: Alert and interactive  Psych: euthymic, appropriate    Data review:   Labs reviewed, notable for:   Chemistries unremarkable Macrocytosis without significant anemia   Micro:  Results for orders placed or performed during the hospital encounter of 08/14/23  Blood culture (routine x 2)     Status: None   Collection Time: 08/14/23  3:04 PM   Specimen: BLOOD LEFT FOREARM  Result Value Ref Range Status   Specimen Description BLOOD LEFT FOREARM  Final   Special Requests   Final    BOTTLES DRAWN AEROBIC AND ANAEROBIC Blood Culture results may not be optimal due to an excessive volume of blood received in culture bottles   Culture   Final    NO GROWTH 5 DAYS Performed at [facility], [address], [place], [place] [address]    Report Status 08/19/2023 FINAL  Final  Resp panel by RT-PCR (RSV, Flu A&B, Covid) Anterior Nasal Swab     Status: Abnormal   Collection Time: 08/14/23  3:11 PM   Specimen: Anterior Nasal Swab  Result Value Ref Range Status   SARS Coronavirus 2 by RT PCR NEGATIVE NEGATIVE Final   Influenza A by PCR POSITIVE (A) NEGATIVE Final   Influenza B by PCR NEGATIVE NEGATIVE Final    Comment: (NOTE) The Xpert Xpress SARS-CoV-2/FLU/RSV plus assay is intended as an aid in the diagnosis of influenza from Nasopharyngeal swab specimens and should not be used as a sole basis for treatment. Nasal washings and aspirates are unacceptable for Xpert Xpress SARS-CoV-2/FLU/RSV testing.  Fact Sheet for Patients: BloggerCourse.com  Fact Sheet for Healthcare  Providers: SeriousBroker.it  This test is not yet approved or cleared by the [place]  FDA and has been authorized for detection and/or diagnosis of SARS-CoV-2 by FDA under an Emergency Use Authorization (EUA). This EUA will remain in effect (meaning this test can be used) for the duration of the COVID-19 declaration under Section 564(b)(1) of the Act, 21 U.S.C. section 360bbb-3(b)(1), unless the authorization is terminated or revoked.     Resp Syncytial Virus by PCR NEGATIVE NEGATIVE Final    Comment: (NOTE) Fact Sheet for Patients: BloggerCourse.com  Fact Sheet for Healthcare Providers: SeriousBroker.it  This test is not yet approved or cleared by the [place]  FDA and has been authorized for detection and/or diagnosis of SARS-CoV-2 by FDA under an Emergency Use Authorization (EUA). This  EUA will remain in effect (meaning this test can be used) for the duration of the COVID-19 declaration under Section 564(b)(1) of the Act, 21 U.S.C. section 360bbb-3(b)(1), unless the authorization is terminated or revoked.  Performed at [facility], [address], [place], [place] [address]     Imaging reviewed:  No results found.  EKG:  Personally reviewed sinus bradycardia with PAC, development of a SVT towards the end of the strip irregular  ED Course:  Cardiology consulted and has seen the patient.  He was treated with amiodarone  bolus + gtt in the ED, 1 L IVF.    Assessment/Plan:  68 y.o. male with hx syncope, SVT, paroxysmal Afib, HLD, BPH, schizophrenia, ETOH abuse, was seen in ED twice yesterday for syncope (although describes as near syncopal events). On initial evaluation had SVT and intermittent bradycardia, EP was consulted and felt having SVT and coarse Afib, post conversion bradycardia, planned medication changes including Diltiazem  to increase to 180 mg daily, and continue 30 mg for  breakthrough, And planned to start on Flecainide  50 mg BID, Eliquis  5 mg BID. He was discharged however, he had recurrent nearsyncope at home so returned to the ED.    Near Syncope, recurrent  Possible cardiogenic / arrhythmia mediated  Recurrent SVT, with intermittent bradycardia Hx paroxysmal Afib  Recurrent syncope with prodrome. EKG/telemetry per cardiology reviewed as likely SVT with rate dependent bundle branch block/aberrancy.  Continues to have intermittent bradycardia intermixed with SVT - Cardiology consults, has been seen by Dr. Vincente plan to continue on amiodarone  overnight, continue diltiazem  180 mg daily tomorrow and start on anticoagulation with Eliquis .  They will arrange for EP consultation -Orthostatics  -Check TSH, Mg / phos  -Continue telemonitoring  Borderline hypotension, suspect hypovolemic   -- s/p 1 L NS, give additional L of IVF   Chronic medical problems: Hyperlipidemia: Continue home atorvastatin  BPH: Not on medication Schizophrenia: Continue home risperidone  EtOH abuse: Noted   Body mass index is 25.83 kg/m.    DVT prophylaxis:  Eliquis  Code Status:  Full Code Diet:  Diet Orders (From admission, onward)    None      Family Communication:  None   Consults:  Cardiology   Admission status:   Observation, Telemetry bed  Severity of Illness: The appropriate patient status for this patient is OBSERVATION. Observation status is judged to be reasonable and necessary in order to provide the required intensity of service to ensure the patient's safety. The patient's presenting symptoms, physical exam findings, and initial radiographic and laboratory data in the context of their medical condition is felt to place them at decreased risk for further clinical deterioration. Furthermore, it is anticipated that the patient will be medically stable for discharge from the hospital within 2 midnights of admission.    Dorn Dawson, MD [facility]  How to contact the [facility] Attending or Consulting provider 7A - 7P or covering provider during after hours 7P -7A, for this patient.  Check the care team in [facility] and look for a) attending/consulting [facility] provider listed and b) the [facility] team listed Log into [email or web address] and use [facility]'s universal password to access. If you do not have the password, please contact the hospital operator. Locate the [facility] provider you are looking for under [facility] and page to a number that you can be directly reached. If you still have difficulty reaching the provider, please page the [facility] (Director on Call) for the Hospitalists listed on amion for assistance.  02/26/2024, 4:34 AM

## 2024-02-26 NOTE — Progress Notes (Signed)
 [facility] PROGRESS NOTE    Progress Note  Billy Boyd  FMW:[id number] DOB: 01-27-1956 DOA: 02/25/2024 PCP: Ransom Other, MD     Brief Narrative:   Billy Boyd is an 68 y.o. male past medical history of SVT paroxysmal atrial fibrillation on Eliquis  BPH schizophrenia alcohol abuse seen twice in the ED for syncope EP saw him and felt to be he was having SVT with coarse A-fib and postconversion bradycardia he was placed on diltiazem  180 mg and 30 mg for breakthrough.  Was discharged and had recurrent syncope so returns to the ED.  It is more like near syncope  Assessment/Plan:   Recurrent near Syncope possibly due to arrhythmia/history of A-fib: Twelve-lead EKG showed SVT with rate dependent right bundle branch block and intermittent bradycardia. Cardiology was consulted who started on amiodarone  overnight continue on diltiazem  and Eliquis . EP has been consulted. TSH 4.2 mag 2.2 and Phos 4.1  Borderline hypotension: Suspect hypovolemic we will start her on IV fluids, blood pressure is improved this morning.  Hyperlipidemia:  continue statins.  BPH: Currently on no medications.  Schizophrenia: Continue risperidone .  Alcohol abuse: Noted started on thiamine  and folate.  DVT prophylaxis: Eliquis  Family Communication:none Status is: Observation The patient remains OBS appropriate and will d/c before 2 midnights.    Code Status:     Code Status Orders  (From admission, onward)           Start     Ordered   02/26/24 0455  Full code  Continuous       Question:  By:  Answer:  Other   02/26/24 0456           Code Status History     Date Active Date Inactive Code Status Order ID Comments User Context   04/12/2023 0755 04/12/2023 2127 Full Code [id number]  Jurline Rockie CROME, NP ED   03/06/2019 1349 03/08/2019 2057 Full Code [id number]  Vernon Velna SAUNDERS, MD ED   03/03/2019 1513 03/04/2019 1320 Full Code [id number]  Sherrilee Belvie CROME, MD Inpatient         IV  Access:   Peripheral IV   Procedures and diagnostic studies:   No results found.   Medical Consultants:   None.   Subjective:    Billy Boyd no complaints  Objective:    Vitals:   02/26/24 0530 02/26/24 0545 02/26/24 0600 02/26/24 0628  BP: 109/72 105/67 (!) 99/51 125/71  Pulse: (!) 40 89 (!) 56 (!) 47  Resp: 15 15 16 15   Temp:    98.2 F (36.8 C)  TempSrc:    Oral  SpO2: 100% 100% 100% 100%  Weight:    73.8 kg  Height:    5' 7 (1.702 m)   SpO2: 100 %   Intake/Output Summary (Last 24 hours) at 02/26/2024 0727 Last data filed at 02/26/2024 0246 Gross per 24 hour  Intake 1000 ml  Output --  Net 1000 ml   Filed Weights   02/25/24 1753 02/26/24 0628  Weight: 74.8 kg 73.8 kg    Exam: General exam: In no acute distress. Respiratory system: Good air movement and clear to auscultation. Cardiovascular system: S1 & S2 heard, RRR. No JVD. Gastrointestinal system: Abdomen is nondistended, soft and nontender.  Extremities: No pedal edema. Skin: No rashes, lesions or ulcers Psychiatry: Judgement and insight appear normal. Mood & affect appropriate.    Data Reviewed:    Labs: Basic Metabolic Panel: Recent Labs  Lab 02/25/24 1227 02/25/24 2011 02/26/24 0510  NA 142 143 143  K 4.2 4.2 3.9  CL 111 107 109  CO2 25 26 26   GLUCOSE 86 112* 81  BUN 11 11 11   CREATININE 1.09 1.21 1.20  CALCIUM  9.4 9.6 8.7*  MG  --   --  2.2  PHOS  --   --  4.1   GFR Estimated Creatinine Clearance: 55.1 mL/min (by C-G formula based on SCr of 1.2 mg/dL). Liver Function Tests: Recent Labs  Lab 02/25/24 1227 02/25/24 2011  AST 22 23  ALT 14 15  ALKPHOS 47 47  BILITOT 1.3* 1.2  PROT 6.4* 7.2  ALBUMIN 3.6 4.0   No results for input(s): LIPASE, AMYLASE in the last 168 hours. No results for input(s): AMMONIA in the last 168 hours. Coagulation profile Recent Labs  Lab 02/25/24 1227  INR 1.2   COVID-19 Labs  No results for input(s): DDIMER, FERRITIN,  LDH, CRP in the last 72 hours.  Lab Results  Component Value Date   SARSCOV2NAA NEGATIVE 08/14/2023   SARSCOV2NAA Not Detected 07/29/2019   SARSCOV2NAA NEGATIVE 03/06/2019   SARSCOV2NAA NEGATIVE 02/28/2019    CBC: Recent Labs  Lab 02/25/24 1010 02/25/24 1818  WBC 4.6 4.0  NEUTROABS 3.4 2.7  HGB 12.2* 13.0  HCT 36.0* 36.4*  MCV 100.0 101.7*  PLT 164 169   Cardiac Enzymes: No results for input(s): CKTOTAL, CKMB, CKMBINDEX, TROPONINI in the last 168 hours. BNP (last 3 results) No results for input(s): PROBNP in the last 8760 hours. CBG: No results for input(s): GLUCAP in the last 168 hours. D-Dimer: No results for input(s): DDIMER in the last 72 hours. Hgb A1c: No results for input(s): HGBA1C in the last 72 hours. Lipid Profile: No results for input(s): CHOL, HDL, LDLCALC, TRIG, CHOLHDL, LDLDIRECT in the last 72 hours. Thyroid  function studies: Recent Labs    02/26/24 0510  TSH 4.238   Anemia work up: No results for input(s): VITAMINB12, FOLATE, FERRITIN, TIBC, IRON, RETICCTPCT in the last 72 hours. Sepsis Labs: Recent Labs  Lab 02/25/24 1010 02/25/24 1818  WBC 4.6 4.0   Microbiology No results found for this or any previous visit (from the past 240 hours).   Medications:    apixaban   5 mg Oral BID   atorvastatin   10 mg Oral Daily   diltiazem   180 mg Oral Daily   risperiDONE   2 mg Oral BID   sodium chloride  flush  3 mL Intravenous Q12H   Continuous Infusions:    LOS: 0 days   Billy Boyd  [facility]  02/26/2024, 7:27 AM

## 2024-02-26 NOTE — Discharge Instructions (Addendum)

## 2024-02-26 NOTE — ED Notes (Signed)
 [facility] notified that patient is on his way up to the floor .

## 2024-02-26 NOTE — Telephone Encounter (Signed)
 Pharmacy Patient Advocate Encounter  Insurance verification completed.    The patient is insured through [facility]. Patient has Medicare and is not eligible for a copay card, but may be able to apply for patient assistance or Medicare RX Payment Plan (Patient Must reach out to their plan, if eligible for payment plan), if available.    Ran test claim for Eliquis  and the current 30 day co-pay is $302.73.  Ran test claim for Xarelto and the current 30 day co-pay is $302.73.  This test claim was processed through [facility]- copay amounts may vary at other pharmacies due to pharmacy/plan contracts, or as the patient moves through the different stages of their insurance plan.

## 2024-02-27 ENCOUNTER — Other Ambulatory Visit (HOSPITAL_COMMUNITY): Payer: Self-pay

## 2024-02-27 DIAGNOSIS — R55 Syncope and collapse: Secondary | ICD-10-CM | POA: Diagnosis not present

## 2024-02-27 DIAGNOSIS — I498 Other specified cardiac arrhythmias: Secondary | ICD-10-CM | POA: Diagnosis not present

## 2024-02-27 DIAGNOSIS — I4719 Other supraventricular tachycardia: Secondary | ICD-10-CM | POA: Diagnosis not present

## 2024-02-27 MED ORDER — DILTIAZEM HCL ER COATED BEADS 120 MG PO CP24
120.0000 mg | ORAL_CAPSULE | Freq: Every day | ORAL | 0 refills | Status: AC
  Filled 2024-02-27: qty 30, 30d supply, fill #0

## 2024-02-27 NOTE — Progress Notes (Signed)
 Patient discharged from room via wheelchair and taken out to private auto  stopped for TOC meds but none filled here

## 2024-02-27 NOTE — Evaluation (Signed)
 Physical Therapy Evaluation and Discharge Patient Details Name: Billy Boyd MRN: [id number] DOB: [birth year] Today's Date: 02/27/2024  History of Present Illness  68 y.o. male with hx of syncope, SVT, paroxysmal Afib, HLD, BPH, schizophrenia, ETOH abuse, was seen in ED twice yesterday for syncope. On initial evaluation had SVT and intermittent bradycardia, EP was consulted and felt having SVT and coarse Afib, post conversion bradycardia, planned medication changes. He was discharged however, he had near syncope at home so returned to the ED 02/26/24. +orthostasis 8/29; +bradycardia  Clinical Impression   Patient evaluated by Physical Therapy with no further acute PT needs identified. Patient without symptoms/dizziness throughout session despite HR varying 37-77 and SBP dropping (see vitals flowsheet). BP maintained during ambulation. MD and RN made aware. PT is signing off. Thank you for this referral.         If plan is discharge home, recommend the following: Direct supervision/assist for medications management;Direct supervision/assist for financial management   Can travel by private vehicle        Equipment Recommendations None recommended by PT  Recommendations for Other Services       Functional Status Assessment Patient has not had a recent decline in their functional status     Precautions / Restrictions Precautions Precautions: Other (comment) Precaution/Restrictions Comments: +drop in SBP; asymptomatic      Mobility  Bed Mobility Overal bed mobility: Independent                  Transfers Overall transfer level: Independent Equipment used: None                    Ambulation/Gait Ambulation/Gait assistance: Independent Gait Distance (Feet): 200 Feet Assistive device: None Gait Pattern/deviations: WFL(Within Functional Limits)   Gait velocity interpretation: >2.62 ft/sec, indicative of community ambulatory      Stairs Stairs:  Yes Stairs assistance: Modified independent (Device/Increase time) Stair Management: One rail Right, Alternating pattern, Forwards Number of Stairs: 8 General stair comments: no imbalance or dizziness  Wheelchair Mobility     Tilt Bed    Modified Rankin (Stroke Patients Only)       Balance Overall balance assessment: Independent                                           Pertinent Vitals/Pain Pain Assessment Pain Assessment: No/denies pain    Home Living Family/patient expects to be discharged to:: Private residence Living Arrangements: Other (Comment) (sister) Available Help at Discharge: Family;Available PRN/intermittently Type of Home: House Home Access: Stairs to enter Entrance Stairs-Rails: None Entrance Stairs-Number of Steps: 1 Alternate Level Stairs-Number of Steps: flight Home Layout: Two level;Bed/bath upstairs Home Equipment: None      Prior Function Prior Level of Function : Independent/Modified Independent                     Extremity/Trunk Assessment   Upper Extremity Assessment Upper Extremity Assessment: Overall WFL for tasks assessed    Lower Extremity Assessment Lower Extremity Assessment: Overall WFL for tasks assessed    Cervical / Trunk Assessment Cervical / Trunk Assessment: Normal  Communication   Communication Communication: No apparent difficulties    Cognition Arousal: Alert Behavior During Therapy: WFL for tasks assessed/performed   PT - Cognitive impairments: History of cognitive impairments  Following commands: Intact       Cueing Cueing Techniques: Verbal cues     General Comments General comments (skin integrity, edema, etc.): Sister present    Exercises     Assessment/Plan    PT Assessment Patient does not need any further PT services  PT Problem List         PT Treatment Interventions      PT Goals (Current goals can be found in the Care Plan  section)  Acute Rehab PT Goals Patient Stated Goal: none PT Goal Formulation: All assessment and education complete, DC therapy    Frequency       Co-evaluation               AM-PAC PT 6 Clicks Mobility  Outcome Measure Help needed turning from your back to your side while in a flat bed without using bedrails?: None Help needed moving from lying on your back to sitting on the side of a flat bed without using bedrails?: None Help needed moving to and from a bed to a chair (including a wheelchair)?: None Help needed standing up from a chair using your arms (e.g., wheelchair or bedside chair)?: None Help needed to walk in hospital room?: None Help needed climbing 3-5 steps with a railing? : None 6 Click Score: 24    End of Session Equipment Utilized During Treatment: Gait belt Activity Tolerance: Patient tolerated treatment well Patient left: in bed;with call bell/phone within reach;with bed alarm set;with family/visitor present Nurse Communication: Mobility status;Other (comment) (BP and HR issues) PT Visit Diagnosis: Other abnormalities of gait and mobility (R26.89)    Time: 1020-1054 PT Time Calculation (min) (ACUTE ONLY): 34 min   Charges:   PT Evaluation $PT Eval Low Complexity: 1 Low PT Treatments $Gait Training: 8-22 mins PT General Charges $$ ACUTE PT VISIT: 1 Visit          Macario RAMAN, PT Acute Rehabilitation Services  Office [phone]   Macario SHAUNNA Soja 02/27/2024, 11:05 AM

## 2024-02-27 NOTE — TOC Transition Note (Signed)
 Transition of Care [facility]) - Discharge Note   Patient Details  Name: Billy Boyd MRN: [id number] Date of Birth: Jan 05, 1956  [facility] CM/SW Contact:  Robynn Eileen Hoose, RN Phone Number: 02/27/2024, 11:51 AM   Clinical Narrative:   Secure message from floor nurse regarding family needing information on resources to help them at home. Spoke with family, they wanted information on community resources for patients when they are discharged from the hospital. Family concerned that patient is home alone due to sister works. Discussed private pay for caregiver, meals on wheels, and adult day care centers. Family given Production manager from [employer]          Patient Goals and CMS Choice            Discharge Placement                       Discharge Plan and Services Additional resources added to the After Visit Summary for                                       Social Drivers of Health (SDOH) Interventions SDOH Screenings   Food Insecurity: No Food Insecurity (02/26/2024)  Housing: Low Risk  (02/26/2024)  Transportation Needs: No Transportation Needs (02/26/2024)  Utilities: Not At Risk (02/26/2024)  Social Connections: Unknown (02/26/2024)  Tobacco Use: Low Risk  (02/26/2024)     Readmission Risk Interventions     No data to display

## 2024-02-27 NOTE — Plan of Care (Signed)
  Problem: Activity: Goal: Risk for activity intolerance will decrease Outcome: Progressing   Problem: Safety: Goal: Ability to remain free from injury will improve Outcome: Progressing   

## 2024-02-27 NOTE — Progress Notes (Signed)
 Pt noted on telemetry with HR 37 bpm, non sustaining while asleep. Pt aroused easily, alert and oriented x4, denies CP, dizziness, or shortness of breath. HR increased to 50's bpm within minutes after arousal. Will continue to monitor pt.

## 2024-02-27 NOTE — Progress Notes (Signed)
   Rounding Note    Patient Name: Billy Boyd Date of Encounter: 02/27/2024  [facility] Cardiologist: None   Subjective   No acute events overnight.  Family at the bedside.  Vital Signs    Vitals:   02/26/24 2005 02/26/24 2343 02/27/24 0429 02/27/24 0840  BP: 133/74 106/71 102/66 106/68  Pulse: (!) 48 (!) 48 (!) 41   Resp: 16 18 16 16   Temp: 98.7 F (37.1 C) 98.3 F (36.8 C) 98.3 F (36.8 C) 98.2 F (36.8 C)  TempSrc: Oral Oral Oral Oral  SpO2: 100% 100% 96%   Weight:      Height:        Intake/Output Summary (Last 24 hours) at 02/27/2024 0912 Last data filed at 02/27/2024 0840 Gross per 24 hour  Intake 1856.7 ml  Output 800 ml  Net 1056.7 ml      02/26/2024    6:28 AM 02/25/2024    5:53 PM 02/25/2024    9:28 AM  Last 3 Weights  Weight (lbs) 162 lb 9.6 oz 164 lb 14.5 oz 165 lb  Weight (kg) 73.755 kg 74.8 kg 74.844 kg      Telemetry    Sinus with frequent PACs/salvos of atrial tachycardia- Personally Reviewed  ECG    Personally Reviewed  Physical Exam   GEN: No acute distress.   Cardiac: Irregular rhythm, no murmurs, rubs, or gallops.  Respiratory: Clear to auscultation bilaterally. Psych: Normal affect   Assessment & Plan    #Paroxysmal atrial tachycardia/atrial fibrillation Continue diltiazem , flecainide , Eliquis  Okay to discharge from an EP perspective   EP will sign off.  Please call with questions or concerns    Ole T. Cindie, MD, [facility] Cardiac Electrophysiology

## 2024-02-27 NOTE — Discharge Summary (Signed)
 Physician Discharge Summary  Billy Boyd FMW:[id number] DOB: [birth year] DOA: 02/25/2024  PCP: Ransom Other, MD  Admit date: 02/25/2024 Discharge date: 02/27/2024  Admitted From: Home Disposition:  Home  Recommendations for Outpatient Follow-up:  Follow up with PCP in 1-2 weeks Please obtain BMP/CBC in one week   Home Health:No Equipment/Devices:None  Discharge Condition:Stable CODE STATUS:Full Diet recommendation: Heart Healthy  Brief/Interim Summary: 68 y.o. male past medical history of SVT paroxysmal atrial fibrillation on Eliquis  BPH schizophrenia alcohol abuse seen twice in the ED for syncope EP saw him and felt to be he was having SVT with coarse A-fib and postconversion bradycardia he was placed on diltiazem  180 mg and 30 mg for breakthrough.  Was discharged and had recurrent syncope so returns to the ED.  It is more like near syncope   Discharge Diagnoses:  Principal Problem:   Syncope Active Problems:   BPH (benign prostatic hyperplasia)   Anemia associated with acute blood loss   Schizophrenia (HCC)  Recurrent near syncope possibly due to atrial tachycardia/atrial fibrillation: EP was consulted recommended to start him on flecainide , continue diltiazem  and Eliquis . He remained stable.  EP relates he probably was not taking his flecainide . Orthostatics were negative. He will need to follow-up with EP in 2 to 4 weeks.  Borderline hypotension: Resolved with IV fluids  Hyperlipidemia chronic send continue statins.  BPH: No medication.  Schizophrenia, Continue risperidone .  Discharge Instructions  Discharge Instructions     Diet - low sodium heart healthy   Complete by: As directed    Increase activity slowly   Complete by: As directed       Allergies as of 02/27/2024   No Known Allergies      Medication List     STOP taking these medications    diltiazem  30 MG tablet Commonly known as: Cardizem        TAKE these medications     atorvastatin  10 MG tablet Commonly known as: LIPITOR Take 10 mg by mouth daily.   diltiazem  120 MG 24 hr capsule Commonly known as: CARDIZEM  CD Take 1 capsule (120 mg total) by mouth daily. What changed:  medication strength how much to take   Eliquis  5 MG Tabs tablet Generic drug: apixaban  Take 1 tablet (5 mg total) by mouth 2 (two) times daily.   ferrous sulfate 325 (65 FE) MG EC tablet Take 325 mg by mouth daily with breakfast.   FISH OIL PO Take 1 capsule by mouth daily.   flecainide  50 MG tablet Commonly known as: TAMBOCOR  Take 1 tablet (50 mg total) by mouth 2 (two) times daily.   Multivitamin Adult Tabs Take 1 tablet by mouth daily.   risperiDONE  2 MG tablet Commonly known as: RISPERDAL  Take 2 mg by mouth 2 (two) times daily.   VITAMIN B-12 PO Take 1 tablet by mouth daily.        No Known Allergies  Consultations: EP  Procedures/Studies: No results found. (Echo, Carotid, EGD, Colonoscopy, ERCP)    Subjective:   Discharge Exam: Vitals:   02/27/24 0429 02/27/24 0840  BP: 102/66 106/68  Pulse: (!) 41   Resp: 16 16  Temp: 98.3 F (36.8 C) 98.2 F (36.8 C)  SpO2: 96%    Vitals:   02/26/24 2005 02/26/24 2343 02/27/24 0429 02/27/24 0840  BP: 133/74 106/71 102/66 106/68  Pulse: (!) 48 (!) 48 (!) 41   Resp: 16 18 16 16   Temp: 98.7 F (37.1 C) 98.3 F (36.8 C) 98.3 F (36.8 C)  98.2 F (36.8 C)  TempSrc: Oral Oral Oral Oral  SpO2: 100% 100% 96%   Weight:      Height:        General: Pt is alert, awake, not in acute distress Cardiovascular: RRR, S1/S2 +, no rubs, no gallops Respiratory: CTA bilaterally, no wheezing, no rhonchi Abdominal: Soft, NT, ND, bowel sounds + Extremities: no edema, no cyanosis    The results of significant diagnostics from this hospitalization (including imaging, microbiology, ancillary and laboratory) are listed below for reference.     Microbiology: No results found for this or any previous visit (from  the past 240 hours).   Labs: BNP (last 3 results) No results for input(s): BNP in the last 8760 hours. Basic Metabolic Panel: Recent Labs  Lab 02/25/24 1227 02/25/24 2011 02/26/24 0510  NA 142 143 143  K 4.2 4.2 3.9  CL 111 107 109  CO2 25 26 26   GLUCOSE 86 112* 81  BUN 11 11 11   CREATININE 1.09 1.21 1.20  CALCIUM  9.4 9.6 8.7*  MG  --   --  2.2  PHOS  --   --  4.1   Liver Function Tests: Recent Labs  Lab 02/25/24 1227 02/25/24 2011  AST 22 23  ALT 14 15  ALKPHOS 47 47  BILITOT 1.3* 1.2  PROT 6.4* 7.2  ALBUMIN 3.6 4.0   No results for input(s): LIPASE, AMYLASE in the last 168 hours. No results for input(s): AMMONIA in the last 168 hours. CBC: Recent Labs  Lab 02/25/24 1010 02/25/24 1818  WBC 4.6 4.0  NEUTROABS 3.4 2.7  HGB 12.2* 13.0  HCT 36.0* 36.4*  MCV 100.0 101.7*  PLT 164 169   Cardiac Enzymes: No results for input(s): CKTOTAL, CKMB, CKMBINDEX, TROPONINI in the last 168 hours. BNP: Invalid input(s): POCBNP CBG: No results for input(s): GLUCAP in the last 168 hours. D-Dimer No results for input(s): DDIMER in the last 72 hours. Hgb A1c No results for input(s): HGBA1C in the last 72 hours. Lipid Profile No results for input(s): CHOL, HDL, LDLCALC, TRIG, CHOLHDL, LDLDIRECT in the last 72 hours. Thyroid  function studies Recent Labs    02/26/24 0510  TSH 4.238   Anemia work up No results for input(s): VITAMINB12, FOLATE, FERRITIN, TIBC, IRON, RETICCTPCT in the last 72 hours. Urinalysis    Component Value Date/Time   COLORURINE STRAW (A) 02/25/2024 0956   APPEARANCEUR CLEAR 02/25/2024 0956   LABSPEC 1.006 02/25/2024 0956   PHURINE 6.0 02/25/2024 0956   GLUCOSEU NEGATIVE 02/25/2024 0956   HGBUR NEGATIVE 02/25/2024 0956   BILIRUBINUR NEGATIVE 02/25/2024 0956   KETONESUR NEGATIVE 02/25/2024 0956   PROTEINUR NEGATIVE 02/25/2024 0956   UROBILINOGEN 0.2 05/08/2010 2203   NITRITE NEGATIVE 02/25/2024  0956   LEUKOCYTESUR NEGATIVE 02/25/2024 0956   Sepsis Labs Recent Labs  Lab 02/25/24 1010 02/25/24 1818  WBC 4.6 4.0   Microbiology No results found for this or any previous visit (from the past 240 hours).   Time coordinating discharge: Over 35 minutes  SIGNED:   Erle Odell Castor, MD  [facility] 02/27/2024, 9:33 AM Pager   If 7PM-7AM, please contact night-coverage [email or web address] Password [email or web address]

## 2024-03-10 ENCOUNTER — Ambulatory Visit (HOSPITAL_COMMUNITY)
Admit: 2024-03-10 | Discharge: 2024-03-10 | Disposition: A | Discharge status: Home / Self Care | Attending: Physician Assistant | Admitting: Physician Assistant

## 2024-03-10 VITALS — BP 84/56 | HR 59 | Ht 67.0 in | Wt 166.8 lb

## 2024-03-10 DIAGNOSIS — I4891 Unspecified atrial fibrillation: Secondary | ICD-10-CM

## 2024-03-10 DIAGNOSIS — I480 Paroxysmal atrial fibrillation: Secondary | ICD-10-CM

## 2024-03-10 DIAGNOSIS — I471 Supraventricular tachycardia, unspecified: Secondary | ICD-10-CM | POA: Diagnosis not present

## 2024-03-10 DIAGNOSIS — Z79899 Other long term (current) drug therapy: Secondary | ICD-10-CM | POA: Diagnosis not present

## 2024-03-10 DIAGNOSIS — Z5181 Encounter for therapeutic drug level monitoring: Secondary | ICD-10-CM | POA: Diagnosis not present

## 2024-03-10 MED ORDER — FLECAINIDE ACETATE 100 MG PO TABS: 50.0000 mg | ORAL_TABLET | Freq: Two times a day (BID) | ORAL | 6 refills | Status: DC

## 2024-03-10 MED ORDER — FLECAINIDE ACETATE 100 MG PO TABS: 100.0000 mg | ORAL_TABLET | Freq: Two times a day (BID) | ORAL | 6 refills | Status: AC

## 2024-03-10 NOTE — Addendum Note (Signed)
 Encounter addended by: Janel Nancy SAUNDERS, RN on: 03/10/2024 11:14 AM  Actions taken: Pharmacy for encounter modified, Order list changed

## 2024-03-10 NOTE — Progress Notes (Signed)
 Primary Care Physician: Ransom Other, MD Primary Cardiologist: None Electrophysiologist: Will Gladis Norton, MD  Referring Physician: Dr Cindie [name] is a 68 y.o. male with a history of schizophrenia, alcohol abuse, SVT, atrial fibrillation who presents for follow up in the [facility].  The patient was seen at the ED 02/25/24 with SVT and paroxysmal afib.  Started on flecainide . He went back to the ED later that same day with recurrent syncope. He was initially on amiodarone  but this was changed back to flecainide  prior to discharge. Patient is on Eliquis  for stroke prevention.    Patient presents today for follow up for atrial fibrillation and SVT. He reports that he feels well today despite having frequent SVT. No bleeding issues on the medications. He has not had any interim syncopal episodes.   Today, he denies symptoms of palpitations, chest pain, shortness of breath, orthopnea, PND, lower extremity edema, dizziness, presyncope, syncope, snoring, daytime somnolence, bleeding, or neurologic sequela. The patient is tolerating medications without difficulties and is otherwise without complaint today.    Atrial Fibrillation Risk Factors:  he does not have symptoms or diagnosis of sleep apnea. he does not have a history of rheumatic fever. he does have a history of alcohol use.   Atrial Fibrillation Management history:  Previous antiarrhythmic drugs: flecainide , amiodarone  Previous cardioversions: none Previous ablations: none Anticoagulation history: Eliquis   ROS- All systems are reviewed and negative except as per the HPI above.  Past Medical History:  Diagnosis Date   Anemia    Benign localized prostatic hyperplasia with lower urinary tract symptoms (LUTS)    History of alcohol abuse    History of chest pain    ED visit 01-17-2016 dx unspecified chest pain, GERD, hyponatremia   History of supraventricular tachycardia    ED visit  12-15-2015;  per d/c note by dr kelsie (cardiology) to follow up as outpt for ETT and Echo   History of syncope    ED visit 01-27-2017 w/ LOC-- situational (bowel movement) and vasovagal   Schizophrenia, chronic condition (HCC)     Current Outpatient Medications  Medication Sig Dispense Refill   apixaban  (ELIQUIS ) 5 MG TABS tablet Take 1 tablet (5 mg total) by mouth 2 (two) times daily. 60 tablet 1   atorvastatin  (LIPITOR) 10 MG tablet Take 10 mg by mouth daily.     Cyanocobalamin  (VITAMIN B-12 PO) Take 1 tablet by mouth daily.     diltiazem  (CARDIZEM  CD) 120 MG 24 hr capsule Take 1 capsule (120 mg total) by mouth daily. 30 capsule 0   ferrous sulfate 325 (65 FE) MG EC tablet Take 325 mg by mouth daily with breakfast.     flecainide  (TAMBOCOR ) 50 MG tablet Take 1 tablet (50 mg total) by mouth 2 (two) times daily. (Patient not taking: Reported on 02/26/2024) 60 tablet 1   Multiple Vitamin (MULTIVITAMIN ADULT) TABS Take 1 tablet by mouth daily.     Omega-3 Fatty Acids (FISH OIL PO) Take 1 capsule by mouth daily.     risperiDONE  (RISPERDAL ) 2 MG tablet Take 2 mg by mouth 2 (two) times daily.     No current facility-administered medications for this visit.    Physical Exam: There were no vitals taken for this visit.  GEN: Well nourished, well developed in no acute distress CARDIAC: Regular rate and rhythm with occasional ectopy, no murmurs, rubs, gallops RESPIRATORY:  Clear to auscultation without rales, wheezing or rhonchi  ABDOMEN: Soft, non-tender, non-distended  EXTREMITIES:  No edema; No deformity   Wt Readings from Last 3 Encounters:  02/26/24 73.8 kg  02/25/24 74.8 kg  01/07/24 75.8 kg     EKG today demonstrates  SB, PACs Vent. rate 59 BPM PR interval 178 ms QRS duration 84 ms QT/QTcB 350/346 ms  SVT Vent. rate 163 BPM PR interval * ms QRS duration 86 ms QT/QTcB 252/414 ms   Echo 04/12/23 demonstrated   1. Left ventricular ejection fraction, by estimation, is 60 to  65%. The  left ventricle has normal function. The left ventricle has no regional  wall motion abnormalities. Left ventricular diastolic parameters were  normal.   2. Right ventricular systolic function is normal. The right ventricular  size is normal.   3. The mitral valve is normal in structure. No evidence of mitral valve  regurgitation. No evidence of mitral stenosis.   4. The aortic valve is tricuspid. Aortic valve regurgitation is not  visualized. Aortic valve sclerosis is present, with no evidence of aortic  valve stenosis.   Comparison(s): A prior study was performed on 03/07/2019. LVEF remains  preserved, LA size is now normal, and no pericardial effusion on current  study.     CHA2DS2-VASc Score = 1  The patient's score is based upon: CHF History: 0 HTN History: 0 Diabetes History: 0 Stroke History: 0 Vascular Disease History: 0 Age Score: 1 Gender Score: 0       ASSESSMENT AND PLAN: Paroxysmal Atrial Fibrillation (ICD10:  I48.0) The patient's CHA2DS2-VASc score is 1, indicating a 0.6% annual risk of stroke.   Patient in SR but having frequent episodes of SVT.  Will increase flecainide  to 100 mg BID Continue diltiazem  120 mg daily Continue Eliquis  5 mg BID  High Risk Medication Monitoring (ICD 10: Z79.899) Patient requires ongoing monitoring for anti-arrhythmic medication which has the potential to cause life threatening arrhythmias. Intervals on ECG acceptable for flecainide  monitoring.     SVT Patient briefly in SVT at today's visit, captured on ECG.  D/w Dr Inocencio, increase flecainide  as above and follow up closely with Dr Inocencio to consider ablation.    Follow up with Dr Inocencio at next available appointment.      [facility] [address] [place], [place] [address] [phone]

## 2024-03-10 NOTE — Patient Instructions (Signed)
Increase flecainide to 100mg twice a day 

## 2024-03-11 DIAGNOSIS — R55 Syncope and collapse: Secondary | ICD-10-CM | POA: Diagnosis not present

## 2024-03-21 ENCOUNTER — Other Ambulatory Visit: Payer: Self-pay | Admitting: Cardiology

## 2024-03-21 ENCOUNTER — Other Ambulatory Visit (HOSPITAL_COMMUNITY): Payer: Self-pay

## 2024-03-29 ENCOUNTER — Other Ambulatory Visit (HOSPITAL_COMMUNITY): Payer: Self-pay

## 2024-04-12 ENCOUNTER — Encounter: Payer: Self-pay | Admitting: Physician Assistant

## 2024-04-12 ENCOUNTER — Ambulatory Visit: Admitting: Physician Assistant

## 2024-04-12 VITALS — BP 132/74 | HR 67 | Resp 20 | Ht 67.0 in | Wt 168.0 lb

## 2024-04-12 DIAGNOSIS — R413 Other amnesia: Secondary | ICD-10-CM

## 2024-04-12 MED ORDER — DONEPEZIL HCL 5 MG PO TABS: 5.0000 mg | ORAL_TABLET | Freq: Every day | ORAL | 3 refills | Status: AC

## 2024-04-12 NOTE — Progress Notes (Signed)
 Assessment/Plan:   Memory impairment of unclear etiology   Billy Boyd is a very pleasant 68 y.o. RH male with a history of hypertension, prediabetes, hyperlipidemia, atrial fibrillation, macrocytic anemia, GERD seen today in follow up for memory loss. Memory stable, MMSE today at 20/30.  Discussed starting donepezil, he wants to take his time to decide.   Patient is able to participate on ADLs.  Mood is controlled with psychiatry.     Follow up in 6  months. Start donepezil 5 mg daily, side effects discussed  Recommend good control of her cardiovascular risk factors Continue to control mood as per PCP     Subjective:    This patient is accompanied in the office by his sisters who supplements the history.  Previous records as well as any outside records available were reviewed prior to todays visit. Patient was last seen on 11/26/2023 with [facility] 11/30    Any changes in memory since last visit?  A little bit better. He is more engaged in conversations.  He continues to have issues with short-term memory, especially with new information, conversations and names but better than before.  Continues to be convinced that he has had mind mapping (chip implanted to organize his thoughts).  Long-term memory is good.  He does art work not as much as he should-sister says. Likes going to [employer], enjoys singing.  Enjoys puzzles and even makes his own puzzles. 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 repeats oneself?  Endorsed, not much  Disoriented when walking into a room? Denies    Leaving objects?  May misplace things but not in unusual places   Wandering behavior?  denies    Any personality changes since last visit?  He is more engaged with people than before.  Any worsening depression?:  Denies.  History of schizophrenia. Hallucinations or paranoia?  He hears a voice for him male telling him to come back and talk to him, as before.  Denies visual hallucinations.  He has a  therapist at [facility] he sees every 3 months. Seizures? denies    Any sleep changes? Denies. Denies vivid dreams, REM behavior or sleepwalking   Sleep apnea?   Denies.   Any hygiene concerns?  Denies  Independent of bathing and dressing?  Endorsed  Does the patient needs help with medications?  Patient is in charge, sister helps him setting the trays and monitors   Who is in charge of the finances?  Patient is in charge. Sister helps     Any changes in appetite?  denies     Patient have trouble swallowing? Denies.   Does the patient cook? No Any headaches?   denies   Any vision changes? Denies Chronic back pain  denies   Ambulates with difficulty? Denies.  Likes to walk frequently . Recent falls or head injuries? Denies.     Unilateral weakness, numbness or tingling? denies   Any tremors?  Denies    Any anosmia?  Denies   Any incontinence of urine?  Endorsed, frequency.   Any bowel dysfunction?   Denies      Patient lives sister Billy Boyd   Does the patient drive? Does not drive   Initial visit 3/79/7974    How long did patient have memory difficulties? For the last 1.5 years-he says. Sisters noticed memory issues for the last 4 months. Reports some difficulty remembering new information, conversations and names. There are times that he just does not engage in conversation. He  is convinced that he has mind mapping for the last 7 years ( chip implanted to organize his thoughts) Long-term memory is good. He does Development worker, international aid. Likes to go to [employer]. Enjoys word puzzles and even made his own puzzles.  repeats oneself?  Endorsed Disoriented when walking into a room?  Patient denies.  Leaving objects in unusual places? Denies.   Wandering behavior?  denies .  Any personality changes?  Denies.  He is quieter than he normally would be-sisters say Any history of depression?:  He has a history of schizophrenia, but he is nice and sweet-sister says.   Hallucinations or paranoia? He hears things  from a male voice sometimes, telling himcome back and talk to me. Denies visual hallucinations. He has a therapist at [facility] whom he sees every 3 months  Seizures?  Denies    Any sleep changes?   Sleeps well. Denies vivid dreams, REM behavior or sleepwalking   Sleep apnea?  Denies   Any hygiene concerns?  Denies. Occasionally he needs to be reminded to bathe.   Independent of bathing and dressing?  Endorsed  Does the patient needs help with medications? Patient is in charge   Who is in charge of the finances? Patient is in charge     Any changes in appetite?  Denies     Patient have trouble swallowing? Denies.   Does the patient cook? No      Any kitchen accidents such as leaving the stove on? Denies.   Any history of headaches?   Denies.   Chronic pain ? Denies.   Ambulates with difficulty?  Denies. Likes to walk frequently.   Recent falls or head injuries? 50 years ago he fell of a ladder, no LOC.  Vision changes? Denies.   Any stroke like symptoms? Denies.   Any tremors?   Denies.   Any anosmia?  Denies.   Any incontinence of urine? Denies.   Any bowel dysfunction? Denies.      Patient lives with his sisters  History of heavy alcohol intake? Socially, once a month. HE is concerned that there is a noted diagnosis of alcohol abuse which he adamantly denies, so do his sisters.  History of heavy tobacco use? Denies.   Family history of dementia? Denies.  Does patient drive? Yes  He had different jobs, in the past he worked on the Nurse, adult. He works at [employer].   MRI of the brain June 2025 without acute findings, age-related changes were noted.  Mild to moderate chronic small vessel ischemic changes.  PREVIOUS MEDICATIONS:   CURRENT MEDICATIONS:  Outpatient Encounter Medications as of 04/12/2024  Medication Sig   donepezil (ARICEPT) 5 MG tablet Take 1 tablet (5 mg total) by mouth daily.   apixaban  (ELIQUIS ) 5 MG TABS tablet Take 1 tablet (5 mg total) by mouth 2 (two)  times daily.   atorvastatin  (LIPITOR) 10 MG tablet Take 10 mg by mouth daily.   Cyanocobalamin  (VITAMIN B-12 PO) Take 1 tablet by mouth daily. (Patient taking differently: Take 1 tablet by mouth as needed.)   diltiazem  (CARDIZEM  CD) 120 MG 24 hr capsule Take 1 capsule (120 mg total) by mouth daily.   ferrous sulfate 325 (65 FE) MG EC tablet Take 325 mg by mouth daily with breakfast.   flecainide  (TAMBOCOR ) 100 MG tablet Take 1 tablet (100 mg total) by mouth 2 (two) times daily.   Multiple Vitamin (MULTIVITAMIN ADULT) TABS Take 1 tablet by mouth daily. (Patient taking differently: Take 1 tablet  by mouth as needed.)   Omega-3 Fatty Acids (FISH OIL PO) Take 1 capsule by mouth daily. (Patient taking differently: Take 1 capsule by mouth as needed.)   risperiDONE  (RISPERDAL ) 2 MG tablet Take 2 mg by mouth 2 (two) times daily.   No facility-administered encounter medications on file as of 04/12/2024.       04/12/2024   12:00 PM  MMSE - Mini Mental State Exam  Orientation to time 1  Orientation to Place 4  Registration 3  Attention/ Calculation 4  Recall 0  Language- name 2 objects 2  Language- repeat 1  Language- follow 3 step command 3  Language- read & follow direction 1  Write a sentence 0  Copy design 1  Total score 20      11/26/2023    5:00 PM  Montreal Cognitive Assessment   Visuospatial/ Executive (0/5) 1  Naming (0/3) 2  Attention: Read list of digits (0/2) 1  Attention: Read list of letters (0/1) 1  Attention: Serial 7 subtraction starting at 100 (0/3) 1  Language: Repeat phrase (0/2) 1  Language : Fluency (0/1) 1  Abstraction (0/2) 0  Delayed Recall (0/5) 0  Orientation (0/6) 3  Total 11  Adjusted Score (based on education) 11    Objective:     PHYSICAL EXAMINATION:    VITALS:   Vitals:   04/12/24 1043  BP: 132/74  Pulse: 67  Resp: 20  SpO2: 100%  Weight: 168 lb (76.2 kg)  Height: 5' 7 (1.702 m)    GEN:  The patient appears stated age and is in  NAD. HEENT:  Normocephalic, atraumatic.   Neurological examination:  General: NAD, well-groomed, appears stated age. Orientation: The patient is alert. Oriented to person, place and not to date Cranial nerves: There is good facial symmetry. Flat affect.  The speech is fluent and clear. No aphasia or dysarthria. Fund of knowledge is appropriate. Recent and remote memory are impaired. Attention and concentration are reduced. Able to name objects and repeat phrases.  Hearing is intact to conversational tone.   Sensation: Sensation is intact to light touch throughout Motor: Strength is at least antigravity x4. DTR's 2/4 in UE/LE     Movement examination: Tone: There is normal tone in the UE/LE Abnormal movements:  no tremor.  No myoclonus.  No asterixis.   Coordination:  There is no decremation with RAM's. Normal finger to nose  Gait and Station: The patient has no difficulty arising out of a deep-seated chair without the use of the hands. The patient's stride length is good.  Gait is cautious and narrow.    Thank you for allowing us  the opportunity to participate in the care of this nice patient. Please do not hesitate to contact us  for any questions or concerns.   Total time spent on today's visit was 40 minutes dedicated to this patient today, preparing to see patient, examining the patient, ordering tests and/or medications and counseling the patient, documenting clinical information in the EHR or other health record, independently interpreting results and communicating results to the patient/family, discussing treatment and goals, answering patient's questions and coordinating care.  Cc:  Ransom Other, MD  Camie Sevin 04/12/2024 12:16 PM

## 2024-04-12 NOTE — Patient Instructions (Signed)
 It was a pleasure to see you today at our office.   Recommendations:   Start donepezil 5 mg daily  Follow up on April 28 at 11:30      [email or web address]   RECOMMENDATIONS FOR ALL PATIENTS WITH MEMORY PROBLEMS: 1. Continue to exercise (Recommend 30 minutes of walking everyday, or 3 hours every week) 2. Increase social interactions - continue going to [place] and enjoy social gatherings with friends and family 3. Eat healthy, avoid fried foods and eat more fruits and vegetables 4. Maintain adequate blood pressure, blood sugar, and blood cholesterol level. Reducing the risk of stroke and cardiovascular disease also helps promoting better memory. 5. Avoid stressful situations. Live a simple life and avoid aggravations. Organize your time and prepare for the next day in anticipation. 6. Sleep well, avoid any interruptions of sleep and avoid any distractions in the bedroom that may interfere with adequate sleep quality 7. Avoid sugar, avoid sweets as there is a strong link between excessive sugar intake, diabetes, and cognitive impairment We discussed the Mediterranean diet, which has been shown to help patients reduce the risk of progressive memory disorders and reduces cardiovascular risk. This includes eating fish, eat fruits and green leafy vegetables, nuts like almonds and hazelnuts, walnuts, and also use olive oil. Avoid fast foods and fried foods as much as possible. Avoid sweets and sugar as sugar use has been linked to worsening of memory function.  There is always a concern of gradual progression of memory problems. If this is the case, then we may need to adjust level of care according to patient needs. Support, both to the patient and caregiver, should then be put into place.       FALL PRECAUTIONS: Be cautious when walking. Scan the area for obstacles that may increase the risk of trips and falls. When getting up in the  mornings, sit up at the edge of the bed for a few minutes before getting out of bed. Consider elevating the bed at the head end to avoid drop of blood pressure when getting up. Walk always in a well-lit room (use night lights in the walls). Avoid area rugs or power cords from appliances in the middle of the walkways. Use a walker or a cane if necessary and consider physical therapy for balance exercise. Get your eyesight checked regularly.  FINANCIAL OVERSIGHT: Supervision, especially oversight when making financial decisions or transactions is also recommended.  HOME SAFETY: Consider the safety of the kitchen when operating appliances like stoves, microwave oven, and blender. Consider having supervision and share cooking responsibilities until no longer able to participate in those. Accidents with firearms and other hazards in the house should be identified and addressed as well.   ABILITY TO BE LEFT ALONE: If patient is unable to contact 911 operator, consider using LifeLine, or when the need is there, arrange for someone to stay with patients. Smoking is a fire hazard, consider supervision or cessation. Risk of wandering should be assessed by caregiver and if detected at any point, supervision and safe proof recommendations should be instituted.  MEDICATION SUPERVISION: Inability to self-administer medication needs to be constantly addressed. Implement a mechanism to ensure safe administration of the medications.      Mediterranean Diet A Mediterranean diet refers to food and lifestyle choices that are based on the traditions of countries located on the [employer] [place]. This way of eating has been shown to help prevent certain conditions and improve outcomes for people who have chronic diseases, like kidney  disease and heart disease. What are tips for following this plan? Lifestyle  Cook and eat meals together with your family, when possible. Drink enough fluid to keep your urine clear or  pale yellow. Be physically active every day. This includes: Aerobic exercise like running or swimming. Leisure activities like gardening, walking, or housework. Get 7-8 hours of sleep each night. If recommended by your health care provider, drink red wine in moderation. This means 1 glass a day for nonpregnant women and 2 glasses a day for men. A glass of wine equals 5 oz (150 mL). Reading food labels  Check the serving size of packaged foods. For foods such as rice and pasta, the serving size refers to the amount of cooked product, not dry. Check the total fat in packaged foods. Avoid foods that have saturated fat or trans fats. Check the ingredients list for added sugars, such as corn syrup. Shopping  At the grocery store, buy most of your food from the areas near the walls of the store. This includes: Fresh fruits and vegetables (produce). Grains, beans, nuts, and seeds. Some of these may be available in unpackaged forms or large amounts (in bulk). Fresh seafood. Poultry and eggs. Low-fat dairy products. Buy whole ingredients instead of prepackaged foods. Buy fresh fruits and vegetables in-season from local farmers markets. Buy frozen fruits and vegetables in resealable bags. If you do not have access to quality fresh seafood, buy precooked frozen shrimp or canned fish, such as tuna, salmon, or sardines. Buy small amounts of raw or cooked vegetables, salads, or olives from the deli or salad bar at your store. Stock your pantry so you always have certain foods on hand, such as olive oil, canned tuna, canned tomatoes, rice, pasta, and beans. Cooking  Cook foods with extra-virgin olive oil instead of using butter or other vegetable oils. Have meat as a side dish, and have vegetables or grains as your main dish. This means having meat in small portions or adding small amounts of meat to foods like pasta or stew. Use beans or vegetables instead of meat in common dishes like chili or  lasagna. Experiment with different cooking methods. Try roasting or broiling vegetables instead of steaming or sauteing them. Add frozen vegetables to soups, stews, pasta, or rice. Add nuts or seeds for added healthy fat at each meal. You can add these to yogurt, salads, or vegetable dishes. Marinate fish or vegetables using olive oil, lemon juice, garlic, and fresh herbs. Meal planning  Plan to eat 1 vegetarian meal one day each week. Try to work up to 2 vegetarian meals, if possible. Eat seafood 2 or more times a week. Have healthy snacks readily available, such as: Vegetable sticks with hummus. Greek yogurt. Fruit and nut trail mix. Eat balanced meals throughout the week. This includes: Fruit: 2-3 servings a day Vegetables: 4-5 servings a day Low-fat dairy: 2 servings a day Fish, poultry, or lean meat: 1 serving a day Beans and legumes: 2 or more servings a week Nuts and seeds: 1-2 servings a day Whole grains: 6-8 servings a day Extra-virgin olive oil: 3-4 servings a day Limit red meat and sweets to only a few servings a month What are my food choices? Mediterranean diet Recommended Grains: Whole-grain pasta. Brown rice. Bulgar wheat. Polenta. Couscous. Whole-wheat bread. [place] Madeira. Vegetables: Artichokes. Beets. Broccoli. Cabbage. Carrots. Eggplant. Green beans. Chard. Kale. Spinach. Onions. Leeks. Peas. Squash. Tomatoes. Peppers. Radishes. Fruits: Apples. Apricots. Avocado. Berries. Bananas. Cherries. Dates. Figs. Grapes. Lemons. Melon.  Oranges. Peaches. Plums. Pomegranate. Meats and other protein foods: Beans. Almonds. Sunflower seeds. Pine nuts. Peanuts. Cod. Salmon. Scallops. Shrimp. Tuna. Tilapia. Clams. Oysters. Eggs. Dairy: Low-fat milk. Cheese. Greek yogurt. Beverages: Water. Red wine. Herbal tea. Fats and oils: Extra virgin olive oil. Avocado oil. Grape seed oil. Sweets and desserts: [place] yogurt with honey. Baked apples. Poached pears. Trail mix. Seasoning and  other foods: Basil. Cilantro. Coriander. Cumin. Mint. Parsley. Sage. Rosemary. Tarragon. Garlic. Oregano. Thyme. Pepper. Balsalmic vinegar. Tahini. Hummus. Tomato sauce. Olives. Mushrooms. Limit these Grains: Prepackaged pasta or rice dishes. Prepackaged cereal with added sugar. Vegetables: Deep fried potatoes (french fries). Fruits: Fruit canned in syrup. Meats and other protein foods: Beef. Pork. Lamb. Poultry with skin. Hot dogs. [place]. Dairy: Ice cream. Sour cream. Whole milk. Beverages: Juice. Sugar-sweetened soft drinks. Beer. Liquor and spirits. Fats and oils: Butter. Canola oil. Vegetable oil. Beef fat (tallow). Lard. Sweets and desserts: Cookies. Cakes. Pies. Candy. Seasoning and other foods: Mayonnaise. Premade sauces and marinades. The items listed may not be a complete list. Talk with your dietitian about what dietary choices are right for you. Summary The Mediterranean diet includes both food and lifestyle choices. Eat a variety of fresh fruits and vegetables, beans, nuts, seeds, and whole grains. Limit the amount of red meat and sweets that you eat. Talk with your health care provider about whether it is safe for you to drink red wine in moderation. This means 1 glass a day for nonpregnant women and 2 glasses a day for men. A glass of wine equals 5 oz (150 mL). This information is not intended to replace advice given to you by your health care provider. Make sure you discuss any questions you have with your health care provider. Document Released: 02/07/2016 Document Revised: 03/11/2016 Document Reviewed: 02/07/2016 Elsevier Interactive Patient Education  2017 [employer].

## 2024-04-17 ENCOUNTER — Other Ambulatory Visit: Payer: Self-pay | Admitting: Physician Assistant

## 2024-04-19 NOTE — Progress Notes (Unsigned)
  Electrophysiology Office Note:   Date:  04/21/2024  ID:  Billy Boyd, DOB 1956-04-04, MRN [id number]  Primary Cardiologist: None Primary Heart Failure: None Electrophysiologist: Reco Shonk Gladis Norton, MD      History of Present Illness:   Billy Boyd is a 68 y.o. male with h/o schizophrenia, alcohol abuse, SVT, atrial fibrillation seen today for routine electrophysiology followup.   He presented emergency room 02/25/2024 with SVT and atrial fibrillation.  He was started on flecainide .  He had recurrent syncope and Billy Boyd presented to the emergency room.  He was initially started on amiodarone  but this stopped and he was resumed on his flecainide .  He is on Eliquis  for stroke prevention.  Since last being seen in our clinic the patient reports doing well.  Send starting flecainide , he has had no further arrhythmias.  He is happy with his control.  he denies chest pain, palpitations, dyspnea, PND, orthopnea, nausea, vomiting, dizziness, syncope, edema, weight gain, or early satiety.   Review of systems complete and found to be negative unless listed in HPI.   EP Information / Studies Reviewed:    EKG is ordered today. Personal review as below.        Risk Assessment/Calculations:    CHA2DS2-VASc Score = 1  EKG 8 this indicates a 0.6% annual risk of stroke. The patient's score is based upon: CHF History: 0 HTN History: 0 Diabetes History: 0 Stroke History: 0 Vascular Disease History: 0 Age Score: 1 Gender Score: 0             Physical Exam:   VS:  BP (!) 98/54   Pulse 64   Ht 5' 7 (1.702 m)   SpO2 98%   BMI 26.31 kg/m    Wt Readings from Last 3 Encounters:  04/12/24 168 lb (76.2 kg)  03/10/24 166 lb 12.8 oz (75.7 kg)  02/26/24 162 lb 9.6 oz (73.8 kg)     GEN: Well nourished, well developed in no acute distress NECK: No JVD; No carotid bruits CARDIAC: Regular rate and rhythm, no murmurs, rubs, gallops RESPIRATORY:  Clear to auscultation without rales, wheezing  or rhonchi  ABDOMEN: Soft, non-tender, non-distended EXTREMITIES:  No edema; No deformity   ASSESSMENT AND PLAN:    1.  Paroxysmal atrial fibrillation: On flecainide , diltiazem , Eliquis .  He is feeling well.  He has no acute complaints.  He remains in sinus rhythm.  2.  High risk medication monitoring: On flecainide .  QRS remains narrow.  3.  SVT: Likely due to AVNRT.  He is on flecainide .  He has noted no further arrhythmias.  Billy Boyd continue with current management.  Follow up with EP Team in 6 months  Signed, Elsey Holts Gladis Norton, MD

## 2024-04-20 NOTE — Telephone Encounter (Signed)
 Will send message to [facility], [place] to see if he would like to refill patient's Cardizem  30 mg PRN for high HR.

## 2024-04-21 ENCOUNTER — Ambulatory Visit: Attending: Internal Medicine | Admitting: Cardiology

## 2024-04-21 ENCOUNTER — Encounter: Payer: Self-pay | Admitting: Cardiology

## 2024-04-21 VITALS — BP 98/54 | HR 64 | Ht 67.0 in

## 2024-04-21 DIAGNOSIS — D6869 Other thrombophilia: Secondary | ICD-10-CM

## 2024-04-21 DIAGNOSIS — Z79899 Other long term (current) drug therapy: Secondary | ICD-10-CM

## 2024-04-21 DIAGNOSIS — I471 Supraventricular tachycardia, unspecified: Secondary | ICD-10-CM

## 2024-04-21 DIAGNOSIS — I480 Paroxysmal atrial fibrillation: Secondary | ICD-10-CM

## 2024-04-21 DIAGNOSIS — Z5181 Encounter for therapeutic drug level monitoring: Secondary | ICD-10-CM | POA: Diagnosis not present

## 2024-04-21 NOTE — Patient Instructions (Addendum)
 Medication Instructions:  Your physician recommends that you continue on your current medications as directed. Please refer to the Current Medication list given to you today.  *If you need a refill on your cardiac medications before your next appointment, please call your pharmacy*  Lab Work: None ordered   Testing/Procedures: None ordered  Follow-Up: At [facility], you and your health needs are our priority.  As part of our continuing mission to provide you with exceptional heart care, our providers are all part of one team.  This team includes your primary Cardiologist (physician) and Advanced Practice Providers or APPs (Physician Assistants and Nurse Practitioners) who all work together to provide you with the care you need, when you need it.  Your next appointment:   6 month(s)  Provider:   You will see one of the following Advanced Practice Providers on your designated Care Team:   Charlies Arthur, PA-C Michael Andy Tillery, PA-C Suzann Riddle, NP Daphne Barrack, NP Artist Pouch, PA-C     Thank you for choosing [facility]!!   Maeola Domino, RN [phone]   Other Instructions  Eliquis  assistance [phone]

## 2024-04-22 ENCOUNTER — Telehealth: Payer: Self-pay | Admitting: Pharmacy Technician

## 2024-04-22 NOTE — Telephone Encounter (Signed)
 Sent patient a message to determine where the patient is with their portion of the application.  Not sure if they started it already with the company.

## 2024-05-04 NOTE — Progress Notes (Signed)
 Cardiology Office Note:  .   Date:  05/04/2024  ID:  Billy Boyd, DOB [birth year], MRN [id number] PCP: Billy Other, MD  [facility] Providers Cardiologist:  None Electrophysiologist:  Billy Gladis Norton, MD {  History of Present Illness: .   Billy Boyd is a 68 y.o. male w/PMHx of  syncope (described a vasovagal),  schizophrenia, ETOH abuse,  SVT AFib  He saw Dr. Kelsie 09/19/21, doing well, no reports of symptoms/complaints.  Reports hx of a short RP SVT, pt had not wanted to pursue ablation, as well as asymptomatic sinus bradycardia.  Transitioned to Dr. Norton, saw him 08/11/22, had an ER visit with his SVT treated w/adenosine /vagal maneuver (perhaps triggered by the passing of his sister the day prior) went home with PRN metoprolol . Dr. Norton noted an EKG from the ER with AFib  Given low risk score and low burden, not started on OAC.  Pt continued to want to avoid procedures.  ER visit 04/11/23, dizziness, near syncope, ? If syncope, negative orthostatics, HRs 40's-60s observed, neg Trop, d-dimer, hydrated, briefly admitted, discharged/same day  05/26/23 reported waking with tachycardia, noting elevated HRs when checking his morning vitals. Treated with adenosine  w/ reports of brief interruption of SVT >  dilt gtt with resolution of his SVT Discharged Sister reported that he had not used the PRN metoprolol   I saw him 06/02/23 He is accompanied by his younger sister that he has lived with now for 12 years The 2 ER episodes wioth SVT the only this year When she checks his vitsl, mostly HR 50's-60's, has dipped to the 40's but when he walks around and she checks again is back up. He has not had any recurrent dizzy or lightheaded spells No CP, SOB, DOE They both mention that when he has an exacerbation of hearing voices, is upsetting and has been a trigger for his SVT. He is active, and otherwise feeling well They see his psychologist soon In d/w them, they both  were most comfortable with ongoing watchful waiting  ER 08/14/23 : syncope, hypotensive, SVT 190's > IV dilt > SR + Flu A Seen by cards > PRN dilt dose increased  ER 09/11/23 > ER w/SVT > took his PRN dilt > did eventually stop 09/18/23 > ER > increased burden of SVT, in SVT > adenosine  brief slowing > dilt > SR 09/19/23 > ER w/SVT in d/w EP on call > dilt 120mg  daily  Saw Dr. Norton 10/07/23, on daily dilt > no recurrent SVTs, discussed ablation, pt/sister preferred continuing dilt  I saw him 01/07/24 Reported one short lived episode of fast rate since his last visit No changes were made  Admitted 02/25/24 To ER via EMS with onset of near syncope/syncope, low BP > With EMS patient noted to have afib, possible SVT, sinus bradycardia, recurrent afib.  In the ER was found in in afib with ventricular rates in the low 100s.  EP team consulted Started on eliquis , flecainide  and dilt increased with plans to f/u AFib clinic Discharged same day  Back shortly after discharge with NEAR syncope, denied full LOC. Noted to have paroxysmal SVT into the 180s and with aberrancy and concerns for possible VT given amiodarone .  Had not started flecainide  yet Seen again by EP WCT felt to be aberrancy rec to start flecainide  and observe Was some mention of ? post conversion pause as etiology of his near syncope Discharged 02/27/24  Saw the AFib clinic 03/10/24, reported doing OK, recurrent episodes of SVT  though , his flecainide  dose increased to 100mg  BID  He saw Dr. Inocencio 04/21/24 No recurrent episodes of tachycardia or near syncope, syncope Suspect AVNRT EKG stable Planned 6 mo f/u  Today's visit is scheduled as a 87mo visit ROS:   He is accompanied by his sisters He has not had palpitations, symptoms of AFib/SVT  He is doing really well He is active, busy He denies any kind of CP, SOB, DOE No near syncope or syncope Eliquis  is very expensive, and difficult for them to keep up  with  Arrhythmia/AAD hx Flecainide  started Sept 2025  Studies Reviewed: SABRA    EKG done today and reviewed by myself SB 58bpm, PR , QRS 96ms, QTc  04/12/23: TTE 1. Left ventricular ejection fraction, by estimation, is 60 to 65%. The  left ventricle has normal function. The left ventricle has no regional  wall motion abnormalities. Left ventricular diastolic parameters were  normal.   2. Right ventricular systolic function is normal. The right ventricular  size is normal.   3. The mitral valve is normal in structure. No evidence of mitral valve  regurgitation. No evidence of mitral stenosis.   4. The aortic valve is tricuspid. Aortic valve regurgitation is not  visualized. Aortic valve sclerosis is present, with no evidence of aortic  valve stenosis.    TTE 2020   1. The left ventricle has normal systolic function with an ejection  fraction of 60-65%. The cavity size was normal. Left ventricular diastolic  parameters were normal.   2. The right ventricle has normal systolic function. The cavity was  normal. There is no increase in right ventricular wall thickness. Right  ventricular systolic pressure is normal with an estimated pressure of 24.2  mmHg.   3. Left atrial size was moderately dilated.   4. Trivial pericardial effusion is present.   5. No evidence of mitral valve stenosis.   6. No stenosis of the aortic valve.   7. The aorta is normal unless otherwise noted.    Risk Assessment/Calculations:    Physical Exam:   VS:  There were no vitals taken for this visit.   Wt Readings from Last 3 Encounters:  04/12/24 168 lb (76.2 kg)  03/10/24 166 lb 12.8 oz (75.7 kg)  02/26/24 162 lb 9.6 oz (73.8 kg)    GEN: Well nourished, well developed in no acute distress NECK: No JVD; No carotid bruits CARDIAC: RRR, no murmurs, rubs, gallops RESPIRATORY: CTA b/l without rales, wheezing or rhonchi  ABDOMEN: Soft, non-tender, non-distended EXTREMITIES: No edema; No  deformity    ASSESSMENT AND PLAN: .    SVT Paroxysmal Afib Flecainide  w/dilt, slight increase ins PR though otherwise stable intervals CHA2DS2Vasc remains one for age, on Eliquis , appropriately dosed Low/no burden by symptoms  Looks like or pharmacy team has reached out about paperwork for financial assistance for the Eliquis  > my MA today Billy provide them with copy of the form today I have advised they look into cost of Xarelto They would not want to consider warfarin  Plan ETT  Discussed ablation as an option if fail medical management For now Billy try to avoid invasive procedures  With low risk score > perhaps can discuss stopping OAC if burden of AFib improves    3.  Secondary hypercoagulable state   Dispo:back in 3-4 mo , sooner if needed    Signed, Charlies Macario Arthur, PA-C

## 2024-05-10 ENCOUNTER — Ambulatory Visit: Attending: Physician Assistant | Admitting: Physician Assistant

## 2024-05-10 VITALS — BP 116/78 | HR 58 | Ht 67.0 in | Wt 166.0 lb

## 2024-05-10 DIAGNOSIS — I471 Supraventricular tachycardia, unspecified: Secondary | ICD-10-CM | POA: Diagnosis not present

## 2024-05-10 DIAGNOSIS — D6869 Other thrombophilia: Secondary | ICD-10-CM | POA: Diagnosis not present

## 2024-05-10 DIAGNOSIS — Z79899 Other long term (current) drug therapy: Secondary | ICD-10-CM

## 2024-05-10 DIAGNOSIS — I480 Paroxysmal atrial fibrillation: Secondary | ICD-10-CM

## 2024-05-10 DIAGNOSIS — Z5181 Encounter for therapeutic drug level monitoring: Secondary | ICD-10-CM | POA: Diagnosis not present

## 2024-05-10 NOTE — Patient Instructions (Addendum)
 Medication Instructions:   Your physician recommends that you continue on your current medications as directed. Please refer to the Current Medication list given to you today.  MAKE SURE YOU CHECK THE COST OF XARELTO    *If you need a refill on your cardiac medications before your next appointment, please call your pharmacy*  Lab Work: NONE ORDERED  TODAY   If you have labs (blood work) drawn today and your tests are completely normal, you will receive your results only by: MyChart Message (if you have MyChart) OR A paper copy in the mail If you have any lab test that is abnormal or we need to change your treatment, we will call you to review the results.  Testing/Procedures: Your physician has requested that you have an exercise tolerance test. For further information please visit [email or web address] Please also follow instruction sheet, as given.    Follow-Up: At [facility], you and your health needs are our priority.  As part of our continuing mission to provide you with exceptional heart care, our providers are all part of one team.  This team includes your primary Cardiologist (physician) and Advanced Practice Providers or APPs (Physician Assistants and Nurse Practitioners) who all work together to provide you with the care you need, when you need it.  Your next appointment:   3 -4 month(s)  Provider:  You may see Will Gladis Norton, MD  or  Charlies Arthur, PA-C. ( CONTACT  CASSIE HALL/ ANGELINE HAMMER FOR EP SCHEDULING ISSUES )    We recommend signing up for the patient portal called MyChart.  Sign up information is provided on this After Visit Summary.  MyChart is used to connect with patients for Virtual Visits (Telemedicine).  Patients are able to view lab/test results, encounter notes, upcoming appointments, etc.  Non-urgent messages can be sent to your provider as well.   To learn more about what you can do with MyChart, go to [email or web address].   Other  Instructions

## 2024-05-17 ENCOUNTER — Telehealth: Payer: Self-pay | Admitting: Cardiology

## 2024-05-17 NOTE — Telephone Encounter (Signed)
 Pt c/o medication issue:  1. Name of Medication:  apixaban  (ELIQUIS ) 5 MG TABS tablet  2. How are you currently taking this medication (dosage and times per day)?   3. Are you having a reaction (difficulty breathing--STAT)?   4. What is your medication issue?    Patient's sister says patient is completely out of medication--when completing paperwork for Kasandra Senters she accidentally filled out the PCP information instead of Dr. Inocencio. She says she does not have access to a printer and would like to know if a new form can be printed out at the office. Please advise.

## 2024-05-17 NOTE — Telephone Encounter (Signed)
 LM for patient that new application can be provided. Routed to covering RN and med assistance team

## 2024-05-18 ENCOUNTER — Other Ambulatory Visit (HOSPITAL_COMMUNITY): Payer: Self-pay

## 2024-05-18 NOTE — Telephone Encounter (Signed)
 Mailed application as requested

## 2024-05-18 NOTE — Telephone Encounter (Signed)
 Patient requested another application mailed -request in another encounter- mailed application 05/18/24

## 2024-05-19 NOTE — Telephone Encounter (Signed)
 Faxed patients app to [employer]- uploaded provider portion to media

## 2024-05-23 NOTE — Telephone Encounter (Signed)
 Faxed provider portion to [employer] and scanned in media

## 2024-05-31 ENCOUNTER — Telehealth (HOSPITAL_COMMUNITY): Payer: Self-pay | Admitting: *Deleted

## 2024-05-31 NOTE — Telephone Encounter (Signed)
 Patient given instructions for upcoming stress test.  Argentina Bees, RN

## 2024-05-31 NOTE — Telephone Encounter (Signed)
    Faxed bms updated with collaborating md Will [employer]

## 2024-06-02 ENCOUNTER — Telehealth: Payer: Self-pay | Admitting: Pharmacy Technician

## 2024-06-02 ENCOUNTER — Other Ambulatory Visit (HOSPITAL_COMMUNITY): Payer: Self-pay

## 2024-06-02 ENCOUNTER — Ambulatory Visit (HOSPITAL_COMMUNITY)
Admission: RE | Admit: 2024-06-02 | Discharge: 2024-06-02 | Disposition: A | Source: Ambulatory Visit | Attending: Internal Medicine | Admitting: Internal Medicine

## 2024-06-02 DIAGNOSIS — Z79899 Other long term (current) drug therapy: Secondary | ICD-10-CM | POA: Diagnosis present

## 2024-06-02 DIAGNOSIS — Z5181 Encounter for therapeutic drug level monitoring: Secondary | ICD-10-CM | POA: Diagnosis not present

## 2024-06-02 LAB — EXERCISE TOLERANCE TEST
Angina Index: 0
Duke Treadmill Score: 4
Estimated workload: 5.2
Exercise duration (min): 3 min
Exercise duration (sec): 31 s
MPHR: 152 {beats}/min
Peak HR: 130 {beats}/min
Percent HR: 85 %
Rest HR: 68 {beats}/min
ST Depression (mm): 0 mm

## 2024-06-02 NOTE — Telephone Encounter (Signed)
 Patient was denied [employer] assistance for not meeting 3%  Eliquis  79.15 for 30 days  Xarelto 79.15 for 30 days  Pradaxa not preferred on insurance but 56.66 on goodrx for 30 days   I called the patient and had to leave a message on both numbers

## 2024-06-02 NOTE — Telephone Encounter (Signed)
 SABRA

## 2024-06-03 MED ORDER — APIXABAN 5 MG PO TABS: 5.0000 mg | ORAL_TABLET | Freq: Two times a day (BID) | ORAL | 0 refills | Status: DC

## 2024-06-03 NOTE — Telephone Encounter (Signed)
 Patient's sister notified to pick up samples at [facility] location.

## 2024-06-03 NOTE — Telephone Encounter (Signed)
 Spoke to the sister and she will go to [facility] to get today

## 2024-06-03 NOTE — Addendum Note (Signed)
 Addended by: Isaic Syler on: 06/03/2024 02:24 PM   Modules accepted: Orders

## 2024-06-03 NOTE — Telephone Encounter (Signed)
 Lmom for patient

## 2024-06-03 NOTE — Telephone Encounter (Signed)
 Hi, the patient only has 2 days worth. Can they have some samples while they wait on trying to get extra help through [facility]?      The sister called back and said they do not want him on warfarin. He does not have cardiomyopathy to get the grant. She said he can not afford the eliquis  nor if changed to pradaxa. He makes 13,200 a year. I gave her the number to call to register him for extra help [place]: [facility] [address] [place] [address] [phone] ext. [phone].

## 2024-06-03 NOTE — Telephone Encounter (Signed)
 Eliquis  5 mg samples placed at front for pick up (28 tablets)

## 2024-06-03 NOTE — Telephone Encounter (Signed)
 Patient needing 2 weeks of samples, 5 mg samples available at [facility]. Patient will be notified to pick up samples at [facility] location NLT 4:30 PM.

## 2024-06-06 ENCOUNTER — Ambulatory Visit: Payer: Self-pay | Admitting: Physician Assistant

## 2024-06-15 NOTE — Telephone Encounter (Signed)
 Attempted return phone call.  Left message to contact office at [phone].

## 2024-06-15 NOTE — Telephone Encounter (Signed)
 Patient's sister called to see about receiving more medication (eliquis ) for the pt please advise

## 2024-06-15 NOTE — Telephone Encounter (Signed)
 Left message to call back.

## 2024-06-15 NOTE — Telephone Encounter (Signed)
 PT sister returning call to nurse

## 2024-06-16 ENCOUNTER — Other Ambulatory Visit (HOSPITAL_COMMUNITY): Payer: Self-pay

## 2024-06-16 NOTE — Telephone Encounter (Addendum)
 Jori called back -I was on the other line. I called connie back and gave her the info again. She will call [facility] but in the meantime she wants eliquis  at the [employer] [facility] on insurance  per test claim copay is now 47.00 for 30 days or 141 for 90 days for eliquis  on insurance   I called [facility] [phone] and asked them to fill 30 days per connie request

## 2024-06-16 NOTE — Telephone Encounter (Signed)
 I called and left a message on connie's phone [phone]

## 2024-06-16 NOTE — Telephone Encounter (Signed)
 Left voice message to call back 12/18

## 2024-06-16 NOTE — Telephone Encounter (Signed)
 Pt sister, [name],was returning call and is requesting a callback. Please advise

## 2024-06-16 NOTE — Telephone Encounter (Signed)
 Spoke with Jori, pt's sister per DPR. Pt stated there are multiple sisters that handle the pt's medications and that she is not sure who has spoken to who about what but she was unaware of the information provided by our pharmacy tech earlier this month. Jori was given the number for [employer] [facility] [employer] that [employer] provided. This message will be forwarded to our pharmacy tech for a better explanation for the sister of the benefits available. Sister was also inquiring about more samples as the pt is almost out of his Eliquis .

## 2024-06-16 NOTE — Telephone Encounter (Signed)
 Left message to call back.

## 2024-07-21 ENCOUNTER — Other Ambulatory Visit: Payer: Self-pay | Admitting: Cardiology

## 2024-07-21 MED ORDER — APIXABAN 5 MG PO TABS: 5.0000 mg | ORAL_TABLET | Freq: Two times a day (BID) | ORAL | 5 refills | Status: AC

## 2024-07-21 NOTE — Telephone Encounter (Signed)
 Prescription refill request for Eliquis  received. Indication: afib  Last office visit: [place] 04/21/2024 Scr: 1.20, 02/26/2024 Age: 69 yo  Weight:  75.3 kg   Refill sent.

## 2024-07-22 ENCOUNTER — Other Ambulatory Visit (HOSPITAL_COMMUNITY): Payer: Self-pay

## 2024-09-01 ENCOUNTER — Ambulatory Visit: Admitting: Cardiology

## 2024-10-25 ENCOUNTER — Ambulatory Visit: Admitting: Physician Assistant
